# Patient Record
Sex: Female | Born: 1990 | Race: White | Hispanic: No | Marital: Single
Health system: Southern US, Community
[De-identification: ages and names within clinical notes are randomized; demographics above are authoritative.]

## PROBLEM LIST (undated history)

## (undated) DIAGNOSIS — K649 Unspecified hemorrhoids: Secondary | ICD-10-CM

## (undated) DIAGNOSIS — D649 Anemia, unspecified: Secondary | ICD-10-CM

## (undated) DIAGNOSIS — F32A Depression, unspecified: Secondary | ICD-10-CM

## (undated) DIAGNOSIS — Z8759 Personal history of other complications of pregnancy, childbirth and the puerperium: Secondary | ICD-10-CM

## (undated) DIAGNOSIS — N390 Urinary tract infection, site not specified: Secondary | ICD-10-CM

## (undated) DIAGNOSIS — R519 Headache, unspecified: Secondary | ICD-10-CM

## (undated) DIAGNOSIS — N200 Calculus of kidney: Secondary | ICD-10-CM

## (undated) HISTORY — DX: Depression, unspecified: F32.A

## (undated) HISTORY — DX: Headache, unspecified: R51.9

## (undated) HISTORY — PX: NO PAST SURGERIES: SHX2092

## (undated) HISTORY — DX: Calculus of kidney: N20.0

---
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
## 2010-07-23 ENCOUNTER — Emergency Department (HOSPITAL_COMMUNITY)
Admission: EM | Admit: 2010-07-23 | Discharge: 2010-07-23 | Payer: Self-pay | Source: Home / Self Care | Admitting: Emergency Medicine

## 2010-09-14 LAB — POCT RAPID STREP A (OFFICE): Streptococcus, Group A Screen (Direct): NEGATIVE

## 2010-09-14 LAB — POCT URINALYSIS DIP (DEVICE)
Glucose, UA: NEGATIVE mg/dL
Hgb urine dipstick: NEGATIVE
Ketones, ur: NEGATIVE mg/dL
pH: 7 (ref 5.0–8.0)

## 2010-09-14 LAB — POCT PREGNANCY, URINE: Preg Test, Ur: NEGATIVE

## 2010-10-04 LAB — POCT RAPID STREP A (OFFICE): Streptococcus, Group A Screen (Direct): NEGATIVE

## 2010-11-08 NOTE — Discharge Summary (Signed)
Abigail Gray, SIEGRIST               ACCOUNT NO.:  [id number]   MEDICAL RECORD NO.:  [id number]          PATIENT TYPE:  INP   LOCATION:  6118                         FACILITY:  [facility]   PHYSICIAN:  Deanna Artis. Hickling, M.D.DATE OF BIRTH:  1991/02/22   DATE OF ADMISSION:  02/02/2007  DATE OF DISCHARGE:  02/04/2007                               DISCHARGE SUMMARY   FINAL DIAGNOSES:  1. Complicated migraine (346.20).  2. Obesity (278.00).  3. Migraine without aura (346.10).   PROCEDURE:  MRI scan of the brain under sedation.   COMPLICATIONS:  Apnea. (See Hospital Course),   HOSPITAL COURSE:  The patient is a 20 year old woman who was admitted to  [facility] with less than 1 hour of confusional state  accompanied by severe frontal headache, chest pain, abdominal pain,  vomiting, and confusion that was part of her aphasia.   The patient had a CT scan of the brain which was normal.  Nonetheless,  she was unable to express herself more than brief phrases.  She had a  mild right central seventh and complained of pain all over.   The patient had initial pH of 7.71 because of hyperventilation.  This  exacerbated her cerebral blood flow.   PAST MEDICAL HISTORY:  1. Urinary tract infection.  2. Morbid obesity.   No prior hospitalizations.   She took no medicines and had allergies to medicines.   She low grade temperature of 99.7.  Noted to have tachycardia and  tachypnea. Her general examination was normal.  Neurologic examination  was affected as noted above.  She had no other focal neurologic  deficits.  It was my impression that the patient had a complicated  migraine and not a stroke.   The patient was treated with Reglan 10 mg, Demerol 50 mg, Depacon 1000  mg, Zofran 8 mg, and this did not quell her symptoms.   When she was given Versed, she began to hypoventilate and became apneic.  She was treated with bag and mask ventilation and did not require  intubation.  She  began to breathe on her own again.  She was treated  then with Nembutal and fentanyl and entered a twilight sleep long enough  to perform an MRI scan of the brain without and with contrast that was  normal.   Later that day, her aphasia was gone.  Her headache persisted, frontally  predominant, pounding and stabbing.  It was quelled  by morphine 5 mg  IV, though she had some nausea.  We treated that with Zofran.   I elected to start her on verapamil 40 mg twice daily. Through the day,  she gradually improved. She had two doses of morphine 3:00 p.m. and then  again at 8:00 p.m.  In the evening, she had ibuprofen and 400 mg and has  had no further pain medicine.   Today she is feeling well.  She does not complain of a headache.  Vital  Signs: Blood pressure 90/64, resting pulse 74, respirations 16,  temperature 36.7, oxygen saturation 95%. Her general examination and  neurologic examination are normal.  PLAN:  1. The patient will be discharged on verapamil 40 mg twice a day.  2. She can take ibuprofen as a rescue drug 400 mg every 4 hours.  I      cautioned her not to take it around-the-clock to avoid rebound.  3. She will come to my office to obtain a headache diary which will be      filled out on a daily basis  and sent to me at the end of every      month for analysis.  4. We will see her again in two month's time in the office, sooner      depending upon clinical need.      Deanna Artis. Sharene Skeans, M.D.  Electronically Signed     [facility]/MEDQ  D:  02/04/2007  T:  02/04/2007  Job:  [id number]

## 2010-11-08 NOTE — H&P (Signed)
NAMEDEVYNNE, Abigail Gray               ACCOUNT NO.:  [id number]   MEDICAL RECORD NO.:  [id number]          PATIENT TYPE:  INP   LOCATION:  6118                         FACILITY:  [facility]   PHYSICIAN:  Deanna Artis. Hickling, M.D.DATE OF BIRTH:  15-Sep-1990   DATE OF ADMISSION:  02/02/2007  DATE OF DISCHARGE:                              HISTORY & PHYSICAL   CHIEF COMPLAINT:  Confusion, headaches, vomiting.   HISTORY OF PRESENT ILLNESS:  A 20 year old with known migraine without  aura, history of recurrent urinary tract infections and a histrionic  response to pain.   The patient was awakened this morning complaining of chest pain.  She  was given Advil by her mother and then rather soon after vomited.  She  entered a confusional state around 10:00 a.m.  She was brought to the  [facility] emergency room around 10/10.  She was evaluated by Dr.  Sampson Goon.  The patient was hyperventilating when she arrived.  Call  was placed to me, and she had evidence of carpal pedal spasm.  Call to  me was placed at 10:57.  CT scan of the brain was reviewed by me and is  normal.  The patient is crying, vomiting, unable to get her words out,  with a mild right central seventh, complaining of pain all over but  particularly in the left occipital region, her stomach and her chest.   PAST MEDICAL HISTORY:  Migraines as a pre-teen, urinary tract infection.  The patient had several visits the emergency room from 2005 to 2007 with  this morbid obesity.  She has had no prior hospitalizations and no  surgery.   PAST SURGICAL HISTORY:  None.   BIRTH HISTORY:  Term infant, normal spontaneous vaginal delivery, normal  growth and development.   CURRENT MEDICATIONS:  None.   ALLERGIES:  None.   IMMUNIZATIONS:  Up-to-date.   REVIEW OF SYSTEMS:  A 12-system review is otherwise negative, except as  noted above.   SOCIAL HISTORY:  The patient is a Holiday representative in high school, a good Consulting civil engineer.  No tobacco, alcohol or  drugs.   EXAMINATION:  VITAL SIGNS:  Today temperature 99.7 rectally, blood  pressure 108/72, resting pulse 122, down from 155; respirations 30,  oxygen saturation 100%.  HEENT:  Ear, nose and throat:  No infections, meningismus, bruits.  LUNGS:  Clear.  HEART:  No murmurs.  Pulses normal.  ABDOMEN:  Protuberant.  Bowel sounds normal, nontender.  EXTREMITIES:  Were normal.  NEUROLOGIC:  Examination awake, able to follow commands and able to name  objects.  She is not able to speak in complete sentences.  She speaks in  brief phrases.  Cranial Nerves:  Round reactive pupils.  Visual fields  full to following fingers.  Symmetric facial strength at that time.  There may have been a mild central seventh at rest, but when she  grimaces her face seems equal.  Tongue is midline.  Motor examination:  She moves all four extremities.  She is moving her fingers.  She does  not drift, when I ask her to keep her arms in  the air.  Sensory  examination:  Withdrawal x4.  I can not test extinguishing to visual or  noxious stimuli.  Because of lack of cooperation, deep tendon reflexes  are diminished.  The patient had equivocal right plantar response and  left flexor plantar response.   IMPRESSION:  Confusional migraine 346.20.  I cannot rule out but doubt a  stroke.   PLAN:  Reglan 10 mg IV, Demerol 50 mg IV, Depacon 1000 mg IV.  If that  fails, we will ask Dr. Gerome Sam of pediatric critical care to help  sedate her for an MRI scan, without and with contrast.   Once we can determine the etiology her dysfunction, I can further give  her appropriate pain medicine.  Sometimes Verapamil can be helpful in a  situation like this.   LABORATORY:  Her laboratory studies are as follows:  Sodium 137,  potassium 3.7, chloride 106, BUN 12, creatinine 0.7, glucose 115, venous  pH 7.7, pCO2 15.9, bicarbonate 20.2, hemoglobin 15.6, hematocrit 46.0.  CBC:  White count 14,000, hemoglobin 14.5, hematocrit  41.9, platelet  count 410,000, MCV 83.3, 77 polys, 17 lymphs, 5 monos.  The differential  adds up to 99.  Absolute neutrophil count is 10,800.  PT 13.5, INR 1.0,  PTT 36, D-dimer 0.25, blood acetaminophen less than 10.  Comprehensive  metabolic panel:  Sodium 135, potassium 3.6, chloride 103, CO2 17,  glucose 104, BUN 11, creatinine 0.78, bilirubin 0.8, alkaline  phosphatase 69, AST 30, ALT 23, total protein 8.3, albumin 4.2, calcium  10.2, ethanol less than 5.      Deanna Artis. Sharene Skeans, M.D.  Electronically Signed     [facility]/MEDQ  D:  02/02/2007  T:  02/03/2007  Job:  [id number]   cc:   Marne A. Milinda Antis, MD
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
## 2010-11-14 ENCOUNTER — Inpatient Hospital Stay (INDEPENDENT_AMBULATORY_CARE_PROVIDER_SITE_OTHER)
Admission: RE | Admit: 2010-11-14 | Discharge: 2010-11-14 | Disposition: A | Discharge status: Home / Self Care | Payer: 59 | Source: Ambulatory Visit | Attending: Emergency Medicine | Admitting: Emergency Medicine

## 2010-11-14 ENCOUNTER — Ambulatory Visit (INDEPENDENT_AMBULATORY_CARE_PROVIDER_SITE_OTHER): Payer: 59

## 2010-11-14 ENCOUNTER — Emergency Department (HOSPITAL_COMMUNITY): Payer: 59

## 2010-11-14 ENCOUNTER — Emergency Department (HOSPITAL_COMMUNITY)
Admission: EM | Admit: 2010-11-14 | Discharge: 2010-11-15 | Disposition: A | Discharge status: Home / Self Care | Payer: 59 | Attending: Emergency Medicine | Admitting: Emergency Medicine

## 2010-11-14 DIAGNOSIS — R10815 Periumbilic abdominal tenderness: Secondary | ICD-10-CM

## 2010-11-14 DIAGNOSIS — R110 Nausea: Secondary | ICD-10-CM | POA: Insufficient documentation

## 2010-11-14 DIAGNOSIS — R1033 Periumbilical pain: Secondary | ICD-10-CM | POA: Insufficient documentation

## 2010-11-14 LAB — POCT URINALYSIS DIP (DEVICE)
Bilirubin Urine: NEGATIVE
Hgb urine dipstick: NEGATIVE
Protein, ur: NEGATIVE mg/dL

## 2010-11-14 LAB — URINALYSIS, ROUTINE W REFLEX MICROSCOPIC
Bilirubin Urine: NEGATIVE
Protein, ur: NEGATIVE mg/dL
Specific Gravity, Urine: 1.008 (ref 1.005–1.030)
Urobilinogen, UA: 0.2 mg/dL (ref 0.0–1.0)
pH: 7 (ref 5.0–8.0)

## 2010-11-14 LAB — DIFFERENTIAL
Eosinophils Absolute: 0.1 10*3/uL (ref 0.0–0.7)
Eosinophils Relative: 1 % (ref 0–5)
Lymphocytes Relative: 26 % (ref 12–46)
Lymphs Abs: 2.1 10*3/uL (ref 0.7–4.0)
Monocytes Absolute: 0.5 10*3/uL (ref 0.1–1.0)
Neutro Abs: 5.2 10*3/uL (ref 1.7–7.7)
Neutrophils Relative %: 66 % (ref 43–77)

## 2010-11-14 LAB — COMPREHENSIVE METABOLIC PANEL
ALT: 15 U/L (ref 0–35)
Alkaline Phosphatase: 80 U/L (ref 39–117)
CO2: 24 mEq/L (ref 19–32)
Creatinine, Ser: 0.63 mg/dL (ref 0.4–1.2)
GFR calc Af Amer: >60 mL/min (ref >60)
Potassium: 3.4 mEq/L — ABNORMAL LOW (ref 3.5–5.1)
Sodium: 141 mEq/L (ref 135–145)
Total Bilirubin: 0.1 mg/dL — ABNORMAL LOW (ref 0.3–1.2)

## 2010-11-14 LAB — CBC
HCT: 43.6 % (ref 36.0–46.0)
Hemoglobin: 14.9 g/dL (ref 12.0–15.0)
MCH: 28.9 pg (ref 26.0–34.0)
MCHC: 34.2 g/dL (ref 30.0–36.0)
MCV: 84.7 fL (ref 78.0–100.0)
WBC: 7.9 10*3/uL (ref 4.0–10.5)

## 2010-11-14 LAB — PREGNANCY, URINE: Preg Test, Ur: NEGATIVE

## 2010-11-15 ENCOUNTER — Encounter (HOSPITAL_COMMUNITY): Payer: Self-pay | Admitting: Radiology

## 2010-11-15 LAB — WET PREP, GENITAL

## 2010-11-15 MED ORDER — IOHEXOL 300 MG/ML  SOLN
100.0000 mL | Freq: Once | INTRAMUSCULAR | Status: AC | PRN
  Administered 2010-11-15: 100 mL via INTRAVENOUS

## 2010-11-16 LAB — GC/CHLAMYDIA PROBE AMP, GENITAL: Chlamydia, DNA Probe: NEGATIVE

## 2011-03-17 LAB — POCT URINALYSIS DIP (DEVICE)
Glucose, UA: NEGATIVE
Nitrite: NEGATIVE
Operator id: 116391
Specific Gravity, Urine: 1.015
Urobilinogen, UA: 0.2

## 2011-03-17 LAB — POCT PREGNANCY, URINE
Operator id: 247071
Preg Test, Ur: NEGATIVE

## 2011-03-20 LAB — POCT URINALYSIS DIP (DEVICE)
Ketones, ur: 15 — AB
Operator id: 239701
Protein, ur: 100 — AB
Specific Gravity, Urine: >=1.03
Urobilinogen, UA: 0.2
pH: 6

## 2011-03-27 LAB — TYPE AND SCREEN: Antibody Screen: NEGATIVE

## 2011-03-27 LAB — URINALYSIS, ROUTINE W REFLEX MICROSCOPIC
Glucose, UA: NEGATIVE
Leukocytes, UA: NEGATIVE
Nitrite: NEGATIVE
Protein, ur: 100 — AB
Urobilinogen, UA: 0.2

## 2011-03-27 LAB — PROTIME-INR: INR: 1

## 2011-03-27 LAB — RAPID URINE DRUG SCREEN, HOSP PERFORMED
Amphetamines: NOT DETECTED
Benzodiazepines: NOT DETECTED
Cocaine: NOT DETECTED
Opiates: NOT DETECTED
Tetrahydrocannabinol: POSITIVE — AB

## 2011-03-27 LAB — DIFFERENTIAL
Basophils Absolute: 0
Basophils Absolute: 0
Basophils Relative: 0
Basophils Relative: 0
Lymphocytes Relative: 16 — ABNORMAL LOW
Lymphocytes Relative: 25
Monocytes Absolute: 0.6
Monocytes Relative: 5
Neutro Abs: 6.6
Neutro Abs: 9.8 — ABNORMAL HIGH
Neutrophils Relative %: 78 — ABNORMAL HIGH

## 2011-03-27 LAB — COMPREHENSIVE METABOLIC PANEL
Albumin: 4.3
Alkaline Phosphatase: 66
BUN: 5 — ABNORMAL LOW
Creatinine, Ser: 0.77
Glucose, Bld: 89
Total Protein: 7.9

## 2011-03-27 LAB — CBC
HCT: 41.4
Hemoglobin: 14
MCHC: 33.9
MCV: 90.9
Platelets: 274
Platelets: 318
RDW: 13.1
RDW: 13.5

## 2011-03-27 LAB — URINE MICROSCOPIC-ADD ON

## 2011-04-03 LAB — POCT URINALYSIS DIP (DEVICE)
Glucose, UA: NEGATIVE
Nitrite: POSITIVE — AB
Operator id: 235561
Protein, ur: 30 — AB
Urobilinogen, UA: 0.2

## 2011-04-03 LAB — POCT PREGNANCY, URINE: Preg Test, Ur: NEGATIVE

## 2011-04-03 LAB — URINALYSIS, ROUTINE W REFLEX MICROSCOPIC
Glucose, UA: NEGATIVE
Nitrite: POSITIVE — AB
Protein, ur: NEGATIVE
Urobilinogen, UA: 0.2

## 2011-04-03 LAB — URINE CULTURE: Colony Count: >=100000

## 2011-04-03 LAB — URINE MICROSCOPIC-ADD ON

## 2011-04-06 LAB — POCT URINALYSIS DIP (DEVICE)
Operator id: 247071
Protein, ur: NEGATIVE
Urobilinogen, UA: 0.2

## 2011-04-06 LAB — POCT PREGNANCY, URINE: Preg Test, Ur: NEGATIVE

## 2011-04-07 LAB — POCT URINALYSIS DIP (DEVICE)
Hgb urine dipstick: NEGATIVE
Nitrite: NEGATIVE
Specific Gravity, Urine: 1.025
Urobilinogen, UA: 0.2
pH: 6

## 2011-04-07 LAB — URINE CULTURE: Colony Count: 3000

## 2011-04-10 LAB — DIFFERENTIAL
Eosinophils Relative: 0
Lymphocytes Relative: 17 — ABNORMAL LOW
Lymphs Abs: 2.4
Monocytes Absolute: 0.7
Monocytes Relative: 5
Neutro Abs: 10.8 — ABNORMAL HIGH

## 2011-04-10 LAB — URINE CULTURE: Colony Count: 40000

## 2011-04-10 LAB — POCT I-STAT CREATININE
Creatinine, Ser: 0.7
Operator id: 291361

## 2011-04-10 LAB — ETHANOL: Alcohol, Ethyl (B): <5

## 2011-04-10 LAB — URINALYSIS, ROUTINE W REFLEX MICROSCOPIC
Bilirubin Urine: NEGATIVE
Hgb urine dipstick: NEGATIVE
Nitrite: NEGATIVE
Protein, ur: 100 — AB
Specific Gravity, Urine: 1.021
Urobilinogen, UA: 1

## 2011-04-10 LAB — CBC
MCV: 83.3
Platelets: 410 — ABNORMAL HIGH
RDW: 13.3
WBC: 14 — ABNORMAL HIGH

## 2011-04-10 LAB — COMPREHENSIVE METABOLIC PANEL
AST: 30
Albumin: 4.2
BUN: 11
Chloride: 103
Creatinine, Ser: 0.78
Total Bilirubin: 0.8
Total Protein: 8.3

## 2011-04-10 LAB — I-STAT 8, (EC8 V) (CONVERTED LAB)
Acid-Base Excess: 4 — ABNORMAL HIGH
BUN: 12
Bicarbonate: 20.2
Chloride: 106
Glucose, Bld: 115 — ABNORMAL HIGH
HCT: 46
Hemoglobin: 15.6
Operator id: 291361
Potassium: 3.7
Sodium: 137
TCO2: 21
pCO2, Ven: 15.9 — ABNORMAL LOW
pH, Ven: 7.711

## 2011-04-10 LAB — RAPID URINE DRUG SCREEN, HOSP PERFORMED
Amphetamines: NOT DETECTED
Barbiturates: NOT DETECTED
Benzodiazepines: NOT DETECTED
Tetrahydrocannabinol: NOT DETECTED

## 2011-04-10 LAB — URINE MICROSCOPIC-ADD ON

## 2011-04-10 LAB — PROTIME-INR: INR: 1

## 2011-04-10 LAB — APTT: aPTT: 36

## 2011-11-25 ENCOUNTER — Encounter (HOSPITAL_COMMUNITY): Payer: Self-pay

## 2011-11-25 ENCOUNTER — Emergency Department (HOSPITAL_COMMUNITY)
Admission: EM | Admit: 2011-11-25 | Discharge: 2011-11-25 | Disposition: A | Discharge status: Home / Self Care | Payer: Self-pay | Attending: Emergency Medicine | Admitting: Emergency Medicine

## 2011-11-25 DIAGNOSIS — F17200 Nicotine dependence, unspecified, uncomplicated: Secondary | ICD-10-CM | POA: Insufficient documentation

## 2011-11-25 DIAGNOSIS — N61 Mastitis without abscess: Secondary | ICD-10-CM | POA: Insufficient documentation

## 2011-11-25 MED ORDER — CEPHALEXIN 500 MG PO CAPS: 500.0000 mg | ORAL_CAPSULE | Freq: Four times a day (QID) | ORAL | Status: AC

## 2011-11-25 MED ORDER — HYDROCODONE-ACETAMINOPHEN 5-325 MG PO TABS: 1.0000 | ORAL_TABLET | Freq: Four times a day (QID) | ORAL | Status: AC | PRN

## 2011-11-25 MED ORDER — IBUPROFEN 800 MG PO TABS: 800.0000 mg | ORAL_TABLET | Freq: Three times a day (TID) | ORAL | Status: AC | PRN

## 2011-11-25 NOTE — Discharge Instructions (Signed)
Read the information below.  Take the antibiotics until they are gone, even if your infection is improved. Return to the ER immediately if you develop redness, swelling, pus draining from the wound, or fevers greater than 100.4.  You may return to the ER at any time for worsening condition or any new symptoms that concern you.      If you have no primary doctor, here are some resources that may be helpful:  Medicaid-accepting [facility] Providers:   - [facility]- [address]      [phone]      Mon-Fri 9am-7pm, Sat 9am-1pm   - [facility]- [address] [place], [place]      [phone]   - [facility]- [address] [place]      [phone]   [facility]- [address]      [phone]   - Renaye Rakers- [address] [place], [address]      [phone]      Only accepts [place] patients       after they have her name applied to their card   Self Pay (no insurance) in [place]:   - Sickle Cell Patients: Dr Willey Blade, [facility]      [address] [place]      [phone]   - Health [phone]   - Physician Referral Service- [phone]   - [facility]- [address] [place]      [phone]   [facility] [place]- [address]   - [facility]- see information above      (Speak to [employer] if you do not have insurance)   - [facility]- [address] [place]      [phone]   - [facility] [place]- [address]      [phone]   - [facility]- [address]      [phone]   - Dr Julio Sicks-  [address], [place]      [phone]   - [facility]- [address]      [phone]   - [facility]- [address]      [phone]      Also [address]      [phone]   - [facility]- [address]      [phone]      1st and 3rd Saturday every month, 10am-1pm Other agencies that provide inexpensive medical care:    [facility]  [phone]    [facility]  [phone]    [facility]  [phone]    [facility]  [phone]    [facility]  [phone]  General Information: Finding a doctor when you do not have health insurance can be tricky. Although you are not limited by an insurance plan, you are of course limited by her finances and how much but he can pay out of pocket.  What are your options if you don't have health insurance?   1) Find a Librarian, academic and Pay Out of Pocket Although you won't have to find out who is covered by your insurance plan, it is a good idea to ask around and get recommendations. You will then need to call  the office and see if the doctor you have chosen will accept you as a new patient and what types of options they offer for patients who are self-pay. Some doctors offer discounts or will set up payment plans for their patients who do not have insurance, but you will need to ask so you aren't surprised when you get to your appointment.  2) Contact Your Local Health Department Not all health departments have doctors that can see patients for sick visits, but many do, so it is worth a call to see if yours does. If you don't know where your local health department is, you can check in your phone book. The CDC also has a tool to help you locate your state's health department, and many state websites also have listings of all of their local health departments.  3) Find a Walk-in Clinic If your illness is not likely to be very severe or complicated, you may want to try a walk in clinic. These are popping up all over the country in pharmacies, drugstores, and shopping centers. They're usually staffed by nurse practitioners or physician assistants that have been trained to treat common illnesses and complaints. They're usually  fairly quick and inexpensive. However, if you have serious medical issues or chronic medical problems, these are probably not your best option   RESOURCE GUIDE  Chronic Pain Problems: Contact [facility]  [phone] Patients need to be referred by their primary care doctor.  Insufficient Money for Medicine: Contact United Way:  call "211" or [facility] [phone].  No Primary Care Doctor: - Call [facility] Connect  [phone] - can help you locate a primary care doctor that  accepts your insurance, provides certain services, etc. - Physician Referral [phone]  Agencies that provide inexpensive medical care: - [facility]  [phone] - [facility]  [phone] - [facility]  [phone] [facility]  [phone] - [facility] Parenthood  [phone] [facility]  [phone]  Medicaid-accepting [facility] Providers: - [facility]- [address]  [phone], Mon-Fri 9am-7pm, Sat 9am-1pm - [facility]- [address] [place], [address] [place]  [phone] - [facility]- [address], [address] [place]  [phone] [facility]- [address]  [phone] - Renaye Rakers- [address] [place], [address], [phone]  Only accepts [place] patients after they have their name  applied to their card  Self Pay (no insurance) in [place]: - Sickle Cell Patients: Dr Willey Blade, [facility]  [address] [place], [phone] - [facility]- [address] [place]  [phone]       [facility] [place]- [address]       -     [facility]- see information above (Speak to [employer] if you do not have insurance)       -  [facility]- [address] [place], [phone]       -  [facility]- [address],  [phone]       -  [facility]- [address] [phone]       -  Dr Julio Sicks-  [address], [place], [phone]       -  [facility]-  [address] [phone]       -  [facility]- [address], [phone], also [address] [phone]       -    [facility]- [address] [place], [phone], 1st & 3rd Saturday   every month, 10am-1pm  1) Find a Doctor and Pay Out of Pocket Although you won't have to find out who is covered by your insurance plan, it is a good idea to ask around and get recommendations. You will then need to call the office and see if the doctor you have chosen will accept you as a new patient and what types of options they offer for patients who are self-pay. Some doctors offer discounts or will set up payment plans for their patients who do not have insurance, but you will need to ask so you aren't surprised when you get to your appointment.  2) Contact Your Local Health Department Not all health departments have doctors that can see patients for sick visits, but many do, so it is worth a call to see if yours does. If you don't know where your local health department is, you can check in your phone book. The CDC also has a tool to help you locate your state's health department, and many state websites also have listings of all of their local health departments.  3) Find a Walk-in Clinic If your illness is not likely to be very severe or complicated, you may want to try a walk in clinic. These are popping up all over the country in pharmacies, drugstores, and shopping centers. They're usually staffed by nurse practitioners or physician assistants that have been trained to treat common illnesses and complaints. They're usually fairly quick and inexpensive. However, if you have serious medical issues or chronic medical problems, these are probably not your best option  STD Testing - [facility] Department of [facility],  STD Clinic, [address] [place], phone [phone] or [phone].  Monday - Friday, call for an appointment. [facility] Department of [employer], STD Clinic, [address], [place], phone [phone] or [phone].  Monday - Friday, call for an appointment.  Abuse/Neglect: [facility] Child Abuse Hotline [phone] [facility] Child Abuse Hotline 862-170-2554 (After Hours)  Emergency Shelter:  [facility] (437)063-1567  Maternity Homes: - [facility] [place] [facility] [phone] - [facility] (435)[phone]  MRSA Hotline #:   413-877-2703  [facility] Resources  [facility]  United [employer] [facility] Dept. [address]                 [address]         [address] [place] [address]  [place]                                               Cristobal Goldmann Phone:  [phone]  Phone:  [phone]                   Phone:  [phone]  [facility], [phone] - [phone]       -     [facility] [place] in [place], [address],                                  [phone], [facility] Child Abuse Hotline 336-203-7024 or 8648655926 (After Hours)   Behavioral Health Services  Substance Abuse Resources: - [facility] Drug [facility]  (832)261-8273 - [facility] (206)178-7235 - [facility] [place] [phone] Floydene Flock 813-390-2020 - Residential & Outpatient Substance Abuse Program  [phone]  Psychological Services: [facility]  [phone] [facility]  [phone] - [facility], [phone] [place]. [address] [place], ACCESS LINE: 801-852-6246 or [phone], [employer]  Dental  Assistance  If unable to pay or uninsured, contact:  [facility] or [facility]. to become qualified for the adult dental clinic.  Patients with Medicaid: [facility] [phone] [address], [phone] [address] [phone]  If unable to pay, or uninsured, contact [facility] [phone]) or [facility] Department [phone] in [place], [phone] in [facility]) to become qualified for the adult dental clinic  Other Low-Cost Community Dental Services: - [facility]- [address] [place], [place], [place], [address], [phone], [address], 2nd and 4th Thursday of the month at 6:30am.  10 clients each day by appointment, can sometimes see walk-in patients if someone does not show for an appointment. [facility]- [address] [place], [place], [address], [phone] - [facility]- [address] [place], [place], [address], [phone] - [facility]- [phone] [facility]- [phone] [facility] Department- [phone]

## 2011-11-25 NOTE — ED Notes (Signed)
sts knot on skin under left breast for 3 weeks

## 2011-11-25 NOTE — ED Provider Notes (Signed)
History   This chart was scribed for Hilario Quarry, MD by Toya Smothers. The patient was seen in room STRE2/STRE2. Patient's care was started at 1818.  CSN: [id number]  Arrival date & time 11/25/11  1818   None     Chief Complaint  Patient presents with  . Cyst   The history is provided by the patient. No language interpreter was used.    Abigail Gray is a 21 y.o. female who presents to the Emergency Department complaining of sudden onset constant moderate severe knott on left breast onset 3 weeks ago with associate rash onset 1 day ago. Pt states that the knott appeared suddenly. Pt states that she is sexually active, not taking birth control, currently not taking any other OTC drugs or prescribed medications.   History reviewed. No pertinent past medical history.  History reviewed. No pertinent past surgical history.  History reviewed. No pertinent family history.  History  Substance Use Topics  . Smoking status: Current Everyday Smoker  . Smokeless tobacco: Not on file  . Alcohol Use: No   Review of Systems  Constitutional: Negative for fever and chills.  Respiratory: Negative for shortness of breath.   Gastrointestinal: Negative for nausea and vomiting.  Musculoskeletal:       Tenderness L breast.  Skin:       Knott under L breast.  Neurological: Negative for weakness.    Allergies  Shellfish allergy  Home Medications  No current outpatient prescriptions on file.  BP 144/78  Pulse 81  Temp 98.5 F (36.9 C)  Resp 16  SpO2 99%  Physical Exam  Nursing note and vitals reviewed. Constitutional: She is oriented to person, place, and time. She appears well-developed and well-nourished. No distress.  HENT:  Head: Normocephalic and atraumatic.  Eyes: EOM are normal. Pupils are equal, round, and reactive to light.  Neck: Neck supple. No tracheal deviation present.  Cardiovascular: Normal rate.   Pulmonary/Chest: Effort normal. No respiratory distress.    Abdominal: Soft. She exhibits no distension.  Musculoskeletal: Normal range of motion. She exhibits no edema.       Abscess on L brest   Neurological: She is alert and oriented to person, place, and time. No sensory deficit.  Skin: Skin is warm and dry.  Psychiatric: She has a normal mood and affect. Her behavior is normal.    ED Course  Procedures (including critical care time) DIAGNOSTIC STUDIES: Oxygen Saturation is 99% on room air, normal by my interpretation.    COORDINATION OF CARE: 7:06PM- Evaluated state of Pt's present illness.  WEST,EMILY B Patient was moved to CDU for I&D of left breast abscess.  Sign out received from Dr Rosalia Hammers.  Patient to have I&D in ED, abscess felt superficially by Dr Rosalia Hammers.  On my exam, pt does have small area of induration and surrounding erythema. Discussed risks and alternatives with patient, including large scarring to result.  Pt decided to proceed with I&D as she does not have insurance and will not be able to follow up with surgery.   INCISION AND DRAINAGE Performed by: Rise Patience Consent: Verbal consent obtained. Risks and benefits: risks, benefits and alternatives were discussed Type: abscess  Body area: left breast  Anesthesia: local infiltration  Local anesthetic: lidocaine 2% no epinephrine  Anesthetic total: 5 ml  Drainage: sanguinous  Drainage amount: small  Packing material: none.    No apparent collection underneath.  Wound edges approximated with steri-strips.    Patient tolerance: Patient tolerated  the procedure well with no immediate complications.  Given lack of evidence of any collection of purulent material or abscess within the breast, I chose to close the incision with steri-strips to prevent gaping wound or worsening infection.  Wound care instructions discussed with patient.  Return precautions discussed with patient.  Patient verbalizes understanding and agrees with plan.    End section by WEST,EMILY B      Labs Reviewed - No data to display No results found.  No diagnosis found.   MDM  I personally performed the services described in this documentation, which was scribed in my presence. The recorded information has been reviewed and considered.  I performed a history and physical examination of Abigail Gray and discussed her management with Ms. Chad .  I agree with the history, physical, assessment, and plan of care, with the following exceptions: None  I was present for the following procedures: None Time Spent in Critical Care of the patient: None Time spent in discussions with the patient and family:10 Abigail Honda [place] Leak, MD 12/02/11 1755

## 2011-12-01 ENCOUNTER — Emergency Department (HOSPITAL_COMMUNITY): Payer: Self-pay

## 2011-12-01 ENCOUNTER — Encounter (HOSPITAL_COMMUNITY): Payer: Self-pay | Admitting: Emergency Medicine

## 2011-12-01 ENCOUNTER — Emergency Department (HOSPITAL_COMMUNITY)
Admission: EM | Admit: 2011-12-01 | Discharge: 2011-12-01 | Disposition: A | Discharge status: Home / Self Care | Payer: Self-pay | Attending: Emergency Medicine | Admitting: Emergency Medicine

## 2011-12-01 DIAGNOSIS — R112 Nausea with vomiting, unspecified: Secondary | ICD-10-CM | POA: Insufficient documentation

## 2011-12-01 DIAGNOSIS — R109 Unspecified abdominal pain: Secondary | ICD-10-CM

## 2011-12-01 DIAGNOSIS — R197 Diarrhea, unspecified: Secondary | ICD-10-CM | POA: Insufficient documentation

## 2011-12-01 DIAGNOSIS — R1031 Right lower quadrant pain: Secondary | ICD-10-CM | POA: Insufficient documentation

## 2011-12-01 DIAGNOSIS — N61 Mastitis without abscess: Secondary | ICD-10-CM | POA: Insufficient documentation

## 2011-12-01 LAB — COMPREHENSIVE METABOLIC PANEL
BUN: 10 mg/dL (ref 6–23)
Calcium: 9.5 mg/dL (ref 8.4–10.5)
GFR calc Af Amer: >90 mL/min (ref >90)
Glucose, Bld: 94 mg/dL (ref 70–99)
Sodium: 139 mEq/L (ref 135–145)
Total Protein: 8 g/dL (ref 6.0–8.3)

## 2011-12-01 LAB — CBC
HCT: 42.5 % (ref 36.0–46.0)
Hemoglobin: 14.6 g/dL (ref 12.0–15.0)
MCH: 29 pg (ref 26.0–34.0)
MCHC: 34.4 g/dL (ref 30.0–36.0)

## 2011-12-01 LAB — URINALYSIS, ROUTINE W REFLEX MICROSCOPIC
Ketones, ur: NEGATIVE mg/dL
Leukocytes, UA: NEGATIVE
Nitrite: NEGATIVE
Protein, ur: NEGATIVE mg/dL

## 2011-12-01 LAB — LIPASE, BLOOD: Lipase: 30 U/L (ref 11–59)

## 2011-12-01 MED ORDER — ONDANSETRON HCL 4 MG/2ML IJ SOLN: 4.0000 mg | Freq: Once | INTRAMUSCULAR | Status: AC

## 2011-12-01 MED ORDER — IOHEXOL 300 MG/ML  SOLN
20.0000 mL | INTRAMUSCULAR | Status: AC
  Administered 2011-12-01 (×2): 20 mL via ORAL

## 2011-12-01 MED ORDER — SODIUM CHLORIDE 0.9 % IV BOLUS (SEPSIS)
1000.0000 mL | Freq: Once | INTRAVENOUS | Status: AC
  Administered 2011-12-01: 1000 mL via INTRAVENOUS

## 2011-12-01 MED ORDER — PROMETHAZINE HCL 25 MG PO TABS: 25.0000 mg | ORAL_TABLET | Freq: Four times a day (QID) | ORAL | Status: DC | PRN

## 2011-12-01 MED ORDER — IOHEXOL 300 MG/ML  SOLN
100.0000 mL | Freq: Once | INTRAMUSCULAR | Status: AC | PRN
  Administered 2011-12-01: 100 mL via INTRAVENOUS

## 2011-12-01 MED ORDER — HYDROMORPHONE HCL PF 1 MG/ML IJ SOLN: 1.0000 mg | Freq: Once | INTRAMUSCULAR | Status: DC

## 2011-12-01 MED ORDER — ONDANSETRON HCL 4 MG/2ML IJ SOLN
4.0000 mg | Freq: Once | INTRAMUSCULAR | Status: AC
  Administered 2011-12-01: 4 mg via INTRAVENOUS
  Filled 2011-12-01: qty 2

## 2011-12-01 MED ORDER — HYDROMORPHONE HCL PF 1 MG/ML IJ SOLN
1.0000 mg | Freq: Once | INTRAMUSCULAR | Status: AC
  Administered 2011-12-01: 1 mg via INTRAVENOUS
  Filled 2011-12-01: qty 1

## 2011-12-01 NOTE — ED Provider Notes (Signed)
History     CSN: [id number]  Arrival date & time 12/01/11  0011   First MD Initiated Contact with Patient 12/01/11 0051      Chief Complaint  Patient presents with  . Emesis     The history is provided by the patient and medical records.   the patient was recently seen for left superior breast abscess and cellulitis status post incision and drainage and antibiotics.  She reports significant improvement in her erythema and no discomfort in her left breast.  She presents today because of severe nausea and vomiting without diarrhea that began today.  She has not taken any of the Vicodin for pain.  She continues to take the Keflex and has only several days left.  She reports no dysuria or urinary frequency.  She reports diffuse generalized abdominal pain.  She has no vaginal complaints.  She denies flank pain.  Her pain is mild to moderate at this time.  History reviewed. No pertinent past medical history.  History reviewed. No pertinent past surgical history.  No family history on file.  History  Substance Use Topics  . Smoking status: Current Everyday Smoker  . Smokeless tobacco: Not on file  . Alcohol Use: No    OB History    Grav Para Term Preterm Abortions TAB SAB Ect Mult Living                  Review of Systems  All other systems reviewed and are negative.    Allergies  Shellfish allergy  Home Medications   Current Outpatient Rx  Name Route Sig Dispense Refill  . CEPHALEXIN 500 MG PO CAPS Oral Take 1 capsule (500 mg total) by mouth 4 (four) times daily. 28 capsule 0  . HYDROCODONE-ACETAMINOPHEN 5-325 MG PO TABS Oral Take 1 tablet by mouth every 6 (six) hours as needed for pain. 15 tablet 0  . IBUPROFEN 800 MG PO TABS Oral Take 1 tablet (800 mg total) by mouth every 8 (eight) hours as needed for pain. 21 tablet 0    BP 141/96  Pulse 84  Temp(Src) 98.6 F (37 C) (Oral)  Resp 14  SpO2 100%  LMP 11/23/2011  Physical Exam  Nursing note and vitals  reviewed. Constitutional: She is oriented to person, place, and time. She appears well-developed and well-nourished. No distress.  HENT:  Head: Normocephalic and atraumatic.  Eyes: EOM are normal.  Neck: Normal range of motion.  Cardiovascular: Normal rate, regular rhythm and normal heart sounds.   Pulmonary/Chest: Effort normal and breath sounds normal.       Healing left superior breast cellulitis and abscess.  No erythema present. Chaperone present  Abdominal: Soft. She exhibits no distension.       Mild tenderness in the right lower quadrant without guarding or rebound.  Mild tenderness in the right upper quadrant without guarding or rebound  Musculoskeletal: Normal range of motion.  Neurological: She is alert and oriented to person, place, and time.  Skin: Skin is warm and dry.  Psychiatric: She has a normal mood and affect. Judgment normal.    ED Course  Procedures (including critical care time)  Labs Reviewed  URINALYSIS, ROUTINE W REFLEX MICROSCOPIC - Abnormal; Notable for the following:    APPearance HAZY (*)    All other components within normal limits  COMPREHENSIVE METABOLIC PANEL - Abnormal; Notable for the following:    Total Bilirubin 0.2 (*)    All other components within normal limits  PREGNANCY, URINE  CBC  LIPASE, BLOOD   Ct Abdomen Pelvis W Contrast  12/01/2011  *RADIOLOGY REPORT*  Clinical Data: Nausea and vomiting.  CT ABDOMEN AND PELVIS WITH CONTRAST  Technique:  Multidetector CT imaging of the abdomen and pelvis was performed following the standard protocol during bolus administration of intravenous contrast.  Contrast: 1 OMNIPAQUE IOHEXOL 300 MG/ML  SOLN, OMNIPAQUE IOHEXOL 300 MG/ML  SOLN  Comparison: 11/15/2010.  Findings: The lung bases are clear.  Minimal dependent atelectasis.  The solid abdominal organs are normal.  The gallbladder is contracted.  No common bile duct dilatation.  The stomach, duodenum, small bowel and colon are unremarkable.  No  inflammatory changes or mass lesions.  The appendix is normal.  No mesenteric or retroperitoneal masses or adenopathy.  Small scattered lymph nodes are noted.  The aorta is normal in caliber. The branch vessels are patent.  The portal and splenic veins are patent.  The uterus and ovaries are unremarkable.  There is a small simple appearing 18 mm left paraovarian cyst or inclusion cyst.  The bladder is normal.  No pelvic mass or adenopathy.  There is a small amount of free pelvic fluid.  No inguinal mass or hernia.  The bony structures are intact.  IMPRESSION: No acute abdominal/pelvic findings, mass lesions or adenopathy.  Original Report Authenticated By: P. Loralie Champagne, M.D.     1. Nausea vomiting and diarrhea   2. Abdominal pain       MDM  Right-sided abdominal tenderness.  Labs and urine pending at this time.  The patient will require  imaging this evening.   3:22 AM Patient is feeling better at this time.  Discharge home in good condition.  Home with antinausea medicine.  She has pain medicine at home.  She understands the importance of close PCP followup.  She will return to the ER for new or worsening symptoms       Lyanne Co, MD 12/01/11 [phone]

## 2011-12-01 NOTE — ED Notes (Signed)
Patient returned from CT

## 2011-12-01 NOTE — ED Notes (Signed)
PT. REPORTS NAUSEA AND VOMITTING WITH HEADACHE , BACK PAIN , ABDOMINAL PAIN AND DIZZINESS , STATES CURRENTLY TAKING KEFLEX FOR LEFT BREAST ABSCESS.

## 2011-12-01 NOTE — ED Notes (Signed)
Patient transported to CT 

## 2012-01-16 ENCOUNTER — Encounter (HOSPITAL_COMMUNITY): Payer: Self-pay

## 2012-01-16 ENCOUNTER — Emergency Department (HOSPITAL_COMMUNITY)
Admission: EM | Admit: 2012-01-16 | Discharge: 2012-01-16 | Disposition: A | Discharge status: Home / Self Care | Payer: Self-pay | Attending: Emergency Medicine | Admitting: Emergency Medicine

## 2012-01-16 DIAGNOSIS — F17200 Nicotine dependence, unspecified, uncomplicated: Secondary | ICD-10-CM | POA: Insufficient documentation

## 2012-01-16 DIAGNOSIS — N390 Urinary tract infection, site not specified: Secondary | ICD-10-CM | POA: Insufficient documentation

## 2012-01-16 LAB — URINE MICROSCOPIC-ADD ON

## 2012-01-16 LAB — URINALYSIS, ROUTINE W REFLEX MICROSCOPIC
Nitrite: POSITIVE — AB
Specific Gravity, Urine: 1.014 (ref 1.005–1.030)
Urobilinogen, UA: 0.2 mg/dL (ref 0.0–1.0)

## 2012-01-16 MED ORDER — PROMETHAZINE HCL 25 MG PO TABS: 25.0000 mg | ORAL_TABLET | Freq: Four times a day (QID) | ORAL | Status: DC | PRN

## 2012-01-16 MED ORDER — HYDROCODONE-ACETAMINOPHEN 5-325 MG PO TABS: 2.0000 | ORAL_TABLET | ORAL | Status: DC | PRN

## 2012-01-16 MED ORDER — IBUPROFEN 400 MG PO TABS
800.0000 mg | ORAL_TABLET | Freq: Once | ORAL | Status: AC
  Administered 2012-01-16: 800 mg via ORAL
  Filled 2012-01-16: qty 2

## 2012-01-16 MED ORDER — SULFAMETHOXAZOLE-TRIMETHOPRIM 800-160 MG PO TABS: 1.0000 | ORAL_TABLET | Freq: Two times a day (BID) | ORAL | Status: DC

## 2012-01-16 NOTE — ED Notes (Signed)
Pt on abx for dental infection sts now may have yeast infection and uti.

## 2012-01-16 NOTE — ED Provider Notes (Signed)
History     CSN: [id number]  Arrival date & time 01/16/12  0948   None     Chief Complaint  Patient presents with  . Urinary Tract Infection  . Vaginitis    (Consider location/radiation/quality/duration/timing/severity/associated sxs/prior treatment) HPI Comments: Patient presents with back pain and urinary urgency. She says her symptoms started about 4 days ago with urinary urgency, increased urinary frequency and back pain. Her back pain is located in her mid back, does not radiate and is sharp in nature. The patient rates the pain 6/10 currently. She believes she has a UTI from taking antibiotics that she was prescribed for a dental infection. She has history of UTIs and this feels "just like a UTI." She has been taking goody powder at home for pain relief which has helped. She denies being sexually active. Admits to associated nausea. She denies fever, chills, vomiting, diarrhea, vaginal discharge, malodorous urine.   Patient is a 21 y.o. female presenting with urinary tract infection.  Urinary Tract Infection Associated symptoms include abdominal pain and nausea. Pertinent negatives include no chills, fever or vomiting.    No past medical history on file.  No past surgical history on file.  No family history on file.  History  Substance Use Topics  . Smoking status: Current Everyday Smoker  . Smokeless tobacco: Not on file  . Alcohol Use: No    OB History    Grav Para Term Preterm Abortions TAB SAB Ect Mult Living                  Review of Systems  Constitutional: Negative for fever and chills.  Gastrointestinal: Positive for nausea and abdominal pain. Negative for vomiting and diarrhea.  Genitourinary: Positive for urgency and frequency. Negative for dysuria, vaginal discharge and vaginal pain.  Musculoskeletal: Positive for back pain.    Allergies  Shellfish allergy  Home Medications   Current Outpatient Rx  Name Route Sig Dispense Refill  .  PROMETHAZINE HCL 25 MG PO TABS Oral Take 25 mg by mouth every 6 (six) hours as needed. Nausea/vomiting.      BP 128/88  Pulse 98  Temp 98.5 F (36.9 C) (Oral)  Resp 16  SpO2 97%  LMP 12/24/2011  Physical Exam  Constitutional: She appears well-developed and well-nourished. She appears distressed.  HENT:  Head: Normocephalic and atraumatic.  Eyes: Conjunctivae are normal.  Neck: Normal range of motion.  Cardiovascular: Normal rate and regular rhythm.  Exam reveals no gallop and no friction rub.   No murmur heard. Pulmonary/Chest: Effort normal and breath sounds normal. She has no wheezes. She has no rales. She exhibits no tenderness.  Abdominal: Soft. She exhibits no mass. There is tenderness. There is no rebound and no guarding.       Suprapubic tenderness on deep palpation.   Genitourinary:       CVA tenderness noted on the right side.   Neurological: She is alert.  Skin: Skin is warm. No rash noted. She is diaphoretic.  Psychiatric: She has a normal mood and affect. Her behavior is normal.    ED Course  Procedures (including critical care time)  Labs Reviewed  URINALYSIS, ROUTINE W REFLEX MICROSCOPIC - Abnormal; Notable for the following:    Color, Urine ORANGE (*)  BIOCHEMICALS MAY BE AFFECTED BY COLOR   Nitrite POSITIVE (*)     All other components within normal limits  URINE MICROSCOPIC-ADD ON - Abnormal; Notable for the following:    Squamous Epithelial /  LPF FEW (*)     All other components within normal limits  URINE CULTURE   No results found.   1. UTI (urinary tract infection)       MDM  10:37 AM Patient appears uncomfortable. I ordered ibuprofen for pain since the patient drove herself to the hospital. Urinalysis results pending. Due to her denying vaginal discharge, vaginal pain and sexual activity, pelvic exam may not be performed.   11:19 AM Urinalysis reveals positive nitrites, no leukocytes, however, the patient describes her symptoms as typical  UTI. Will treat as UTI and give her meds to control pain. She will be discharged home and advised to return if symptoms worsen or new symptoms arise. Plan discussed with Dr. Rhunette Croft who is agreeable.   Patient will be discharged with Bactrim for UTI treatment. In absence of fever, sexual activity, vaginal discharge, patient will not need further evaluation for pyelonephritis, cervicitis, and/or vaginitis. Patient should return to ED if concerning symptoms arise. Plan discussed with Dr. Rhunette Croft who is agreeable.      Emilia Beck, PA-C 01/16/12 1622

## 2012-01-16 NOTE — ED Notes (Signed)
Felicia to help pt set up PCP

## 2012-01-17 LAB — URINE CULTURE

## 2012-01-20 NOTE — ED Provider Notes (Signed)
Medical screening examination/treatment/procedure(s) were performed by non-physician practitioner and as supervising physician I was immediately available for consultation/collaboration.  Derwood Kaplan, MD 01/20/12 1437

## 2012-01-22 ENCOUNTER — Encounter (HOSPITAL_COMMUNITY): Payer: Self-pay | Admitting: Emergency Medicine

## 2012-01-22 ENCOUNTER — Emergency Department (HOSPITAL_COMMUNITY)
Admission: EM | Admit: 2012-01-22 | Discharge: 2012-01-22 | Disposition: A | Discharge status: Home / Self Care | Payer: Self-pay | Attending: Emergency Medicine | Admitting: Emergency Medicine

## 2012-01-22 DIAGNOSIS — R109 Unspecified abdominal pain: Secondary | ICD-10-CM | POA: Insufficient documentation

## 2012-01-22 DIAGNOSIS — N390 Urinary tract infection, site not specified: Secondary | ICD-10-CM

## 2012-01-22 LAB — URINALYSIS, ROUTINE W REFLEX MICROSCOPIC
Nitrite: POSITIVE — AB
Specific Gravity, Urine: 1.026 (ref 1.005–1.030)
Urobilinogen, UA: 1 mg/dL (ref 0.0–1.0)

## 2012-01-22 LAB — URINE MICROSCOPIC-ADD ON

## 2012-01-22 MED ORDER — METRONIDAZOLE 500 MG PO TABS: 500.0000 mg | ORAL_TABLET | Freq: Two times a day (BID) | ORAL | Status: AC

## 2012-01-22 MED ORDER — CIPROFLOXACIN HCL 500 MG PO TABS: 500.0000 mg | ORAL_TABLET | Freq: Two times a day (BID) | ORAL | Status: AC

## 2012-01-22 NOTE — ED Provider Notes (Cosign Needed)
History   This chart was scribed for Benny Lennert, MD by Sofie Rower. The patient was seen in room TR04C/TR04C and the patient's care was started at 6:19 PM     CSN: [id number]  Arrival date & time 01/22/12  1642   None     Chief Complaint  Patient presents with  . Abdominal Pain    (Consider location/radiation/quality/duration/timing/severity/associated sxs/prior treatment) Patient is a 21 y.o. female presenting with abdominal pain. The history is provided by the patient. No language interpreter was used.  Abdominal Pain The primary symptoms of the illness include abdominal pain. The primary symptoms of the illness do not include fever, nausea or vomiting. The current episode started more than 2 days ago. The onset of the illness was gradual. The problem has been gradually worsening.  The abdominal pain began more than 2 days ago. The pain came on gradually. The abdominal pain has been gradually worsening since its onset. The abdominal pain is generalized. The abdominal pain does not radiate. The abdominal pain is relieved by nothing. The abdominal pain is exacerbated by urination.  The illness is associated with recent antibiotic use. The patient states that she believes she is currently not pregnant. The patient has not had a change in bowel habit. Additional symptoms associated with the illness include urgency and frequency. Symptoms associated with the illness do not include chills.    History reviewed. No pertinent past medical history.  History reviewed. No pertinent past surgical history.  No family history on file.  History  Substance Use Topics  . Smoking status: Current Everyday Smoker  . Smokeless tobacco: Not on file  . Alcohol Use: No    OB History    Grav Para Term Preterm Abortions TAB SAB Ect Mult Living                  Review of Systems  Constitutional: Negative for fever and chills.  Gastrointestinal: Positive for abdominal pain. Negative for nausea  and vomiting.  Genitourinary: Positive for urgency and frequency.  All other systems reviewed and are negative.    Allergies  Shellfish allergy  Home Medications   Current Outpatient Rx  Name Route Sig Dispense Refill  . AZO TABS PO Oral Take 2 tablets by mouth daily.    . SULFAMETHOXAZOLE-TRIMETHOPRIM 800-160 MG PO TABS Oral Take 1 tablet by mouth 2 (two) times daily.      BP 146/92  Pulse 87  Temp 98.6 F (37 C)  Resp 16  SpO2 97%  LMP 12/24/2011  Physical Exam  Nursing note and vitals reviewed. Constitutional: She is oriented to person, place, and time. She appears well-developed.  HENT:  Head: Normocephalic.  Eyes: Conjunctivae are normal.  Neck: No tracheal deviation present.  Cardiovascular:  No murmur heard. Musculoskeletal: Normal range of motion. She exhibits tenderness (mild lumbar tenderness detected. ).  Neurological: She is oriented to person, place, and time.  Skin: Skin is warm.  Psychiatric: She has a normal mood and affect.    ED Course  Procedures (including critical care time)  DIAGNOSTIC STUDIES: Oxygen Saturation is 97% on room air, normal by my interpretation.    COORDINATION OF CARE:  6:21PM- EDP at bedside discusses treatment plan concerning administration of antibiotics.     Labs Reviewed  URINALYSIS, ROUTINE W REFLEX MICROSCOPIC - Abnormal; Notable for the following:    Color, Urine ORANGE (*)  BIOCHEMICALS MAY BE AFFECTED BY COLOR   APPearance CLOUDY (*)     Bilirubin  Urine SMALL (*)     Ketones, ur 15 (*)     Nitrite POSITIVE (*)     Leukocytes, UA SMALL (*)     All other components within normal limits  URINE MICROSCOPIC-ADD ON - Abnormal; Notable for the following:    Squamous Epithelial / LPF MANY (*)     Bacteria, UA MANY (*)     Crystals CA OXALATE CRYSTALS (*)     All other components within normal limits  POCT PREGNANCY, URINE   No results found.   No diagnosis found.    MDM       The chart was scribed  for me under my direct supervision.  I personally performed the history, physical, and medical decision making and all procedures in the evaluation of this patient.Benny Lennert, MD 01/22/12 [phone]

## 2012-01-22 NOTE — ED Notes (Signed)
Was seen last week and placed on antiobiotics for uti it is not working still having frequ and urgency

## 2012-01-23 LAB — URINE CULTURE

## 2013-02-28 ENCOUNTER — Emergency Department (HOSPITAL_COMMUNITY)
Admission: EM | Admit: 2013-02-28 | Discharge: 2013-02-28 | Disposition: A | Discharge status: Home / Self Care | Payer: 59 | Attending: Emergency Medicine | Admitting: Emergency Medicine

## 2013-02-28 ENCOUNTER — Encounter (HOSPITAL_COMMUNITY): Payer: Self-pay | Admitting: Emergency Medicine

## 2013-02-28 DIAGNOSIS — H61892 Other specified disorders of left external ear: Secondary | ICD-10-CM

## 2013-02-28 DIAGNOSIS — F17200 Nicotine dependence, unspecified, uncomplicated: Secondary | ICD-10-CM | POA: Insufficient documentation

## 2013-02-28 DIAGNOSIS — H938X9 Other specified disorders of ear, unspecified ear: Secondary | ICD-10-CM | POA: Insufficient documentation

## 2013-02-28 MED ORDER — IBUPROFEN 800 MG PO TABS: 800.0000 mg | ORAL_TABLET | Freq: Three times a day (TID) | ORAL | Status: DC

## 2013-02-28 MED ORDER — IBUPROFEN 800 MG PO TABS
800.0000 mg | ORAL_TABLET | Freq: Once | ORAL | Status: AC
  Administered 2013-02-28: 800 mg via ORAL
  Filled 2013-02-28: qty 1

## 2013-02-28 NOTE — ED Notes (Signed)
Pt. reports "knot" below left ear onset yesterday with drainage at both earlobe piercing .

## 2013-02-28 NOTE — ED Provider Notes (Signed)
Medical screening examination/treatment/procedure(s) were performed by non-physician practitioner and as supervising physician I was immediately available for consultation/collaboration.   Darlys Gales, MD 02/28/13 2239

## 2013-02-28 NOTE — ED Provider Notes (Signed)
CSN: [id number]     Arrival date & time 02/28/13  4098 History   First MD Initiated Contact with Patient 02/28/13 [phone]     Chief Complaint  Patient presents with  . Otalgia   (Consider location/radiation/quality/duration/timing/severity/associated sxs/prior Treatment) HPI Comments: Patient is a 22 year old female presented to the emergency department for a painful "knot" below left ear that began bothering her yesterday while at work. Patient states the pain is constat and sharp. Rates it 10/10. Patient states she tried one dose of Motrin last evening with some relief. Patient states her pain is worsened with palpation. Patient has also noted some purulent drainage from earlobe piercing site after trying new earrings. Denies otalgia, fevers, chills, sore throat, headache, rhinorrhea, nasal congestion.   Patient is a 22 y.o. female presenting with ear pain.  Otalgia Associated symptoms: no congestion, no ear discharge, no fever, no headaches, no hearing loss, no neck pain, no rhinorrhea, no sore throat and no tinnitus     History reviewed. No pertinent past medical history. History reviewed. No pertinent past surgical history. No family history on file. History  Substance Use Topics  . Smoking status: Current Every Day Smoker  . Smokeless tobacco: Not on file  . Alcohol Use: No   OB History   Grav Para Term Preterm Abortions TAB SAB Ect Mult Living                 Review of Systems  Constitutional: Negative for fever and chills.  HENT: Positive for ear pain. Negative for hearing loss, nosebleeds, congestion, sore throat, facial swelling, rhinorrhea, sneezing, drooling, mouth sores, trouble swallowing, neck pain, neck stiffness, dental problem, voice change, postnasal drip, sinus pressure, tinnitus and ear discharge.   Respiratory: Negative for shortness of breath.   Cardiovascular: Negative for chest pain.  Neurological: Negative for headaches.    Allergies  Shellfish  allergy  Home Medications   Current Outpatient Rx  Name  Route  Sig  Dispense  Refill  . ibuprofen (ADVIL,MOTRIN) 800 MG tablet   Oral   Take 1 tablet (800 mg total) by mouth 3 (three) times daily.   21 tablet   0    BP 124/70  Pulse 64  Temp(Src) 97.9 F (36.6 C) (Oral)  Resp 16  SpO2 100% Physical Exam  Constitutional: She is oriented to person, place, and time. She appears well-developed and well-nourished.  Patient tearful.   HENT:  Head: Normocephalic and atraumatic.  Right Ear: Tympanic membrane and external ear normal. No drainage, swelling or tenderness. No mastoid tenderness. Tympanic membrane is not injected and not erythematous.  Left Ear: Tympanic membrane and external ear normal. No drainage, swelling or tenderness. No mastoid tenderness. Tympanic membrane is not injected and not erythematous.  Nose: Nose normal.  Mouth/Throat: Oropharynx is clear and moist. No oropharyngeal exudate.  Eyes: Conjunctivae are normal.  Neck: Normal range of motion. Neck supple. No spinous process tenderness present. No rigidity.    Small tender superficial nodule on posterior cervical lymph node chain. No erythema or warmth. No drainage. No fluctuance.   Cardiovascular: Normal rate.   Pulmonary/Chest: Effort normal and breath sounds normal. No respiratory distress.  Abdominal: Soft. There is no tenderness.  Musculoskeletal: Normal range of motion.  Lymphadenopathy:    She has no cervical adenopathy.  Neurological: She is alert and oriented to person, place, and time.  Skin: Skin is warm and dry. She is not diaphoretic.  Psychiatric: She has a normal mood and affect.  ED Course  Procedures (including critical care time) Labs Review Labs Reviewed - No data to display Imaging Review No results found.  MDM   1. Nodule of left external ear    Afebrile, NAD, non-toxic appearing, AAOx4. Small tender, non-erythematous, non-warm superficial nodule noted along left posterior  cervical lymph node chain. HEENT exam otherwise unremarkable. Discussed symptomatic care with patient and return precautions. Patient is agreeable to plan. Patient d/w with Dr. Redgie Grayer, agrees with plan. Patient is stable at time of discharge        Jeannetta Ellis, PA-C 02/28/13 1607

## 2013-07-24 ENCOUNTER — Encounter (HOSPITAL_COMMUNITY): Payer: Self-pay | Admitting: Emergency Medicine

## 2013-07-24 ENCOUNTER — Emergency Department (HOSPITAL_COMMUNITY)
Admission: EM | Admit: 2013-07-24 | Discharge: 2013-07-24 | Disposition: A | Discharge status: Home / Self Care | Payer: 59 | Attending: Emergency Medicine | Admitting: Emergency Medicine

## 2013-07-24 DIAGNOSIS — T61781A Other shellfish poisoning, accidental (unintentional), initial encounter: Secondary | ICD-10-CM | POA: Insufficient documentation

## 2013-07-24 DIAGNOSIS — Y990 Civilian activity done for income or pay: Secondary | ICD-10-CM | POA: Insufficient documentation

## 2013-07-24 DIAGNOSIS — Y9389 Activity, other specified: Secondary | ICD-10-CM | POA: Insufficient documentation

## 2013-07-24 DIAGNOSIS — K5289 Other specified noninfective gastroenteritis and colitis: Secondary | ICD-10-CM | POA: Insufficient documentation

## 2013-07-24 DIAGNOSIS — R1110 Vomiting, unspecified: Secondary | ICD-10-CM

## 2013-07-24 DIAGNOSIS — Y9289 Other specified places as the place of occurrence of the external cause: Secondary | ICD-10-CM | POA: Insufficient documentation

## 2013-07-24 DIAGNOSIS — F17200 Nicotine dependence, unspecified, uncomplicated: Secondary | ICD-10-CM | POA: Insufficient documentation

## 2013-07-24 LAB — COMPREHENSIVE METABOLIC PANEL
ALBUMIN: 3.8 g/dL (ref 3.5–5.2)
ALT: 19 U/L (ref 0–35)
AST: 18 U/L (ref 0–37)
Alkaline Phosphatase: 62 U/L (ref 39–117)
BUN: 8 mg/dL (ref 6–23)
CO2: 24 mEq/L (ref 19–32)
CREATININE: 0.74 mg/dL (ref 0.50–1.10)
Calcium: 9.1 mg/dL (ref 8.4–10.5)
Chloride: 104 mEq/L (ref 96–112)
Glucose, Bld: 106 mg/dL — ABNORMAL HIGH (ref 70–99)
Potassium: 4.3 mEq/L (ref 3.7–5.3)
Sodium: 141 mEq/L (ref 137–147)
Total Bilirubin: 0.2 mg/dL — ABNORMAL LOW (ref 0.3–1.2)
Total Protein: 7.8 g/dL (ref 6.0–8.3)

## 2013-07-24 LAB — CBC WITH DIFFERENTIAL/PLATELET
BASOS PCT: 0 % (ref 0–1)
Basophils Absolute: 0 10*3/uL (ref 0.0–0.1)
Eosinophils Absolute: 0.1 10*3/uL (ref 0.0–0.7)
Eosinophils Relative: 1 % (ref 0–5)
HEMATOCRIT: 41.5 % (ref 36.0–46.0)
Hemoglobin: 14.6 g/dL (ref 12.0–15.0)
Lymphocytes Relative: 20 % (ref 12–46)
Lymphs Abs: 2 10*3/uL (ref 0.7–4.0)
MCH: 30.9 pg (ref 26.0–34.0)
MCHC: 35.2 g/dL (ref 30.0–36.0)
MCV: 87.9 fL (ref 78.0–100.0)
MONO ABS: 0.6 10*3/uL (ref 0.1–1.0)
MONOS PCT: 6 % (ref 3–12)
NEUTROS ABS: 7.3 10*3/uL (ref 1.7–7.7)
Neutrophils Relative %: 74 % (ref 43–77)
Platelets: 305 10*3/uL (ref 150–400)
RBC: 4.72 MIL/uL (ref 3.87–5.11)
RDW: 12.9 % (ref 11.5–15.5)
WBC: 9.9 10*3/uL (ref 4.0–10.5)

## 2013-07-24 MED ORDER — SODIUM CHLORIDE 0.9 % IV BOLUS (SEPSIS)
1000.0000 mL | Freq: Once | INTRAVENOUS | Status: AC
  Administered 2013-07-24: 1000 mL via INTRAVENOUS

## 2013-07-24 MED ORDER — ONDANSETRON 4 MG PO TBDP: ORAL_TABLET | ORAL | Status: DC

## 2013-07-24 MED ORDER — KETOROLAC TROMETHAMINE 30 MG/ML IJ SOLN
30.0000 mg | Freq: Once | INTRAMUSCULAR | Status: AC
  Administered 2013-07-24: 30 mg via INTRAVENOUS
  Filled 2013-07-24: qty 1

## 2013-07-24 MED ORDER — ONDANSETRON HCL 4 MG/2ML IJ SOLN
4.0000 mg | Freq: Once | INTRAMUSCULAR | Status: AC
  Administered 2013-07-24: 4 mg via INTRAVENOUS
  Filled 2013-07-24: qty 2

## 2013-07-24 MED ORDER — IBUPROFEN 800 MG PO TABS: 800.0000 mg | ORAL_TABLET | Freq: Three times a day (TID) | ORAL | Status: DC | PRN

## 2013-07-24 NOTE — ED Notes (Signed)
Dr. Estell Harpinzammit at the bedside.

## 2013-07-24 NOTE — ED Provider Notes (Signed)
CSN: [id number]     Arrival date & time 07/24/13  1013 History   First MD Initiated Contact with Patient 07/24/13 1115     Chief Complaint  Patient presents with  . Allergic Reaction   (Consider location/radiation/quality/duration/timing/severity/associated sxs/prior Treatment) Patient is a 23 y.o. female presenting with vomiting. The history is provided by the patient (the complains of vomiting.  she ate some shell fish and vomited and had abd cramps).  Emesis Severity:  Moderate Timing:  Constant Quality:  Unable to specify Able to tolerate:  Liquids Progression:  Unchanged Chronicity:  Recurrent Recent urination:  Normal Context: not self-induced   Relieved by:  Nothing Associated symptoms: no abdominal pain, no diarrhea and no headaches     History reviewed. No pertinent past medical history. History reviewed. No pertinent past surgical history. History reviewed. No pertinent family history. History  Substance Use Topics  . Smoking status: Current Every Day Smoker  . Smokeless tobacco: Not on file  . Alcohol Use: No   OB History   Grav Para Term Preterm Abortions TAB SAB Ect Mult Living                 Review of Systems  Constitutional: Negative for appetite change and fatigue.  HENT: Negative for congestion, ear discharge and sinus pressure.   Eyes: Negative for discharge.  Respiratory: Negative for cough.   Cardiovascular: Negative for chest pain.  Gastrointestinal: Positive for vomiting. Negative for abdominal pain and diarrhea.  Genitourinary: Negative for frequency and hematuria.  Musculoskeletal: Negative for back pain.  Skin: Negative for rash.  Neurological: Negative for seizures and headaches.  Psychiatric/Behavioral: Negative for hallucinations.    Allergies  Shellfish allergy  Home Medications   Current Outpatient Rx  Name  Route  Sig  Dispense  Refill  . diphenhydrAMINE (BENADRYL) 25 MG tablet   Oral   Take 25 mg by mouth every 6 (six) hours  as needed for allergies.         Marland Kitchen. ibuprofen (ADVIL,MOTRIN) 800 MG tablet   Oral   Take 800 mg by mouth every 8 (eight) hours as needed for moderate pain.         Marland Kitchen. loperamide (IMODIUM) 2 MG capsule   Oral   Take 2 mg by mouth daily as needed for diarrhea or loose stools.          BP 125/67  Pulse 68  Temp(Src) 97.6 F (36.4 C) (Oral)  Resp 20  Ht 5\' 4"  (1.626 m)  Wt 207 lb (93.895 kg)  BMI 35.51 kg/m2  SpO2 98% Physical Exam  Constitutional: She is oriented to person, place, and time. She appears well-developed.  HENT:  Head: Normocephalic.  Eyes: Conjunctivae and EOM are normal. No scleral icterus.  Neck: Neck supple. No thyromegaly present.  Cardiovascular: Normal rate and regular rhythm.  Exam reveals no gallop and no friction rub.   No murmur heard. Pulmonary/Chest: No stridor. She has no wheezes. She has no rales. She exhibits no tenderness.  Abdominal: She exhibits no distension. There is tenderness. There is no rebound.  Tenderness throughout abd  Musculoskeletal: Normal range of motion. She exhibits no edema.  Lymphadenopathy:    She has no cervical adenopathy.  Neurological: She is oriented to person, place, and time. She exhibits normal muscle tone. Coordination normal.  Skin: No rash noted. No erythema.  Psychiatric: She has a normal mood and affect. Her behavior is normal.    ED Course  Procedures (including critical care  time) Labs Review Labs Reviewed  CBC WITH DIFFERENTIAL  COMPREHENSIVE METABOLIC PANEL   Imaging Review No results found.  EKG Interpretation   None       MDM  Gastroenteritis,  Possibly from shellfish    Benny Lennert, MD 07/24/13 1323

## 2013-07-24 NOTE — ED Notes (Signed)
Pt was working at [employer] and came in contact with some shellfish, she did not eat the food , she is c/o abd pain n/v /d which is her normal reaction to shelfish

## 2013-07-24 NOTE — Discharge Instructions (Signed)
Follow up if not improving

## 2013-10-02 ENCOUNTER — Encounter (HOSPITAL_COMMUNITY): Payer: Self-pay | Admitting: Emergency Medicine

## 2013-10-02 ENCOUNTER — Emergency Department (HOSPITAL_COMMUNITY)
Admission: EM | Admit: 2013-10-02 | Discharge: 2013-10-02 | Disposition: A | Discharge status: Home / Self Care | Payer: 59 | Attending: Emergency Medicine | Admitting: Emergency Medicine

## 2013-10-02 DIAGNOSIS — R630 Anorexia: Secondary | ICD-10-CM | POA: Insufficient documentation

## 2013-10-02 DIAGNOSIS — R300 Dysuria: Secondary | ICD-10-CM | POA: Insufficient documentation

## 2013-10-02 DIAGNOSIS — J029 Acute pharyngitis, unspecified: Secondary | ICD-10-CM | POA: Insufficient documentation

## 2013-10-02 DIAGNOSIS — R112 Nausea with vomiting, unspecified: Secondary | ICD-10-CM

## 2013-10-02 DIAGNOSIS — K921 Melena: Secondary | ICD-10-CM | POA: Insufficient documentation

## 2013-10-02 DIAGNOSIS — R5381 Other malaise: Secondary | ICD-10-CM | POA: Insufficient documentation

## 2013-10-02 DIAGNOSIS — Z3202 Encounter for pregnancy test, result negative: Secondary | ICD-10-CM | POA: Insufficient documentation

## 2013-10-02 DIAGNOSIS — Z8679 Personal history of other diseases of the circulatory system: Secondary | ICD-10-CM | POA: Insufficient documentation

## 2013-10-02 DIAGNOSIS — Z792 Long term (current) use of antibiotics: Secondary | ICD-10-CM | POA: Insufficient documentation

## 2013-10-02 DIAGNOSIS — R5383 Other fatigue: Secondary | ICD-10-CM

## 2013-10-02 DIAGNOSIS — Z87440 Personal history of urinary (tract) infections: Secondary | ICD-10-CM | POA: Insufficient documentation

## 2013-10-02 DIAGNOSIS — R197 Diarrhea, unspecified: Secondary | ICD-10-CM | POA: Insufficient documentation

## 2013-10-02 DIAGNOSIS — F17200 Nicotine dependence, unspecified, uncomplicated: Secondary | ICD-10-CM | POA: Insufficient documentation

## 2013-10-02 HISTORY — DX: Urinary tract infection, site not specified: N39.0

## 2013-10-02 HISTORY — DX: Unspecified hemorrhoids: K64.9

## 2013-10-02 LAB — URINALYSIS, ROUTINE W REFLEX MICROSCOPIC
BILIRUBIN URINE: NEGATIVE
Glucose, UA: NEGATIVE mg/dL
KETONES UR: NEGATIVE mg/dL
NITRITE: NEGATIVE
Protein, ur: NEGATIVE mg/dL
SPECIFIC GRAVITY, URINE: 1.026 (ref 1.005–1.030)
Urobilinogen, UA: 0.2 mg/dL (ref 0.0–1.0)
pH: 6 (ref 5.0–8.0)

## 2013-10-02 LAB — CBC WITH DIFFERENTIAL/PLATELET
Basophils Absolute: 0 K/uL (ref 0.0–0.1)
Basophils Relative: 0 % (ref 0–1)
Eosinophils Absolute: 0.1 10*3/uL (ref 0.0–0.7)
Eosinophils Relative: 1 % (ref 0–5)
HCT: 41.2 % (ref 36.0–46.0)
Hemoglobin: 14.4 g/dL (ref 12.0–15.0)
Lymphocytes Relative: 25 % (ref 12–46)
Lymphs Abs: 2.6 10*3/uL (ref 0.7–4.0)
MCH: 30.9 pg (ref 26.0–34.0)
MCHC: 35 g/dL (ref 30.0–36.0)
MCV: 88.4 fL (ref 78.0–100.0)
Monocytes Absolute: 0.7 K/uL (ref 0.1–1.0)
Monocytes Relative: 6 % (ref 3–12)
Neutro Abs: 7.1 10*3/uL (ref 1.7–7.7)
Neutrophils Relative %: 68 % (ref 43–77)
Platelets: 309 10*3/uL (ref 150–400)
RBC: 4.66 MIL/uL (ref 3.87–5.11)
RDW: 13.2 % (ref 11.5–15.5)
WBC: 10.5 10*3/uL (ref 4.0–10.5)

## 2013-10-02 LAB — COMPREHENSIVE METABOLIC PANEL WITH GFR
ALT: 18 U/L (ref 0–35)
AST: 24 U/L (ref 0–37)
Albumin: 3.6 g/dL (ref 3.5–5.2)
Alkaline Phosphatase: 62 U/L (ref 39–117)
Potassium: 3.9 meq/L (ref 3.7–5.3)
Total Protein: 7.6 g/dL (ref 6.0–8.3)

## 2013-10-02 LAB — COMPREHENSIVE METABOLIC PANEL
BUN: 16 mg/dL (ref 6–23)
CO2: 23 mEq/L (ref 19–32)
Calcium: 9.1 mg/dL (ref 8.4–10.5)
Chloride: 102 mEq/L (ref 96–112)
Creatinine, Ser: 0.73 mg/dL (ref 0.50–1.10)
GFR calc Af Amer: >90 mL/min (ref >90)
GFR calc non Af Amer: >90 mL/min (ref >90)
Glucose, Bld: 98 mg/dL (ref 70–99)
Sodium: 140 mEq/L (ref 137–147)
Total Bilirubin: <0.2 mg/dL — ABNORMAL LOW (ref 0.3–1.2)

## 2013-10-02 LAB — WET PREP, GENITAL
Clue Cells Wet Prep HPF POC: NONE SEEN
Trich, Wet Prep: NONE SEEN
Yeast Wet Prep HPF POC: NONE SEEN

## 2013-10-02 LAB — URINE MICROSCOPIC-ADD ON

## 2013-10-02 LAB — POC URINE PREG, ED: Preg Test, Ur: NEGATIVE

## 2013-10-02 MED ORDER — ONDANSETRON HCL 4 MG/2ML IJ SOLN
4.0000 mg | Freq: Once | INTRAMUSCULAR | Status: AC
  Administered 2013-10-02: 4 mg via INTRAVENOUS
  Filled 2013-10-02: qty 2

## 2013-10-02 MED ORDER — ONDANSETRON HCL 4 MG PO TABS: 4.0000 mg | ORAL_TABLET | Freq: Four times a day (QID) | ORAL | Status: DC

## 2013-10-02 MED ORDER — PANTOPRAZOLE SODIUM 20 MG PO TBEC: 20.0000 mg | DELAYED_RELEASE_TABLET | Freq: Every day | ORAL | Status: DC

## 2013-10-02 MED ORDER — FENTANYL CITRATE 0.05 MG/ML IJ SOLN
25.0000 ug | Freq: Once | INTRAMUSCULAR | Status: AC
Start: 2013-10-02 — End: 2013-10-02
  Administered 2013-10-02: 25 ug via INTRAVENOUS
  Filled 2013-10-02: qty 2

## 2013-10-02 MED ORDER — FAMOTIDINE IN NACL 20-0.9 MG/50ML-% IV SOLN
20.0000 mg | Freq: Once | INTRAVENOUS | Status: AC
  Administered 2013-10-02: 20 mg via INTRAVENOUS
  Filled 2013-10-02: qty 50

## 2013-10-02 MED ORDER — SODIUM CHLORIDE 0.9 % IV BOLUS (SEPSIS)
1000.0000 mL | Freq: Once | INTRAVENOUS | Status: AC
  Administered 2013-10-02: 1000 mL via INTRAVENOUS

## 2013-10-02 MED ORDER — HYDROCODONE-ACETAMINOPHEN 5-325 MG PO TABS: 1.0000 | ORAL_TABLET | ORAL | Status: DC | PRN

## 2013-10-02 NOTE — ED Provider Notes (Signed)
Medical screening examination/treatment/procedure(s) were performed by non-physician practitioner and as supervising physician I was immediately available for consultation/collaboration.   EKG Interpretation None        Shelda JakesScott W. Arty Lantzy, MD 10/02/13 1314

## 2013-10-02 NOTE — Discharge Instructions (Signed)
Viral Gastroenteritis Viral gastroenteritis is also known as stomach flu. This condition affects the stomach and intestinal tract. It can cause sudden diarrhea and vomiting. The illness typically lasts 3 to 8 days. Most people develop an immune response that eventually gets rid of the virus. While this natural response develops, the virus can make you quite ill. CAUSES  Many different viruses can cause gastroenteritis, such as rotavirus or noroviruses. You can catch one of these viruses by consuming contaminated food or water. You may also catch a virus by sharing utensils or other personal items with an infected person or by touching a contaminated surface. SYMPTOMS  The most common symptoms are diarrhea and vomiting. These problems can cause a severe loss of body fluids (dehydration) and a body salt (electrolyte) imbalance. Other symptoms may include:  Fever.  Headache.  Fatigue.  Abdominal pain. DIAGNOSIS  Your caregiver can usually diagnose viral gastroenteritis based on your symptoms and a physical exam. A stool sample may also be taken to test for the presence of viruses or other infections. TREATMENT  This illness typically goes away on its own. Treatments are aimed at rehydration. The most serious cases of viral gastroenteritis involve vomiting so severely that you are not able to keep fluids down. In these cases, fluids must be given through an intravenous line (IV). HOME CARE INSTRUCTIONS   Drink enough fluids to keep your urine clear or pale yellow. Drink small amounts of fluids frequently and increase the amounts as tolerated.  Ask your caregiver for specific rehydration instructions.  Avoid:  Foods high in sugar.  Alcohol.  Carbonated drinks.  Tobacco.  Juice.  Caffeine drinks.  Extremely hot or cold fluids.  Fatty, greasy foods.  Too much intake of anything at one time.  Dairy products until 24 to 48 hours after diarrhea stops.  You may consume probiotics.  Probiotics are active cultures of beneficial bacteria. They may lessen the amount and number of diarrheal stools in adults. Probiotics can be found in yogurt with active cultures and in supplements.  Wash your hands well to avoid spreading the virus.  Only take over-the-counter or prescription medicines for pain, discomfort, or fever as directed by your caregiver. Do not give aspirin to children. Antidiarrheal medicines are not recommended.  Ask your caregiver if you should continue to take your regular prescribed and over-the-counter medicines.  Keep all follow-up appointments as directed by your caregiver. SEEK IMMEDIATE MEDICAL CARE IF:   You are unable to keep fluids down.  You do not urinate at least once every 6 to 8 hours.  You develop shortness of breath.  You notice blood in your stool or vomit. This may look like coffee grounds.  You have abdominal pain that increases or is concentrated in one small area (localized).  You have persistent vomiting or diarrhea.  You have a fever.  The patient is a child younger than 3 months, and he or she has a fever.  The patient is a child older than 3 months, and he or she has a fever and persistent symptoms.  The patient is a child older than 3 months, and he or she has a fever and symptoms suddenly get worse.  The patient is a baby, and he or she has no tears when crying. MAKE SURE YOU:   Understand these instructions.  Will watch your condition.  Will get help right away if you are not doing well or get worse. Document Released: 06/12/2005 Document Revised: 09/04/2011 Document Reviewed: 03/29/2011   [employer] Patient Information 2014 [employer]  

## 2013-10-02 NOTE — ED Notes (Signed)
Pt states started antibiotic for recent strep throat. Stop taking it because she feels like she is developing a UTI. C/o abdominal pain, lower bilateral back pain, with N/V x 6. Burning sensation when she voids.

## 2013-10-02 NOTE — ED Provider Notes (Signed)
CSN: [id number]     Arrival date & time 10/02/13  0615 History   First MD Initiated Contact with Patient 10/02/13 1025     Chief Complaint  Patient presents with  . Abdominal Pain     (Consider location/radiation/quality/duration/timing/severity/associated sxs/prior Treatment) HPI Comments: Pt with sore throat and seen at [facility] about 3-4 days ago, clinically diagnosed with strep pharyngitis and given amoxicillin and decadron.  She reports began having dysuria, difficulty urinating, burning sensation and now some right flank pain for the past 3 days, worsening.  She also has been vomiting for 1 day about 10 times, and began with diarrhea in the past 4-5 hours about 4 times with some blood in it.  No rectal pain.  She has had hemorrhoids in the past.  No vaginal bleeding or discharge except she just finished menses recently.  She is sexually active, but not recently.  No h/o yeast vaginitis, BV, STD.  Feels weak.  Sore throat is resolved  Patient is a 23 y.o. female presenting with abdominal pain. The history is provided by the patient.  Abdominal Pain Pain location:  Generalized Pain radiates to:  R flank Duration:  3 days Timing:  Constant Chronicity:  New Context: recent illness   Context: not alcohol use, not previous surgeries, not recent travel, not sick contacts and not suspicious food intake   Context comment:  Recent antibiotic usage Worsened by:  Nothing tried Ineffective treatments:  None tried Associated symptoms: anorexia, chills, diarrhea, dysuria, fatigue, nausea, sore throat and vomiting   Associated symptoms: no constipation, no vaginal bleeding and no vaginal discharge   Risk factors: obesity     Past Medical History  Diagnosis Date  . Hemorrhoids   . UTI (lower urinary tract infection)    History reviewed. No pertinent past surgical history. History reviewed. No pertinent family history. History  Substance Use Topics  . Smoking status: Current Every Day  Smoker  . Smokeless tobacco: Never Used  . Alcohol Use: No   OB History   Grav Para Term Preterm Abortions TAB SAB Ect Mult Living                 Review of Systems  Constitutional: Positive for chills, appetite change and fatigue.  HENT: Positive for sore throat. Negative for congestion.   Eyes: Negative for redness.  Gastrointestinal: Positive for nausea, vomiting, abdominal pain, diarrhea, blood in stool and anorexia. Negative for constipation and rectal pain.  Genitourinary: Positive for dysuria, flank pain and difficulty urinating. Negative for vaginal bleeding and vaginal discharge.  All other systems reviewed and are negative.     Allergies  Shellfish allergy  Home Medications   Current Outpatient Rx  Name  Route  Sig  Dispense  Refill  . amoxicillin (AMOXIL) 500 MG capsule   Oral   Take 500 mg by mouth 3 (three) times daily.         . Ciprofloxacin (CIPRO PO)   Oral   Take 1 tablet by mouth daily.           BP 126/69  Pulse 63  Temp(Src) 97.8 F (36.6 C) (Oral)  Resp 16  SpO2 98%  LMP 10/02/2013 Physical Exam  Nursing note and vitals reviewed. Constitutional: She is oriented to person, place, and time. She appears well-developed and well-nourished.  HENT:  Head: Normocephalic and atraumatic.  Mouth/Throat: Uvula is midline, oropharynx is clear and moist and mucous membranes are normal.  Eyes: EOM are normal. No scleral icterus.  Neck: Normal range of motion. Neck supple.  Cardiovascular: Normal rate, regular rhythm and intact distal pulses.   Pulmonary/Chest: Effort normal.  Abdominal: Soft. She exhibits no distension. There is tenderness. There is no rebound and no guarding.  Minimal right flank tenderness, no rash  Musculoskeletal: She exhibits no edema.  Neurological: She is alert and oriented to person, place, and time. Coordination normal.  Skin: Skin is warm and dry.    ED Course  Procedures (including critical care time) Labs  Review Labs Reviewed  COMPREHENSIVE METABOLIC PANEL - Abnormal; Notable for the following:    Total Bilirubin <0.2 (*)    All other components within normal limits  URINALYSIS, ROUTINE W REFLEX MICROSCOPIC - Abnormal; Notable for the following:    Color, Urine AMBER (*)    APPearance CLOUDY (*)    Hgb urine dipstick LARGE (*)    Leukocytes, UA SMALL (*)    All other components within normal limits  URINE MICROSCOPIC-ADD ON - Abnormal; Notable for the following:    Squamous Epithelial / LPF MANY (*)    Bacteria, UA FEW (*)    All other components within normal limits  WET PREP, GENITAL  GC/CHLAMYDIA PROBE AMP  CBC WITH DIFFERENTIAL  GI PATHOGEN PANEL BY PCR, STOOL  POC URINE PREG, ED   Imaging Review No results found.   EKG Interpretation None     RA sat is 100% and I interpret to be normal  11:41 AM Pt reports menses is not completely done and thus hematuria on UA may be related to menses, no overt signs for UTI, no nitrites.  IV being started.  Pt told that pelvic exam will need to be done and to send stool studies when able to.  Discussed with and signed out to Dr. Fredderick Severance and [facility]   MDM   Final diagnoses:  Nausea vomiting and diarrhea    Pt with recent abx use, abd is soft, no tenderness.  Some blood in stool, will send stool for culture and to check for C diff.  Otherwise with dysuria and right flank pain, possibly pyelo or stone.  Abd is soft however.      Gavin Pound. Mackinzie Vuncannon, MD 10/02/13 1141

## 2013-10-02 NOTE — ED Provider Notes (Signed)
Pt with initial onset of sore throat, was treated with amox and presenting today with n/v/d.  Stool cultures ordered.  Pelvic exam performed by Dr. Oletta LamasGhim.  Pt has finished her menstruation recently, which may explain blood in her UA.  Dr. Oletta LamasGhim has low suspicion for kidney stone. Will continue with sxs treatment.  Likely viral GI.    11:56 AM Pelvic examination was performed by me with chaperone present. Patient has no inguinal lymphadenopathy, normal labia minora and labia majora without any rash. Patient has a normal vaginal vault with blood noted oozing from cervical os. Cervical os is closed, mild discharge noted. On bimanual exam, patient has no left adnexal tenderness but does have right adnexa tenderness but without mass. No cervical motion tenderness.  1:00 PM Patient reports she is feeling better after receiving IV fluid treatment. She is afebrile with stable normal vital sign. Her pregnancy test is negative, wet prep shows no significant signs of infection, UA without evidence of UTI, she has a normal electrolyte panel and her H&H are normal as well.  Patient states that she hasn't been urinating as usual, urinating less. I suspect this is due to dehydration from persistent nausea vomiting diarrhea. Patient received IV fluid. On reexamination patient has a benign abdomen. Low suspicion for appendicitis, tubo-ovarian abscess, ovarian torsion, colitis, or any other acute emergent medical condition. Patient felt comfortable to be discharged. Return precautions discussed.  BP 123/74  Pulse 64  Temp(Src) 97.8 F (36.6 C) (Oral)  Resp 16  SpO2 100%  LMP 10/02/2013  I have reviewed nursing notes and vital signs. I personally reviewed the imaging tests through PACS system  I reviewed available ER/hospitalization records thought the EMR  Results for orders placed during the hospital encounter of 10/02/13  WET PREP, GENITAL      Result Value Ref Range   Yeast Wet Prep HPF POC NONE SEEN  NONE  SEEN   Trich, Wet Prep NONE SEEN  NONE SEEN   Clue Cells Wet Prep HPF POC NONE SEEN  NONE SEEN   WBC, Wet Prep HPF POC FEW (*) NONE SEEN  COMPREHENSIVE METABOLIC PANEL      Result Value Ref Range   Sodium 140  137 - 147 mEq/L   Potassium 3.9  3.7 - 5.3 mEq/L   Chloride 102  96 - 112 mEq/L   CO2 23  19 - 32 mEq/L   Glucose, Bld 98  70 - 99 mg/dL   BUN 16  6 - 23 mg/dL   Creatinine, Ser 3.080.73  0.50 - 1.10 mg/dL   Calcium 9.1  8.4 - 65.710.5 mg/dL   Total Protein 7.6  6.0 - 8.3 g/dL   Albumin 3.6  3.5 - 5.2 g/dL   AST 24  0 - 37 U/L   ALT 18  0 - 35 U/L   Alkaline Phosphatase 62  39 - 117 U/L   Total Bilirubin <0.2 (*) 0.3 - 1.2 mg/dL   GFR calc non Af Amer >90  >90 mL/min   GFR calc Af Amer >90  >90 mL/min  CBC WITH DIFFERENTIAL      Result Value Ref Range   WBC 10.5  4.0 - 10.5 K/uL   RBC 4.66  3.87 - 5.11 MIL/uL   Hemoglobin 14.4  12.0 - 15.0 g/dL   HCT 84.641.2  96.236.0 - 95.246.0 %   MCV 88.4  78.0 - 100.0 fL   MCH 30.9  26.0 - 34.0 pg   MCHC 35.0  30.0 -  36.0 g/dL   RDW 16.1  09.6 - 04.5 %   Platelets 309  150 - 400 K/uL   Neutrophils Relative % 68  43 - 77 %   Neutro Abs 7.1  1.7 - 7.7 K/uL   Lymphocytes Relative 25  12 - 46 %   Lymphs Abs 2.6  0.7 - 4.0 K/uL   Monocytes Relative 6  3 - 12 %   Monocytes Absolute 0.7  0.1 - 1.0 K/uL   Eosinophils Relative 1  0 - 5 %   Eosinophils Absolute 0.1  0.0 - 0.7 K/uL   Basophils Relative 0  0 - 1 %   Basophils Absolute 0.0  0.0 - 0.1 K/uL  URINALYSIS, ROUTINE W REFLEX MICROSCOPIC      Result Value Ref Range   Color, Urine AMBER (*) YELLOW   APPearance CLOUDY (*) CLEAR   Specific Gravity, Urine 1.026  1.005 - 1.030   pH 6.0  5.0 - 8.0   Glucose, UA NEGATIVE  NEGATIVE mg/dL   Hgb urine dipstick LARGE (*) NEGATIVE   Bilirubin Urine NEGATIVE  NEGATIVE   Ketones, ur NEGATIVE  NEGATIVE mg/dL   Protein, ur NEGATIVE  NEGATIVE mg/dL   Urobilinogen, UA 0.2  0.0 - 1.0 mg/dL   Nitrite NEGATIVE  NEGATIVE   Leukocytes, UA SMALL (*) NEGATIVE   URINE MICROSCOPIC-ADD ON      Result Value Ref Range   Squamous Epithelial / LPF MANY (*) RARE   WBC, UA 3-6  <3 WBC/hpf   RBC / HPF TOO NUMEROUS TO COUNT  <3 RBC/hpf   Bacteria, UA FEW (*) RARE  POC URINE PREG, ED      Result Value Ref Range   Preg Test, Ur NEGATIVE  NEGATIVE   No results found.    Fayrene Helper, PA-C 10/02/13 1312

## 2013-10-03 LAB — GC/CHLAMYDIA PROBE AMP
CT Probe RNA: NEGATIVE
GC Probe RNA: NEGATIVE

## 2013-10-22 ENCOUNTER — Emergency Department (HOSPITAL_COMMUNITY)
Admission: EM | Admit: 2013-10-22 | Discharge: 2013-10-22 | Disposition: A | Discharge status: Home / Self Care | Payer: Self-pay | Attending: Emergency Medicine | Admitting: Emergency Medicine

## 2013-10-22 ENCOUNTER — Emergency Department (HOSPITAL_COMMUNITY): Payer: Self-pay

## 2013-10-22 ENCOUNTER — Encounter (HOSPITAL_COMMUNITY): Payer: Self-pay | Admitting: Emergency Medicine

## 2013-10-22 ENCOUNTER — Emergency Department (HOSPITAL_COMMUNITY): Payer: 59

## 2013-10-22 DIAGNOSIS — R112 Nausea with vomiting, unspecified: Secondary | ICD-10-CM | POA: Insufficient documentation

## 2013-10-22 DIAGNOSIS — Z8679 Personal history of other diseases of the circulatory system: Secondary | ICD-10-CM | POA: Insufficient documentation

## 2013-10-22 DIAGNOSIS — Z87440 Personal history of urinary (tract) infections: Secondary | ICD-10-CM | POA: Insufficient documentation

## 2013-10-22 DIAGNOSIS — F17200 Nicotine dependence, unspecified, uncomplicated: Secondary | ICD-10-CM | POA: Insufficient documentation

## 2013-10-22 DIAGNOSIS — K921 Melena: Secondary | ICD-10-CM | POA: Insufficient documentation

## 2013-10-22 DIAGNOSIS — F411 Generalized anxiety disorder: Secondary | ICD-10-CM | POA: Insufficient documentation

## 2013-10-22 DIAGNOSIS — Z3202 Encounter for pregnancy test, result negative: Secondary | ICD-10-CM | POA: Insufficient documentation

## 2013-10-22 DIAGNOSIS — R109 Unspecified abdominal pain: Secondary | ICD-10-CM | POA: Insufficient documentation

## 2013-10-22 LAB — COMPREHENSIVE METABOLIC PANEL
ALT: 25 U/L (ref 0–35)
AST: 22 U/L (ref 0–37)
Albumin: 3.8 g/dL (ref 3.5–5.2)
Alkaline Phosphatase: 63 U/L (ref 39–117)
BUN: 10 mg/dL (ref 6–23)
CALCIUM: 9.2 mg/dL (ref 8.4–10.5)
CO2: 23 meq/L (ref 19–32)
CREATININE: 0.67 mg/dL (ref 0.50–1.10)
Chloride: 101 mEq/L (ref 96–112)
GFR calc non Af Amer: >90 mL/min (ref >90)
Glucose, Bld: 117 mg/dL — ABNORMAL HIGH (ref 70–99)
Potassium: 4.2 mEq/L (ref 3.7–5.3)
Sodium: 139 mEq/L (ref 137–147)
TOTAL PROTEIN: 7.9 g/dL (ref 6.0–8.3)
Total Bilirubin: 0.3 mg/dL (ref 0.3–1.2)

## 2013-10-22 LAB — CBC
HCT: 42.4 % (ref 36.0–46.0)
HEMOGLOBIN: 14.6 g/dL (ref 12.0–15.0)
MCH: 30.4 pg (ref 26.0–34.0)
MCHC: 34.4 g/dL (ref 30.0–36.0)
MCV: 88.1 fL (ref 78.0–100.0)
Platelets: 313 10*3/uL (ref 150–400)
RBC: 4.81 MIL/uL (ref 3.87–5.11)
RDW: 13.1 % (ref 11.5–15.5)
WBC: 12 10*3/uL — ABNORMAL HIGH (ref 4.0–10.5)

## 2013-10-22 LAB — URINALYSIS, ROUTINE W REFLEX MICROSCOPIC
Bilirubin Urine: NEGATIVE
GLUCOSE, UA: NEGATIVE mg/dL
HGB URINE DIPSTICK: NEGATIVE
KETONES UR: NEGATIVE mg/dL
Leukocytes, UA: NEGATIVE
Nitrite: NEGATIVE
PH: 8.5 — AB (ref 5.0–8.0)
Protein, ur: NEGATIVE mg/dL
Specific Gravity, Urine: 1.018 (ref 1.005–1.030)
Urobilinogen, UA: 0.2 mg/dL (ref 0.0–1.0)

## 2013-10-22 LAB — POC URINE PREG, ED: Preg Test, Ur: NEGATIVE

## 2013-10-22 LAB — POC OCCULT BLOOD, ED: FECAL OCCULT BLD: POSITIVE — AB

## 2013-10-22 LAB — LIPASE, BLOOD: Lipase: 24 U/L (ref 11–59)

## 2013-10-22 MED ORDER — ONDANSETRON 4 MG PO TBDP: ORAL_TABLET | ORAL | Status: DC

## 2013-10-22 MED ORDER — IOHEXOL 300 MG/ML  SOLN
25.0000 mL | Freq: Once | INTRAMUSCULAR | Status: AC | PRN
  Administered 2013-10-22: 25 mL via ORAL

## 2013-10-22 MED ORDER — HYDROMORPHONE HCL PF 1 MG/ML IJ SOLN
1.0000 mg | Freq: Once | INTRAMUSCULAR | Status: AC
  Administered 2013-10-22: 1 mg via INTRAVENOUS
  Filled 2013-10-22: qty 1

## 2013-10-22 MED ORDER — ONDANSETRON HCL 4 MG/2ML IJ SOLN
4.0000 mg | Freq: Once | INTRAMUSCULAR | Status: AC
  Administered 2013-10-22: 4 mg via INTRAVENOUS
  Filled 2013-10-22: qty 2

## 2013-10-22 MED ORDER — SODIUM CHLORIDE 0.9 % IV BOLUS (SEPSIS)
1000.0000 mL | Freq: Once | INTRAVENOUS | Status: AC
  Administered 2013-10-22: 1000 mL via INTRAVENOUS

## 2013-10-22 MED ORDER — IOHEXOL 300 MG/ML  SOLN
100.0000 mL | Freq: Once | INTRAMUSCULAR | Status: AC | PRN
  Administered 2013-10-22: 100 mL via INTRAVENOUS

## 2013-10-22 MED ORDER — CIPROFLOXACIN HCL 500 MG PO TABS: 500.0000 mg | ORAL_TABLET | Freq: Two times a day (BID) | ORAL | Status: DC

## 2013-10-22 MED ORDER — METRONIDAZOLE 500 MG PO TABS: 500.0000 mg | ORAL_TABLET | Freq: Two times a day (BID) | ORAL | Status: DC

## 2013-10-22 MED ORDER — HYDROCODONE-ACETAMINOPHEN 5-325 MG PO TABS: 1.0000 | ORAL_TABLET | Freq: Four times a day (QID) | ORAL | Status: DC | PRN

## 2013-10-22 NOTE — Discharge Planning (Signed)
[facility] Felicia E, [employer]iaison  Spoke to patient about primary care resources and establishing care with a provider. Patient was given a Facilities managerresource guide and my contact information for any future questions or concerns. Patient tearful at the time.

## 2013-10-22 NOTE — ED Notes (Signed)
Pt reporting since 0400 this morning lower abdominal pain and bright red rectal bleeding. Also, 9-10 times of vomiting, no blood. Reports "tummy"  Issues but nothing diagnosed. Pt is crying in triage. VSS

## 2013-10-22 NOTE — ED Provider Notes (Signed)
Medical screening examination/treatment/procedure(s) were performed by non-physician practitioner and as supervising physician I was immediately available for consultation/collaboration.   EKG Interpretation None        Abigail Catlin B. Bernette MayersSheldon, MD 10/22/13 [phone]

## 2013-10-22 NOTE — ED Provider Notes (Signed)
CSN: [id number]     Arrival date & time 10/22/13  1052 History   First MD Initiated Contact with Patient 10/22/13 1113     Chief Complaint  Patient presents with  . Rectal Bleeding  . Abdominal Pain     (Consider location/radiation/quality/duration/timing/severity/associated sxs/prior Treatment) HPI Abigail Gray is a(n) 23 y.o. female who presents to the emergency department with cc of abd pain. States that she awoke from sleep with severe BL lower quadrant abdominal pain which is now diffuse. She will right red blood per rectum which she states she with diarrhea when she looked in the toilet was purely blood.  She has also had several episodes of non-bleeding believes, non-bloody vomitus.  She complains of severe diffuse abdominal pain, nausea.  She is no more episodes of vomiting or blood per rectum since she's been here.  Patient denies any fevers, recent foreign travel, congestion or suspicious foods, or contacts with similar symptoms.  She has no history of abdominal surgeries.  Last menstrual period was 10/02/2013.  She did not take any medications on a regular basis.  Patient denies any urinary or vaginal symptoms.  She has no family history of or personal history of Crohn's disease or ulcerative colitis.   Past Medical History  Diagnosis Date  . Hemorrhoids   . UTI (lower urinary tract infection)    History reviewed. No pertinent past surgical history. No family history on file. History  Substance Use Topics  . Smoking status: Current Every Day Smoker  . Smokeless tobacco: Never Used  . Alcohol Use: No   OB History   Grav Para Term Preterm Abortions TAB SAB Ect Mult Living                 Review of Systems  Constitutional: Negative.  Negative for fever.  Gastrointestinal: Positive for nausea, vomiting, abdominal pain and blood in stool.  Genitourinary: Negative for dysuria, vaginal bleeding, vaginal discharge and vaginal pain.  All other systems reviewed and are  negative.     Allergies  Shellfish allergy  Home Medications   Prior to Admission medications   Medication Sig Start Date End Date Taking? Authorizing Provider  Aspirin-Salicylamide-Caffeine (BC HEADACHE POWDER PO) Take 1 packet by mouth daily as needed (pain).   Yes Historical Provider, MD   BP 114/66  Pulse 61  Temp(Src) 98.1 F (36.7 C) (Oral)  Resp 20  Ht 5\' 3"  (1.6 m)  Wt 185 lb (83.915 kg)  BMI 32.78 kg/m2  SpO2 99%  LMP 10/02/2013 Physical Exam  Constitutional: She is oriented to person, place, and time. She appears well-developed and well-nourished. No distress.  Tearful.  HENT:  Head: Normocephalic and atraumatic.  Eyes: Conjunctivae are normal. No scleral icterus.  Neck: Normal range of motion.  Cardiovascular: Normal rate, regular rhythm and normal heart sounds.  Exam reveals no gallop and no friction rub.   No murmur heard. Pulmonary/Chest: Effort normal and breath sounds normal. No respiratory distress.  Abdominal: Soft. She exhibits no distension and no mass. There is tenderness. There is guarding.  Quiet abdomen, diffuse guarding and tenderness.  Neurological: She is alert and oriented to person, place, and time.  Skin: Skin is warm and dry. She is not diaphoretic.  Psychiatric:  Anxious  Digital Rectal Exam reveals sphincter with good tone. No external hemorrhoids. No masses or fissures. Stool color is Ola with no overt blood. Occult stool test is positive   ED Course  Procedures (including critical care time) Labs Review  Labs Reviewed  CBC - Abnormal; Notable for the following:    WBC 12.0 (*)    All other components within normal limits  COMPREHENSIVE METABOLIC PANEL - Abnormal; Notable for the following:    Glucose, Bld 117 (*)    All other components within normal limits  URINALYSIS, ROUTINE W REFLEX MICROSCOPIC - Abnormal; Notable for the following:    APPearance CLOUDY (*)    pH 8.5 (*)    All other components within normal limits  POC  OCCULT BLOOD, ED - Abnormal; Notable for the following:    Fecal Occult Bld POSITIVE (*)    All other components within normal limits  LIPASE, BLOOD  URINE MICROSCOPIC-ADD ON  POC URINE PREG, ED    Imaging Review Dg Abd Acute W/chest  10/22/2013   CLINICAL DATA:  Rectal bleeding, abdominal pain.  EXAM: ACUTE ABDOMEN SERIES (ABDOMEN 2 VIEW & CHEST 1 VIEW)  COMPARISON:  Chest radiograph 11/20/2008.  FINDINGS: Frontal view of the chest shows midline trachea and normal heart size. Lungs are clear. No pleural fluid.  Two views of the abdomen show scattered gas and stool in the colon. No small bowel dilatation. No unexpected radiopaque calculi.  IMPRESSION: No acute findings. No findings to explain the patient's abdominal pain.   Electronically Signed   By: Leanna BattlesMelinda  Blietz M.D.   On: 10/22/2013 12:27     EKG Interpretation None      MDM   Final diagnoses:  None      Filed Vitals:   10/22/13 1059 10/22/13 1215  BP: 146/79 114/66  Pulse: 88 61  Temp: 98.1 F (36.7 C)   TempSrc: Oral   Resp: 20   Height: 5\' 3"  (1.6 m)   Weight: 185 lb (83.915 kg)   SpO2: 99% 99%   Patient with diffuse severe abdominal pain, nausea.  She has had several visits for abdominal pain before however she states they have never been this bad.  Eyes she has had multiple CT scans of the abdomen and pelvis as well.  Vital signs appear stable, afebrile, she does have a positive fecal a cold blood test and new signs of hemorrhoids or anal fissures.  Abdominal x-ray is negative for evidence of small bowel obstruction or perforation.  A CT scan of the abdomen is pain pending.  The patient has been given pain and nausea medications with moderate relief of her symptoms.   3:04 PM Filed Vitals:   10/22/13 1059 10/22/13 1215 10/22/13 1349 10/22/13 1449  BP: 146/79 114/66 134/81 116/64  Pulse: 88 61 72 82  Temp: 98.1 F (36.7 C)     TempSrc: Oral     Resp: 20  20 28   Height: 5\' 3"  (1.6 m)     Weight: 185 lb  (83.915 kg)     SpO2: 99% 99% 100% 100%   CT scan with questionable colonic wall thickening.  No other acute abnormalities.  Patient will be discharged with Cipro and Flagyl to cover for possible colitis.  Eyes she is to follow up with gastroenterology.  Return precautions discussed.  Patient is nontoxic, nonseptic appearing, in no apparent distress.  Patient's pain and other symptoms adequately managed in emergency department.  Fluid bolus given.  Labs, imaging and vitals reviewed.  Patient does not meet the SIRS or Sepsis criteria.  On repeat exam patient does not have a surgical abdomin and there are nor peritoneal signs.  No indication of appendicitis, bowel obstruction, bowel perforation, cholecystitis, diverticulitis. Patient discharged home  with symptomatic treatment and given strict instructions for follow-up with their primary care physician.  I have also discussed reasons to return immediately to the ER.  Patient expresses understanding and agrees with plan.     Arthor CaptainAbigail Neco Kling, PA-C 10/22/13 1506

## 2013-10-22 NOTE — Discharge Instructions (Signed)
Colitis Colitis is inflammation of the colon. Colitis can be a short-term or long-standing (chronic) illness. Crohn's disease and ulcerative colitis are 2 types of colitis which are chronic. They usually require lifelong treatment. CAUSES  There are many different causes of colitis, including:  Viruses.  Germs (bacteria).  Medicine reactions. SYMPTOMS   Diarrhea.  Intestinal bleeding.  Pain.  Fever.  Throwing up (vomiting).  Tiredness (fatigue).  Weight loss.  Bowel blockage. DIAGNOSIS  The diagnosis of colitis is based on examination and stool or blood tests. X-rays, CT scan, and colonoscopy may also be needed. TREATMENT  Treatment may include:  Fluids given through the vein (intravenously).  Bowel rest (nothing to eat or drink for a period of time).  Medicine for pain and diarrhea.  Medicines (antibiotics) that kill germs.  Cortisone medicines.  Surgery. HOME CARE INSTRUCTIONS   Get plenty of rest.  Drink enough water and fluids to keep your urine clear or pale yellow.  Eat a well-balanced diet.  Call your caregiver for follow-up as recommended. SEEK IMMEDIATE MEDICAL CARE IF:   You develop chills.  You have an oral temperature above 102 F (38.9 C), not controlled by medicine.  You have extreme weakness, fainting, or dehydration.  You have repeated vomiting.  You develop severe belly (abdominal) pain or are passing bloody or tarry stools. MAKE SURE YOU:   Understand these instructions.  Will watch your condition.  Will get help right away if you are not doing well or get worse. Document Released: 07/20/2004 Document Revised: 09/04/2011 Document Reviewed: 10/15/2009 [facility] Patient Information 2014 [place], [place]. Diet The clear liquid diet consists of foods that are liquid or will become liquid at room temperature. Examples of foods allowed on a clear liquid diet include fruit juice, broth or bouillon, gelatin, or frozen ice pops. You  should be able to see through the liquid. The purpose of this diet is to provide the necessary fluids, electrolytes (such as sodium and potassium), and energy to keep the body functioning during times when you are not able to consume a regular diet. A clear liquid diet should not be continued for long periods of time, as it is not nutritionally adequate.  A CLEAR LIQUID DIET MAY BE NEEDED:  When a sudden-onset (acute) condition occurs before or after surgery.   As the first step in oral feeding.   For fluid and electrolyte replacement in diarrheal diseases.   As a diet before certain medical tests are performed.  ADEQUACY The clear liquid diet is adequate only in ascorbic acid, according to the Recommended Dietary Allowances of the [employer].  CHOOSING FOODS Breads and Starches  Allowed: None are allowed.   Avoid: All are to be avoided.  Vegetables  Allowed: Strained vegetable juices.   Avoid: Any others.  Fruit  Allowed: Strained fruit juices and fruit drinks. Include 1 serving of citrus or vitamin C-enriched fruit juice daily.   Avoid: Any others.  Meat and Meat Substitutes  Allowed: None are allowed.   Avoid: All are to be avoided.  Milk Products  Allowed: None are allowed.   Avoid: All are to be avoided.  Soups and Combination Foods  Allowed: Clear bouillon, broth, or strained broth-based soups.   Avoid: Any others.  Desserts and Sweets  Allowed: Sugar, honey. High-protein gelatin. Flavored gelatin, ices, or frozen ice pops that do not contain milk.   Avoid: Any others.  Fats and Oils  Allowed: None are allowed.   Avoid: All are to  be avoided.  Beverages  Allowed: Cereal beverages, coffee (regular or decaffeinated), tea, or soda at the discretion of your health care provider.   Avoid: Any others.  Condiments  Allowed: Salt.   Avoid: Any others, including pepper.   Supplements  Allowed: Liquid nutrition beverages that you can see through.   Avoid: Any others that contain lactose or fiber. SAMPLE MEAL PLAN Breakfast  4 oz (120 mL) strained orange juice.   to 1 cup (120 to 240 mL) gelatin (plain or fortified).  1 cup (240 mL) beverage (coffee or tea).  Sugar, if desired. Midmorning Snack   cup (120 mL) gelatin (plain or fortified). Lunch  1 cup (240 mL) broth or consomm.  4 oz (120 mL) strained grapefruit juice.   cup (120 mL) gelatin (plain or fortified).  1 cup (240 mL) beverage (coffee or tea).  Sugar, if desired. Midafternoon Snack   cup (120 mL) fruit ice.   cup (120 mL) strained fruit juice. Dinner  1 cup (240 mL) broth or consomm.   cup (120 mL) cranberry juice.   cup (120 mL) flavored gelatin (plain or fortified).  1 cup (240 mL) beverage (coffee or tea).  Sugar, if desired. Evening Snack  4 oz (120 mL) strained apple juice (vitamin C-fortified).   cup (120 mL) flavored gelatin (plain or fortified). MAKE SURE YOU:  Understand these instructions.  Will watch your child's condition.  Will get help right away if your child is not doing well or gets worse. Document Released: 06/12/2005 Document Revised: 02/12/2013 Document Reviewed: 11/12/2012 [facility] Patient Information 2014 [place], [place]. Metronidazole tablets or capsules What is this medicine? METRONIDAZOLE (me troe NI da zole) is an antiinfective. It is used to treat certain kinds of bacterial and protozoal infections. It will not work for colds, flu, or other viral infections. This medicine may be used for other purposes; ask your health care provider or pharmacist if you have questions. COMMON BRAND NAME(S): Flagyl What should I tell my health care provider before I take this medicine? They need to know if you have any of these conditions: -anemia or other blood disorders -disease of the nervous system -fungal or yeast  infection -if you drink alcohol containing drinks -liver disease -seizures -an unusual or allergic reaction to metronidazole, or other medicines, foods, dyes, or preservatives -pregnant or trying to get pregnant -breast-feeding How should I use this medicine? Take this medicine by mouth with a full glass of water. Follow the directions on the prescription label. Take your medicine at regular intervals. Do not take your medicine more often than directed. Take all of your medicine as directed even if you think you are better. Do not skip doses or stop your medicine early. Talk to your pediatrician regarding the use of this medicine in children. Special care may be needed. Overdosage: If you think you have taken too much of this medicine contact a poison control center or emergency room at once. NOTE: This medicine is only for you. Do not share this medicine with others. What if I miss a dose? If you miss a dose, take it as soon as you can. If it is almost time for your next dose, take only that dose. Do not take double or extra doses. What may interact with this medicine? Do not take this medicine with any of the following medications: -alcohol or any product that contains alcohol -amprenavir oral solution -cisapride -disulfiram -dofetilide -dronedarone -paclitaxel injection -pimozide -ritonavir oral solution -sertraline oral solution -sulfamethoxazole-trimethoprim injection -  thioridazine -ziprasidone This medicine may also interact with the following medications: -cimetidine -lithium -other medicines that prolong the QT interval (cause an abnormal heart rhythm) -phenobarbital -phenytoin -warfarin This list may not describe all possible interactions. Give your health care provider a list of all the medicines, herbs, non-prescription drugs, or dietary supplements you use. Also tell them if you smoke, drink alcohol, or use illegal drugs. Some items may interact with your  medicine. What should I watch for while using this medicine? Tell your doctor or health care professional if your symptoms do not improve or if they get worse. You may get drowsy or dizzy. Do not drive, use machinery, or do anything that needs mental alertness until you know how this medicine affects you. Do not stand or sit up quickly, especially if you are an older patient. This reduces the risk of dizzy or fainting spells. Avoid alcoholic drinks while you are taking this medicine and for three days afterward. Alcohol may make you feel dizzy, sick, or flushed. If you are being treated for a sexually transmitted disease, avoid sexual contact until you have finished your treatment. Your sexual partner may also need treatment. What side effects may I notice from receiving this medicine? Side effects that you should report to your doctor or health care professional as soon as possible: -allergic reactions like skin rash or hives, swelling of the face, lips, or tongue -confusion, clumsiness -difficulty speaking -discolored or sore mouth -dizziness -fever, infection -numbness, tingling, pain or weakness in the hands or feet -trouble passing urine or change in the amount of urine -redness, blistering, peeling or loosening of the skin, including inside the mouth -seizures -unusually weak or tired -vaginal irritation, dryness, or discharge Side effects that usually do not require medical attention (report to your doctor or health care professional if they continue or are bothersome): -diarrhea -headache -irritability -metallic taste -nausea -stomach pain or cramps -trouble sleeping This list may not describe all possible side effects. Call your doctor for medical advice about side effects. You may report side effects to FDA at 1-800-FDA-1088. Where should I keep my medicine? Keep out of the reach of children. Store at room temperature below 25 degrees C (77 degrees F). Protect from light.  Keep container tightly closed. Throw away any unused medicine after the expiration date. NOTE: This sheet is a summary. It may not cover all possible information. If you have questions about this medicine, talk to your doctor, pharmacist, or health care provider.  2014, Elsevier/Gold Standard. (2012-12-10 14:08:26)  Ciprofloxacin tablets What is this medicine? CIPROFLOXACIN (sip roe FLOX a sin) is a quinolone antibiotic. It is used to treat certain kinds of bacterial infections. It will not work for colds, flu, or other viral infections. This medicine may be used for other purposes; ask your health care provider or pharmacist if you have questions. COMMON BRAND NAME(S): Cipro What should I tell my health care provider before I take this medicine? They need to know if you have any of these conditions: -bone problems -cerebral disease -joint problems -irregular heartbeat -kidney disease -liver disease -myasthenia gravis -seizure disorder -tendon problems -an unusual or allergic reaction to ciprofloxacin, other antibiotics or medicines, foods, dyes, or preservatives -pregnant or trying to get pregnant -breast-feeding How should I use this medicine? Take this medicine by mouth with a glass of water. Follow the directions on the prescription label. Take your medicine at regular intervals. Do not take your medicine more often than directed. Take all of your medicine  as directed even if you think your are better. Do not skip doses or stop your medicine early. You can take this medicine with food or on an empty stomach. It can be taken with a meal that contains dairy or calcium, but do not take it alone with a dairy product, like milk or yogurt or calcium-fortified juice. A special MedGuide will be given to you by the pharmacist with each prescription and refill. Be sure to read this information carefully each time. Talk to your pediatrician regarding the use of this medicine in children. Special  care may be needed. Overdosage: If you think you have taken too much of this medicine contact a poison control center or emergency room at once. NOTE: This medicine is only for you. Do not share this medicine with others. What if I miss a dose? If you miss a dose, take it as soon as you can. If it is almost time for your next dose, take only that dose. Do not take double or extra doses. What may interact with this medicine? Do not take this medicine with any of the following medications: -cisapride -droperidol -terfenadine -tizanidine This medicine may also interact with the following medications: -antacids -caffeine -cyclosporin -didanosine (ddI) buffered tablets or powder -medicines for diabetes -medicines for inflammation like ibuprofen, naproxen -methotrexate -multivitamins -omeprazole -phenytoin -probenecid -sucralfate -theophylline -warfarin This list may not describe all possible interactions. Give your health care provider a list of all the medicines, herbs, non-prescription drugs, or dietary supplements you use. Also tell them if you smoke, drink alcohol, or use illegal drugs. Some items may interact with your medicine. What should I watch for while using this medicine? Tell your doctor or health care professional if your symptoms do not improve. Do not treat diarrhea with over the counter products. Contact your doctor if you have diarrhea that lasts more than 2 days or if it is severe and watery. You may get drowsy or dizzy. Do not drive, use machinery, or do anything that needs mental alertness until you know how this medicine affects you. Do not stand or sit up quickly, especially if you are an older patient. This reduces the risk of dizzy or fainting spells. This medicine can make you more sensitive to the sun. Keep out of the sun. If you cannot avoid being in the sun, wear protective clothing and use sunscreen. Do not use sun lamps or tanning beds/booths. Avoid antacids,  aluminum, calcium, iron, magnesium, and zinc products for 6 hours before and 2 hours after taking a dose of this medicine. What side effects may I notice from receiving this medicine? Side effects that you should report to your doctor or health care professional as soon as possible: - allergic reactions like skin rash, itching or hives, swelling of the face, lips, or tongue - breathing problems - confusion, nightmares or hallucinations - feeling faint or lightheaded, falls - irregular heartbeat - joint, muscle or tendon pain or swelling - pain or trouble passing urine -persistent headache with or without blurred vision - redness, blistering, peeling or loosening of the skin, including inside the mouth - seizure - unusual pain, numbness, tingling, or weakness Side effects that usually do not require medical attention (report to your doctor or health care professional if they continue or are bothersome): - diarrhea - nausea or stomach upset - white patches or sores in the mouth This list may not describe all possible side effects. Call your doctor for medical advice about side effects. You may  report side effects to FDA at 1-800-FDA-1088. Where should I keep my medicine? Keep out of the reach of children. Store at room temperature below 30 degrees C (86 degrees F). Keep container tightly closed. Throw away any unused medicine after the expiration date. NOTE: This sheet is a summary. It may not cover all possible information. If you have questions about this medicine, talk to your doctor, pharmacist, or health care provider.  2014, Elsevier/Gold Standard. (2011-06-30 12:53:06)

## 2013-10-22 NOTE — ED Notes (Signed)
Pt returned from CT °

## 2013-10-22 NOTE — ED Notes (Signed)
Pt to CT scan.

## 2013-11-05 ENCOUNTER — Emergency Department (HOSPITAL_COMMUNITY)
Admission: EM | Admit: 2013-11-05 | Discharge: 2013-11-05 | Discharge status: Against Medical Advice | Payer: BC Managed Care – PPO | Attending: Emergency Medicine | Admitting: Emergency Medicine

## 2013-11-05 ENCOUNTER — Encounter (HOSPITAL_COMMUNITY): Payer: Self-pay | Admitting: Emergency Medicine

## 2013-11-05 DIAGNOSIS — R109 Unspecified abdominal pain: Secondary | ICD-10-CM

## 2013-11-05 DIAGNOSIS — Z87440 Personal history of urinary (tract) infections: Secondary | ICD-10-CM | POA: Insufficient documentation

## 2013-11-05 DIAGNOSIS — R112 Nausea with vomiting, unspecified: Secondary | ICD-10-CM | POA: Insufficient documentation

## 2013-11-05 DIAGNOSIS — Z8679 Personal history of other diseases of the circulatory system: Secondary | ICD-10-CM | POA: Insufficient documentation

## 2013-11-05 DIAGNOSIS — R1013 Epigastric pain: Secondary | ICD-10-CM | POA: Insufficient documentation

## 2013-11-05 DIAGNOSIS — M549 Dorsalgia, unspecified: Secondary | ICD-10-CM | POA: Insufficient documentation

## 2013-11-05 DIAGNOSIS — R638 Other symptoms and signs concerning food and fluid intake: Secondary | ICD-10-CM | POA: Insufficient documentation

## 2013-11-05 DIAGNOSIS — R197 Diarrhea, unspecified: Secondary | ICD-10-CM | POA: Insufficient documentation

## 2013-11-05 DIAGNOSIS — F17200 Nicotine dependence, unspecified, uncomplicated: Secondary | ICD-10-CM | POA: Insufficient documentation

## 2013-11-05 DIAGNOSIS — R5381 Other malaise: Secondary | ICD-10-CM | POA: Insufficient documentation

## 2013-11-05 DIAGNOSIS — K921 Melena: Secondary | ICD-10-CM | POA: Insufficient documentation

## 2013-11-05 DIAGNOSIS — R5383 Other fatigue: Secondary | ICD-10-CM

## 2013-11-05 LAB — COMPREHENSIVE METABOLIC PANEL
ALT: 27 U/L (ref 0–35)
AST: 24 U/L (ref 0–37)
Albumin: 4.3 g/dL (ref 3.5–5.2)
Alkaline Phosphatase: 69 U/L (ref 39–117)
BUN: 12 mg/dL (ref 6–23)
CO2: 20 mEq/L (ref 19–32)
Calcium: 9.4 mg/dL (ref 8.4–10.5)
Chloride: 103 mEq/L (ref 96–112)
Creatinine, Ser: 0.68 mg/dL (ref 0.50–1.10)
GFR calc Af Amer: >90 mL/min (ref >90)
GFR calc non Af Amer: >90 mL/min (ref >90)
Glucose, Bld: 110 mg/dL — ABNORMAL HIGH (ref 70–99)
Potassium: 3.7 mEq/L (ref 3.7–5.3)
Sodium: 138 mEq/L (ref 137–147)
Total Bilirubin: 0.3 mg/dL (ref 0.3–1.2)
Total Protein: 8.5 g/dL — ABNORMAL HIGH (ref 6.0–8.3)

## 2013-11-05 LAB — CBC WITH DIFFERENTIAL/PLATELET
Basophils Absolute: 0 10*3/uL (ref 0.0–0.1)
Basophils Relative: 0 % (ref 0–1)
Eosinophils Absolute: 0.1 10*3/uL (ref 0.0–0.7)
Eosinophils Relative: 1 % (ref 0–5)
HCT: 44.3 % (ref 36.0–46.0)
Hemoglobin: 15.5 g/dL — ABNORMAL HIGH (ref 12.0–15.0)
Lymphocytes Relative: 17 % (ref 12–46)
Lymphs Abs: 1.6 10*3/uL (ref 0.7–4.0)
MCH: 30.8 pg (ref 26.0–34.0)
MCHC: 35 g/dL (ref 30.0–36.0)
MCV: 88.1 fL (ref 78.0–100.0)
Monocytes Absolute: 0.5 10*3/uL (ref 0.1–1.0)
Monocytes Relative: 6 % (ref 3–12)
Neutro Abs: 7.1 10*3/uL (ref 1.7–7.7)
Neutrophils Relative %: 76 % (ref 43–77)
Platelets: 325 10*3/uL (ref 150–400)
RBC: 5.03 MIL/uL (ref 3.87–5.11)
RDW: 13.4 % (ref 11.5–15.5)
WBC: 9.3 10*3/uL (ref 4.0–10.5)

## 2013-11-05 LAB — LIPASE, BLOOD: Lipase: 23 U/L (ref 11–59)

## 2013-11-05 MED ORDER — SODIUM CHLORIDE 0.9 % IV BOLUS (SEPSIS)
1000.0000 mL | Freq: Once | INTRAVENOUS | Status: AC
  Administered 2013-11-05: 1000 mL via INTRAVENOUS

## 2013-11-05 MED ORDER — SODIUM CHLORIDE 0.9 % IV BOLUS (SEPSIS): 1000.0000 mL | Freq: Once | INTRAVENOUS | Status: DC

## 2013-11-05 MED ORDER — ONDANSETRON HCL 4 MG/2ML IJ SOLN
4.0000 mg | Freq: Once | INTRAMUSCULAR | Status: AC
  Administered 2013-11-05: 4 mg via INTRAVENOUS
  Filled 2013-11-05: qty 2

## 2013-11-05 MED ORDER — HYDROMORPHONE HCL PF 1 MG/ML IJ SOLN
1.0000 mg | Freq: Once | INTRAMUSCULAR | Status: AC
  Administered 2013-11-05: 1 mg via INTRAVENOUS
  Filled 2013-11-05: qty 1

## 2013-11-05 NOTE — ED Notes (Signed)
Pt states that she has had abdominal pain with N/V/D for 2 weeks. Pt was seen for this and given medication for colitis. Pt states that symptoms have continued. Pt tearful.

## 2013-11-05 NOTE — ED Notes (Signed)
Pt reports she is ready to leave. Wants the IV out of her arm. PA made aware and reports she will come talk to patient. Pt agreeable to this plan.

## 2013-11-05 NOTE — ED Notes (Signed)
PA at bedside.

## 2013-11-05 NOTE — ED Notes (Signed)
Pt states that she wants to leave. Pt informed of risks of leaving. Pt states that she is aware and wants her IV out and to leave so that she can go to work.

## 2013-11-05 NOTE — ED Provider Notes (Signed)
CSN: [id number]     Arrival date & time 11/05/13  [id number] History   First MD Initiated Contact with Patient 11/05/13 [phone]     Chief Complaint  Patient presents with  . Abdominal Pain    HPI  Alen Bleacherabitha L Munshi is a 23 y.o. female with a PMH of hemorrhoids and UTI who presents to the ED for evaluation of abdominal pain. History was provided by the patient. Patient recently seen in the ED on 10/22/13 for a similar complaint. Patient complains of continued unchanged constant stabbing and cramping pain to the upper abdomen diffusely with radiation to her lower back. Eating makes her pain worse. She has not taken anything for pain today but has been taking Immodium with no relief. She also has had nausea, vomiting, and diarrhea. She has had several episodes of emesis throughout the week and has not been able to eat anything in two days. She also has had several episodes of diarrhea which started two days ago. Patient also reports bright red blood coating her stool. No hemorrhage or rectal pain. She denies any dysuria, vaginal bleeding/discharge, hematuria, fever, chills, chest pain, SOB, cough, headache, dizziness or lightheadedness. Patient discharged on cipro, flagyl, zofran and norco. She states she completed the antibiotics. Did not follow-up with anyone after discharge. No previous abdominal surgeries.     Past Medical History  Diagnosis Date  . Hemorrhoids   . UTI (lower urinary tract infection)    History reviewed. No pertinent past surgical history. History reviewed. No pertinent family history. History  Substance Use Topics  . Smoking status: Current Every Day Smoker  . Smokeless tobacco: Never Used  . Alcohol Use: No   OB History   Grav Para Term Preterm Abortions TAB SAB Ect Mult Living                  Review of Systems  Constitutional: Positive for appetite change and fatigue. Negative for fever, chills, diaphoresis and activity change.  Respiratory: Negative for cough and shortness  of breath.   Cardiovascular: Negative for chest pain and leg swelling.  Gastrointestinal: Positive for nausea, vomiting, abdominal pain, diarrhea and blood in stool. Negative for constipation, anal bleeding and rectal pain.  Genitourinary: Negative for dysuria, decreased urine volume, vaginal bleeding, vaginal discharge, difficulty urinating and vaginal pain.  Musculoskeletal: Positive for back pain.  Neurological: Negative for dizziness, weakness, light-headedness and headaches.     Allergies  Shellfish allergy  Home Medications   Prior to Admission medications   Medication Sig Start Date End Date Taking? Authorizing Provider  Aspirin-Salicylamide-Caffeine (BC HEADACHE POWDER PO) Take 1 packet by mouth daily as needed (pain).   Yes Historical Provider, MD  OVER THE COUNTER MEDICATION Take 1-2 capsules by mouth every 2 (two) hours as needed (for diarrhea. Imodium).   Yes Historical Provider, MD   BP 114/69  Pulse 90  Temp(Src) 97.9 F (36.6 C)  Resp 22  SpO2 97%  Filed Vitals:   11/05/13 0941 11/05/13 1000 11/05/13 1045 11/05/13 1130  BP:  114/69 110/70 112/72  Pulse: 90 69 75 56  Temp: 97.9 F (36.6 C)     Resp: 22     SpO2: 97% 100% 99% 97%    Physical Exam  Nursing note and vitals reviewed. Constitutional: She is oriented to person, place, and time. She appears well-developed and well-nourished. No distress.  Anxious appearing  HENT:  Head: Normocephalic and atraumatic.  Right Ear: External ear normal.  Left Ear: External ear normal.  Nose: Nose normal.  Mouth/Throat: Oropharynx is clear and moist. No oropharyngeal exudate.  Eyes: Conjunctivae are normal. Pupils are equal, round, and reactive to light. Right eye exhibits no discharge. Left eye exhibits no discharge.  Neck: Normal range of motion. Neck supple.  Cardiovascular: Normal rate, regular rhythm, normal heart sounds and intact distal pulses.  Exam reveals no gallop and no friction rub.   No murmur  heard. Pulmonary/Chest: Effort normal and breath sounds normal. No respiratory distress. She has no wheezes. She has no rales. She exhibits no tenderness.  Abdominal: Soft. Bowel sounds are normal. She exhibits no distension and no mass. There is tenderness. There is no rebound and no guarding.  Mild epigastric tenderness to palpation  Musculoskeletal: Normal range of motion. She exhibits no edema and no tenderness.  No CVA, lumbar, or flank tenderness bilaterally.   Neurological: She is alert and oriented to person, place, and time.  Skin: Skin is warm and dry. She is not diaphoretic.    ED Course  Procedures (including critical care time) Labs Review Labs Reviewed  CBC WITH DIFFERENTIAL  COMPREHENSIVE METABOLIC PANEL  LIPASE, BLOOD  URINALYSIS, ROUTINE W REFLEX MICROSCOPIC  POC URINE PREG, ED    Imaging Review No results found.   EKG Interpretation None      Results for orders placed during the hospital encounter of 11/05/13  CBC WITH DIFFERENTIAL      Result Value Ref Range   WBC 9.3  4.0 - 10.5 K/uL   RBC 5.03  3.87 - 5.11 MIL/uL   Hemoglobin 15.5 (*) 12.0 - 15.0 g/dL   HCT 16.144.3  09.636.0 - 04.546.0 %   MCV 88.1  78.0 - 100.0 fL   MCH 30.8  26.0 - 34.0 pg   MCHC 35.0  30.0 - 36.0 g/dL   RDW 40.913.4  81.111.5 - 91.415.5 %   Platelets 325  150 - 400 K/uL   Neutrophils Relative % 76  43 - 77 %   Neutro Abs 7.1  1.7 - 7.7 K/uL   Lymphocytes Relative 17  12 - 46 %   Lymphs Abs 1.6  0.7 - 4.0 K/uL   Monocytes Relative 6  3 - 12 %   Monocytes Absolute 0.5  0.1 - 1.0 K/uL   Eosinophils Relative 1  0 - 5 %   Eosinophils Absolute 0.1  0.0 - 0.7 K/uL   Basophils Relative 0  0 - 1 %   Basophils Absolute 0.0  0.0 - 0.1 K/uL  COMPREHENSIVE METABOLIC PANEL      Result Value Ref Range   Sodium 138  137 - 147 mEq/L   Potassium 3.7  3.7 - 5.3 mEq/L   Chloride 103  96 - 112 mEq/L   CO2 20  19 - 32 mEq/L   Glucose, Bld 110 (*) 70 - 99 mg/dL   BUN 12  6 - 23 mg/dL   Creatinine, Ser 7.820.68  0.50  - 1.10 mg/dL   Calcium 9.4  8.4 - 95.610.5 mg/dL   Total Protein 8.5 (*) 6.0 - 8.3 g/dL   Albumin 4.3  3.5 - 5.2 g/dL   AST 24  0 - 37 U/L   ALT 27  0 - 35 U/L   Alkaline Phosphatase 69  39 - 117 U/L   Total Bilirubin 0.3  0.3 - 1.2 mg/dL   GFR calc non Af Amer >90  >90 mL/min   GFR calc Af Amer >90  >90 mL/min  LIPASE, BLOOD  Result Value Ref Range   Lipase 23  11 - 59 U/L     MDM   FABIHA ROUGEAU is a 23 y.o. female with a PMH of hemorrhoids and UTI who presents to the ED for evaluation of abdominal pain. Etiology of abdominal pain unclear. Patient recently seen in the ED for this 10/22/13 with unchanged similar pain today. Had CT scan on last visit which showed possible colitis. Patient given Cipro and flagyl. Did not follow-up after discharge. Labs today were unremarkable. Vital signs stable. Abdominal exam benign. Unable to get urine and occult stool studies or pelvic exam. Patient left AMA.    Rechecks  12:30 PM = Went back to check on patient. RN informed me patient wants to leave AMA. Did not get to talk to patient before discharge. Patient eloped.      Discharge Medication List as of 11/05/2013 12:39 PM       Final impressions: 1. Abdominal pain       Greer Ee Teresa Lemmerman PA-C         Jillyn Ledger, [place] 11/05/13 1610

## 2013-11-06 NOTE — ED Provider Notes (Signed)
Medical screening examination/treatment/procedure(s) were performed by non-physician practitioner and as supervising physician I was immediately available for consultation/collaboration.   EKG Interpretation None       Garnetta Fedrick, MD 11/06/13 0840 

## 2014-01-22 ENCOUNTER — Other Ambulatory Visit (HOSPITAL_COMMUNITY)
Admission: RE | Admit: 2014-01-22 | Discharge: 2014-01-22 | Disposition: A | Discharge status: Home / Self Care | Payer: BC Managed Care – PPO | Source: Ambulatory Visit | Attending: Obstetrics & Gynecology | Admitting: Obstetrics & Gynecology

## 2014-01-22 ENCOUNTER — Other Ambulatory Visit: Payer: Self-pay | Admitting: Obstetrics & Gynecology

## 2014-01-22 DIAGNOSIS — Z113 Encounter for screening for infections with a predominantly sexual mode of transmission: Secondary | ICD-10-CM | POA: Insufficient documentation

## 2014-01-22 DIAGNOSIS — Z01419 Encounter for gynecological examination (general) (routine) without abnormal findings: Secondary | ICD-10-CM | POA: Insufficient documentation

## 2014-01-23 LAB — CYTOLOGY - PAP

## 2014-01-29 ENCOUNTER — Ambulatory Visit (HOSPITAL_COMMUNITY)
Admission: RE | Admit: 2014-01-29 | Discharge: 2014-01-29 | Disposition: A | Discharge status: Home / Self Care | Payer: BC Managed Care – PPO | Source: Ambulatory Visit | Attending: Family Medicine | Admitting: Family Medicine

## 2014-01-29 ENCOUNTER — Other Ambulatory Visit (HOSPITAL_COMMUNITY): Payer: Self-pay | Admitting: Family Medicine

## 2014-01-29 DIAGNOSIS — N209 Urinary calculus, unspecified: Secondary | ICD-10-CM | POA: Insufficient documentation

## 2014-01-29 DIAGNOSIS — R109 Unspecified abdominal pain: Secondary | ICD-10-CM | POA: Insufficient documentation

## 2014-01-29 DIAGNOSIS — R1011 Right upper quadrant pain: Secondary | ICD-10-CM | POA: Insufficient documentation

## 2014-01-29 DIAGNOSIS — R3129 Other microscopic hematuria: Secondary | ICD-10-CM | POA: Insufficient documentation

## 2014-02-01 ENCOUNTER — Emergency Department (HOSPITAL_COMMUNITY)
Admission: EM | Admit: 2014-02-01 | Discharge: 2014-02-01 | Disposition: A | Discharge status: Home / Self Care | Payer: BC Managed Care – PPO | Attending: Emergency Medicine | Admitting: Emergency Medicine

## 2014-02-01 ENCOUNTER — Emergency Department (HOSPITAL_COMMUNITY): Payer: BC Managed Care – PPO

## 2014-02-01 DIAGNOSIS — Z8679 Personal history of other diseases of the circulatory system: Secondary | ICD-10-CM | POA: Insufficient documentation

## 2014-02-01 DIAGNOSIS — F17200 Nicotine dependence, unspecified, uncomplicated: Secondary | ICD-10-CM | POA: Diagnosis not present

## 2014-02-01 DIAGNOSIS — Z7982 Long term (current) use of aspirin: Secondary | ICD-10-CM | POA: Diagnosis not present

## 2014-02-01 DIAGNOSIS — R1011 Right upper quadrant pain: Secondary | ICD-10-CM | POA: Diagnosis not present

## 2014-02-01 DIAGNOSIS — R1031 Right lower quadrant pain: Secondary | ICD-10-CM | POA: Diagnosis present

## 2014-02-01 DIAGNOSIS — R109 Unspecified abdominal pain: Secondary | ICD-10-CM

## 2014-02-01 DIAGNOSIS — Z8742 Personal history of other diseases of the female genital tract: Secondary | ICD-10-CM | POA: Diagnosis not present

## 2014-02-01 DIAGNOSIS — Z3202 Encounter for pregnancy test, result negative: Secondary | ICD-10-CM | POA: Diagnosis not present

## 2014-02-01 LAB — COMPREHENSIVE METABOLIC PANEL
ALT: 100 U/L — ABNORMAL HIGH (ref 0–35)
ANION GAP: 15 (ref 5–15)
AST: 79 U/L — ABNORMAL HIGH (ref 0–37)
Albumin: 3.8 g/dL (ref 3.5–5.2)
Alkaline Phosphatase: 63 U/L (ref 39–117)
BUN: 13 mg/dL (ref 6–23)
CALCIUM: 9.3 mg/dL (ref 8.4–10.5)
CO2: 18 mEq/L — ABNORMAL LOW (ref 19–32)
Chloride: 104 mEq/L (ref 96–112)
Creatinine, Ser: 0.7 mg/dL (ref 0.50–1.10)
GFR calc non Af Amer: >90 mL/min (ref >90)
GLUCOSE: 113 mg/dL — AB (ref 70–99)
Potassium: 4.1 mEq/L (ref 3.7–5.3)
Sodium: 137 mEq/L (ref 137–147)
TOTAL PROTEIN: 7.4 g/dL (ref 6.0–8.3)
Total Bilirubin: 0.3 mg/dL (ref 0.3–1.2)

## 2014-02-01 LAB — URINALYSIS, ROUTINE W REFLEX MICROSCOPIC
BILIRUBIN URINE: NEGATIVE
Glucose, UA: NEGATIVE mg/dL
Hgb urine dipstick: NEGATIVE
Ketones, ur: NEGATIVE mg/dL
Leukocytes, UA: NEGATIVE
Nitrite: NEGATIVE
Protein, ur: NEGATIVE mg/dL
SPECIFIC GRAVITY, URINE: 1.008 (ref 1.005–1.030)
UROBILINOGEN UA: 0.2 mg/dL (ref 0.0–1.0)
pH: 8 (ref 5.0–8.0)

## 2014-02-01 LAB — CBC
HEMATOCRIT: 40.2 % (ref 36.0–46.0)
Hemoglobin: 13.8 g/dL (ref 12.0–15.0)
MCH: 30.5 pg (ref 26.0–34.0)
MCHC: 34.3 g/dL (ref 30.0–36.0)
MCV: 88.9 fL (ref 78.0–100.0)
Platelets: 277 10*3/uL (ref 150–400)
RBC: 4.52 MIL/uL (ref 3.87–5.11)
RDW: 13.3 % (ref 11.5–15.5)
WBC: 9.9 10*3/uL (ref 4.0–10.5)

## 2014-02-01 LAB — POC URINE PREG, ED: PREG TEST UR: NEGATIVE

## 2014-02-01 MED ORDER — HYDROMORPHONE HCL PF 1 MG/ML IJ SOLN
1.0000 mg | Freq: Once | INTRAMUSCULAR | Status: AC
  Administered 2014-02-01: 1 mg via INTRAVENOUS
  Filled 2014-02-01: qty 1

## 2014-02-01 MED ORDER — OXYCODONE-ACETAMINOPHEN 5-325 MG PO TABS: 1.0000 | ORAL_TABLET | ORAL | Status: DC | PRN

## 2014-02-01 MED ORDER — PHENAZOPYRIDINE HCL 100 MG PO TABS
200.0000 mg | ORAL_TABLET | Freq: Once | ORAL | Status: AC
  Administered 2014-02-01: 200 mg via ORAL
  Filled 2014-02-01: qty 2

## 2014-02-01 MED ORDER — KETOROLAC TROMETHAMINE 30 MG/ML IJ SOLN
30.0000 mg | Freq: Once | INTRAMUSCULAR | Status: AC
  Administered 2014-02-01: 30 mg via INTRAVENOUS
  Filled 2014-02-01: qty 1

## 2014-02-01 MED ORDER — ONDANSETRON HCL 4 MG/2ML IJ SOLN
4.0000 mg | Freq: Once | INTRAMUSCULAR | Status: AC
  Administered 2014-02-01: 4 mg via INTRAVENOUS
  Filled 2014-02-01: qty 2

## 2014-02-01 MED ORDER — KETOROLAC TROMETHAMINE 60 MG/2ML IM SOLN: 60.0000 mg | Freq: Once | INTRAMUSCULAR | Status: DC

## 2014-02-01 MED ORDER — TAMSULOSIN HCL 0.4 MG PO CAPS
0.8000 mg | ORAL_CAPSULE | Freq: Once | ORAL | Status: AC
  Administered 2014-02-01: 0.8 mg via ORAL
  Filled 2014-02-01: qty 2

## 2014-02-01 MED ORDER — TAMSULOSIN HCL 0.4 MG PO CAPS: 0.4000 mg | ORAL_CAPSULE | Freq: Two times a day (BID) | ORAL | Status: DC

## 2014-02-01 MED ORDER — HYDROMORPHONE HCL PF 1 MG/ML IJ SOLN
1.0000 mg | Freq: Once | INTRAMUSCULAR | Status: DC
  Filled 2014-02-01: qty 1

## 2014-02-01 MED ORDER — PHENAZOPYRIDINE HCL 200 MG PO TABS
200.0000 mg | ORAL_TABLET | Freq: Three times a day (TID) | ORAL | Status: DC
Start: 2014-02-01 — End: 2014-09-07

## 2014-02-01 MED ORDER — FENTANYL CITRATE 0.05 MG/ML IJ SOLN
50.0000 ug | Freq: Once | INTRAMUSCULAR | Status: AC
  Administered 2014-02-01: 50 ug via INTRAVENOUS
  Filled 2014-02-01: qty 2

## 2014-02-01 NOTE — ED Notes (Signed)
Pt more comfortable, not writhing in pain.

## 2014-02-01 NOTE — Discharge Instructions (Signed)
Ureteral Colic (Kidney Stones) °Ureteral colic is the result of a condition when kidney stones form inside the kidney. Once kidney stones are formed they may move into the tube that connects the kidney with the bladder (ureter). If this occurs, this condition may cause pain (colic) in the ureter.  °CAUSES  °Pain is caused by stone movement in the ureter and the obstruction caused by the stone. °SYMPTOMS  °The pain comes and goes as the ureter contracts around the stone. The pain is usually intense, sharp, and stabbing in character. The location of the pain may move as the stone moves through the ureter. When the stone is near the kidney the pain is usually located in the back and radiates to the belly (abdomen). When the stone is ready to pass into the bladder the pain is often located in the lower abdomen on the side the stone is located. At this location, the symptoms may mimic those of a urinary tract infection with urinary frequency. Once the stone is located here it often passes into the bladder and the pain disappears completely. °TREATMENT  °· Your caregiver will provide you with medicine for pain relief. °· You may require specialized follow-up X-rays. °· The absence of pain does not always mean that the stone has passed. It may have just stopped moving. If the urine remains completely obstructed, it can cause loss of kidney function or even complete destruction of the involved kidney. It is your responsibility and in your interest that X-rays and follow-ups as suggested by your caregiver are completed. Relief of pain without passage of the stone can be associated with severe damage to the kidney, including loss of kidney function on that side. °· If your stone does not pass on its own, additional measures may be taken by your caregiver to ensure its removal. °HOME CARE INSTRUCTIONS  °· Increase your fluid intake. Water is the preferred fluid since juices containing vitamin C may acidify the urine making it  less likely for certain stones (uric acid stones) to pass. °· Strain all urine. A strainer will be provided. Keep all particulate matter or stones for your caregiver to inspect. °· Take your pain medicine as directed. °· Make a follow-up appointment with your caregiver as directed. °· Remember that the goal is passage of your stone. The absence of pain does not mean the stone is gone. Follow your caregiver's instructions. °· Only take over-the-counter or prescription medicines for pain, discomfort, or fever as directed by your caregiver. °SEEK MEDICAL CARE IF:  °· Pain cannot be controlled with the prescribed medicine. °· You have a fever. °· Pain continues for longer than your caregiver advises it should. °· There is a change in the pain, and you develop chest discomfort or constant abdominal pain. °· You feel faint or pass out. °MAKE SURE YOU:  °· Understand these instructions. °· Will watch your condition. °· Will get help right away if you are not doing well or get worse. °Document Released: 03/22/2005 Document Revised: 10/07/2012 Document Reviewed: 12/07/2010 °ExitCare® Patient Information ©2015 [employer]. This information is not intended to replace advice given to you by your health care provider. Make sure you discuss any questions you have with your health care provider. ° °

## 2014-02-01 NOTE — ED Notes (Signed)
Went to [facility] x 2 days ago for kidney stones. They sent her here the same for scans to confirm kidney stone. Prescribed vicodin and not working. Has not voided for 6 hrs. Now.

## 2014-02-01 NOTE — ED Provider Notes (Signed)
CSN: [id number]     Arrival date & time 02/01/14  0550 History   First MD Initiated Contact with Patient 02/01/14 [id number]     Chief Complaint  Patient presents with  . Flank Pain     (Consider location/radiation/quality/duration/timing/severity/associated sxs/prior Treatment) HPI  Abigail Gray This 23 year old female who presents to the emergency department chief complaint of flank pain. The patient was seen by her primary care physician and [facility]. Patient was diagnosed with his hematuria secondary to a right-sided kidney stone seen on CT scan. Review of EMR imaging shows 3 mm stone in the distal ureter at that time. The patient states she was sent home with Norco and ibuprofen. She was told to drink plenty of fluids. The patient states that her pain has been very poorly controlled at home. She's had multiple episodes of nausea and vomiting. She denies a previous history of kidney stones but does have a history of recurrent urinary tract infections and pyelonephritis. The patient currently rates her pain at 10 out of 10, flank pain which radiates to the right lower quadrant of her abdomen. Nausea is well controlled at this point after Zofran ODT. She denies fevers, chills, myalgias, arthralgias, vaginal symptoms. Last menstrual period ended yesterday and started 2 days prior.  Past Medical History  Diagnosis Date  . Hemorrhoids   . UTI (lower urinary tract infection)    No past surgical history on file. No family history on file. History  Substance Use Topics  . Smoking status: Current Every Day Smoker  . Smokeless tobacco: Never Used  . Alcohol Use: No   OB History   Grav Para Term Preterm Abortions TAB SAB Ect Mult Living                 Review of Systems  Ten systems reviewed and are negative for acute change, except as noted in the HPI.    Allergies  Shellfish allergy  Home Medications   Prior to Admission medications   Medication Sig Start Date End Date Taking?  Authorizing Provider  Aspirin-Salicylamide-Caffeine (BC HEADACHE POWDER PO) Take 1 packet by mouth daily as needed (pain).    Historical Provider, MD  OVER THE COUNTER MEDICATION Take 1-2 capsules by mouth every 2 (two) hours as needed (for diarrhea. Imodium).    Historical Provider, MD   BP 127/93  Pulse 85  Temp(Src) 98.4 F (36.9 C) (Oral)  Resp 24  Ht 5\' 3"  (1.6 m)  Wt 180 lb (81.647 kg)  BMI 31.89 kg/m2  SpO2 100%  LMP 01/24/2014 Physical Exam Physical Exam  Nursing note and vitals reviewed. Constitutional: She is oriented to person, place, and time.  The patient is tearful, walking on the bed. She appears very uncomfortable, appears to be unable to find a comfortable position. HENT:  Head: Normocephalic and atraumatic.  Eyes: Conjunctivae normal and EOM are normal. Pupils are equal, round, and reactive to light. No scleral icterus.  Neck: Normal range of motion.  Cardiovascular: Normal rate, regular rhythm and normal heart sounds.  Exam reveals no gallop and no friction rub.   No murmur heard. Pulmonary/Chest: Effort normal and breath sounds normal. No respiratory distress.  Abdominal: Soft. Bowel sounds are normal. She exhibits no distension and no mass. Tender to palpation right lower quadrant and right upper quadrant. Positive CVA tenderness on the right. Neurological: She is alert and oriented to person, place, and time.  Skin: Skin is warm and dry. She is not diaphoretic.    ED  Course  Procedures (including critical care time) Labs Review Labs Reviewed  COMPREHENSIVE METABOLIC PANEL - Abnormal; Notable for the following:    CO2 18 (*)    Glucose, Bld 113 (*)    AST 79 (*)    ALT 100 (*)    All other components within normal limits  CBC  URINALYSIS, ROUTINE W REFLEX MICROSCOPIC  POC URINE PREG, ED    Imaging Review US Renal  02/01/2014   CLINICAL DATA:  Right flank pain; known right ureteral stone.  EXAM: RENAL/URINARY TRACT ULTRASOUND COMPLETE  COMPARISON:   CT of the abdomen and pelvis from 01/29/2014  FINDINGS: Right Kidney:  Length: 10.8 cm. Echogenicity within normal limits. Slightly increased right-sided pelvicaliectasis is noted, without significant hydronephrosis.  Left Kidney:  Length: 11.2 cm. Echogenicity within normal limits. No mass or hydronephrosis visualized.  Bladder:  Appears normal for degree of bladder distention. Bilateral ureteral jets are seen.  IMPRESSION: Bilateral ureteral jets seen. There is slightly increased right-sided pelvicaliectasis, without significant hydronephrosis. The previously noted tiny distal right ureteral stone may still be present, but is not causing significant obstruction at this time.   Electronically Signed   By: Roanna Raider M.D.   On: 02/01/2014 07:12     EKG Interpretation None      MDM   Final diagnoses:  Flank pain    7:27 AM BP 127/93  Pulse 85  Temp(Src) 98.4 F (36.9 C) (Oral)  Resp 24  Ht 5\' 3"  (1.6 m)  Wt 180 lb (81.647 kg)  BMI 31.89 kg/m2  SpO2 100%  LMP 01/24/2014 The patient signs within normal limits. CBC shows no elevated white count. She does appear to have transaminitis. Bicarbonate is slightly decreased at 18 without an anion gap. Slightly elevated glucose. Ultrasound shows right-sided dilated renal pelvis and calyces. No significant dilitation of the ureter noted. Pain poorly controlled here in the ED after 1 mg IV Dilaudid and 30 mg IV Toradol.    10:32 AM BP 113/50  Pulse 42  Temp(Src) 98.4 F (36.9 C) (Oral)  Resp 26  Ht 5\' 3"  (1.6 m)  Wt 180 lb (81.647 kg)  BMI 31.89 kg/m2  SpO2 98%  LMP 01/24/2014 Patient pain well controlled in the ED. No vomiting. Tolerating fluids.No infection on UA. C/o urgency to urinate and minimal output. I have dosed the patient with pyridium D/c with change in pain meds to percocet. Flomax, and pyridium. Discussed return precautions.  Arthor Captain, PA-C 02/03/14 1722

## 2014-02-01 NOTE — ED Notes (Signed)
Pt states feeling nauseated-- needs to urinate also

## 2014-02-03 ENCOUNTER — Emergency Department: Payer: BC Managed Care – PPO

## 2014-02-03 ENCOUNTER — Emergency Department: Admission: EM | Admit: 2014-02-03 | Discharge status: Absent without leave | Payer: Self-pay | Source: Home / Self Care

## 2014-02-03 ENCOUNTER — Emergency Department
Admission: EM | Admit: 2014-02-03 | Discharge: 2014-02-03 | Disposition: A | Discharge status: Home / Self Care | Payer: BC Managed Care – PPO | Attending: Emergency Medicine | Admitting: Emergency Medicine

## 2014-02-03 ENCOUNTER — Emergency Department: Payer: Self-pay

## 2014-02-03 DIAGNOSIS — R69 Illness, unspecified: Secondary | ICD-10-CM | POA: Insufficient documentation

## 2014-02-03 DIAGNOSIS — N1330 Unspecified hydronephrosis: Secondary | ICD-10-CM | POA: Insufficient documentation

## 2014-02-03 DIAGNOSIS — N200 Calculus of kidney: Secondary | ICD-10-CM | POA: Insufficient documentation

## 2014-02-03 LAB — LIPASE: Lipase: 16 U/L (ref 8–78)

## 2014-02-03 LAB — COMPREHENSIVE METABOLIC PANEL
ALT: 71 U/L — ABNORMAL HIGH (ref 0–55)
AST (SGOT): 37 U/L — ABNORMAL HIGH (ref 5–34)
Albumin/Globulin Ratio: 1.05 Ratio (ref 0.70–1.50)
Albumin: 4.1 gm/dL (ref 3.5–5.0)
Alkaline Phosphatase: 68 U/L (ref 40–150)
Anion Gap: 16 mMol/L (ref 7.0–18.0)
BUN / Creatinine Ratio: 11.3 Ratio (ref 10.0–30.0)
BUN: 9 mg/dL (ref 6–20)
Bilirubin, Total: 0.5 mg/dL (ref 0.1–1.0)
CO2: 18.6 mMol/L — ABNORMAL LOW (ref 20.0–30.0)
Calcium: 9.5 mg/dL (ref 8.4–10.2)
Chloride: 107 mMol/L (ref 98–112)
Creatinine: 0.8 mg/dL (ref 0.57–1.11)
EGFR: >60 mL/min/{1.73_m2}
Globulin: 3.9 gm/dL (ref 2.0–4.0)
Glucose: 128 mg/dL — ABNORMAL HIGH (ref 70–99)
Osmolality Calc: 277 mOsm/kg (ref 275–300)
Potassium: 3.6 mMol/L (ref 3.5–5.3)
Protein, Total: 8 gm/dL (ref 6.3–8.2)
Sodium: 138 mMol/L (ref 136–145)

## 2014-02-03 LAB — CBC AND DIFFERENTIAL
Basophils %: 0.7 % (ref 0.0–3.0)
Basophils Absolute: 0.1 10*3/uL (ref 0.0–0.3)
Eosinophils %: 1.4 % (ref 0.0–7.0)
Eosinophils Absolute: 0.1 10*3/uL (ref 0.0–0.8)
Hematocrit: 41.3 % (ref 36.0–48.0)
Hemoglobin: 14.1 gm/dL (ref 12.0–16.0)
Lymphocytes Absolute: 3.1 10*3/uL (ref 0.6–5.1)
Lymphocytes: 31 % (ref 15.0–46.0)
MCH: 30 pg (ref 28–35)
MCHC: 34 gm/dL (ref 31–36)
MCV: 87 fL (ref 80–100)
MPV: 7.7 fL (ref 6.0–10.0)
Monocytes Absolute: 0.6 10*3/uL (ref 0.1–1.7)
Monocytes: 5.8 % (ref 3.0–15.0)
Neutrophils %: 61.1 % (ref 42.0–78.0)
Neutrophils Absolute: 6.1 10*3/uL (ref 1.7–8.6)
PLT CT: 336 10*3/uL (ref 130–440)
RBC: 4.73 10*6/uL (ref 3.80–5.00)
RDW: 12.1 % (ref 10.5–14.5)
WBC: 10 10*3/uL (ref 4.00–11.00)

## 2014-02-03 LAB — URINALYSIS
Glucose, UA: 100 mg/dL — AB
Ictotest: NEGATIVE
Ketones UA: 15 mg/dL — AB
Nitrite, UA: POSITIVE — AB
Protein, UR: 100 mg/dL — AB
Urine Specific Gravity: 1.025 (ref 1.001–1.040)
Urobilinogen, UA: 1 mg/dL — AB
pH, Urine: 5.5 pH (ref 5.0–8.0)

## 2014-02-03 LAB — HCG, SERUM, QUALITATIVE: BHCG Qualitative: NEGATIVE

## 2014-02-03 LAB — VH URINE DRUG SCREEN
Amphetamine: NEGATIVE
Barbiturates: NEGATIVE
Buprenorphine, Urine: NEGATIVE
Cannabinoids: POSITIVE — AB
Cocaine: NEGATIVE
Methamphetamine: NEGATIVE
Opiates: POSITIVE — AB
Phencyclidine: NEGATIVE
Propoxyphene: NEGATIVE
Urine Benzodiazepines: NEGATIVE
Urine Methadone Screen: NEGATIVE
Urine Oxycodone: POSITIVE — AB
Urine Tricyclics: NEGATIVE

## 2014-02-03 MED ORDER — KETOROLAC TROMETHAMINE 30 MG/ML IJ SOLN
INTRAMUSCULAR | Status: AC
Start: 2014-02-03 — End: ?
  Filled 2014-02-03: qty 1

## 2014-02-03 MED ORDER — TAMSULOSIN HCL 0.4 MG PO CAPS
ORAL_CAPSULE | ORAL | Status: AC
Start: 2014-02-03 — End: ?
  Filled 2014-02-03: qty 1

## 2014-02-03 MED ORDER — ONDANSETRON HCL 4 MG/2ML IJ SOLN
INTRAMUSCULAR | Status: AC
Start: 2014-02-03 — End: ?
  Filled 2014-02-03: qty 2

## 2014-02-03 MED ORDER — TAMSULOSIN HCL 0.4 MG PO CAPS
0.4000 mg | ORAL_CAPSULE | ORAL | Status: DC
Start: 2014-02-03 — End: 2014-02-03

## 2014-02-03 MED ORDER — ONDANSETRON HCL 4 MG/2ML IJ SOLN
4.0000 mg | Freq: Once | INTRAMUSCULAR | Status: AC
Start: 2014-02-03 — End: 2014-02-03
  Administered 2014-02-03: 4 mg via INTRAVENOUS

## 2014-02-03 MED ORDER — SODIUM CHLORIDE 0.9 % IV BOLUS
500.0000 mL | Freq: Once | INTRAVENOUS | Status: AC
Start: 2014-02-03 — End: 2014-02-03
  Administered 2014-02-03: 500 mL via INTRAVENOUS

## 2014-02-03 MED ORDER — VH HYDROMORPHONE HCL 1 MG/ML (NARRATOR)
INTRAMUSCULAR | Status: AC
Start: 2014-02-03 — End: ?
  Filled 2014-02-03: qty 1

## 2014-02-03 MED ORDER — VH HYDROMORPHONE HCL PF 1 MG/ML CARPUJECT
1.0000 mg | Freq: Once | INTRAMUSCULAR | Status: AC
Start: 2014-02-03 — End: 2014-02-03
  Administered 2014-02-03: 1 mg via INTRAVENOUS

## 2014-02-03 MED ORDER — KETOROLAC TROMETHAMINE 30 MG/ML IJ SOLN
30.0000 mg | Freq: Once | INTRAMUSCULAR | Status: AC
Start: 2014-02-03 — End: 2014-02-03
  Administered 2014-02-03: 30 mg via INTRAVENOUS

## 2014-02-03 NOTE — ED Notes (Signed)
Right flank and abd pain.  [place]  and told she had a kidney stone.  Started with severe pain 2 hrs ago.

## 2014-02-03 NOTE — ED Provider Notes (Signed)
Physician/Midlevel provider first contact with patient: 02/03/14 0535         [facility] EMERGENCY DEPARTMENT HISTORY AND PHYSICAL EXAM      Patient Name: Becky Quinn, Becky Quinn  Encounter Date:  02/03/2014  ED Provider: Gladis Riffle, M.D.  PCP: No primary care provider on file.  Patient DOB:  [birth year]  MRN:  [id number]  Room:  EX7/EX7-A      History of Presenting Illness:   Chief complaint: Flank Pain and Abdominal Pain    HPI/ROS is limited by: none  HPI/ROS given by: patient    Location: home  Duration: 3 weeks  Severity: severe    Becky Quinn is a 23 y.o. female who presents with right flank pain for 3 weeks. Patient states she was in [place] where she is from and was diagnosed with a kidney stone 3 weeks ago was given Flomax and hydrocodone the pain is waxed and waned was seeming to get better and then the pain was severe starting about 2-3 hours ago no associated fevers dysuria vaginal bleeding or discharge patient has no medical problems.    Past Medical History:   History reviewed. No pertinent past medical history.    Past Surgical History:   History reviewed. No pertinent past surgical history.    Family History:   No family history on file.    Social History:     History     Social History   . Marital Status: Single     Spouse Name: N/A     Number of Children: N/A   . Years of Education: N/A     Social History Main Topics   . Smoking status: Not on file   . Smokeless tobacco: Not on file   . Alcohol Use: Not on file   . Drug Use: Not on file   . Sexual Activity: Not on file     Other Topics Concern   . Not on file     Social History Narrative   . No narrative on file       Allergies:     Allergies   Allergen Reactions   . Shellfish-Derived Products Hives and Swelling       Medications:     Previous Medications    HYDROCODONE-ACETAMINOPHEN (NORCO) 5-325 MG PER TABLET    Take 1 tablet by mouth every 4 (four) hours as needed.       MICROGESTIN FE 1/20 1-20 MG-MCG PER TABLET    Take 1 tablet by mouth daily.            Review of Systems:       Cardiovascular: No chest pain.  No palpitations.    Respiratory: No cough.  No shortness of breath.    GU:  No dysuria.    Neurological:  No headache.  No weakness.    Musculoskeletal: Right flank pain    Skin:  No rash.  No skin lesions.    Endocrine:  No weight change    Psychiatric:  No depression.  No anxiety.      All other systems reviewed and negative except as above, pertinent findings in HPI.      Physical Exam:   Triage vitals  ED Triage Vitals   Enc Vitals Group      BP --       Heart Rate 02/03/14 0532 134      Resp Rate 02/03/14 0532 18      Temp 02/03/14 0532 98.2 F (36.8 C)  Temp src --       SpO2 02/03/14 0532 98 %      Weight 02/03/14 0532 81.647 kg      Height 02/03/14 0532 1.6 m      Head Cir --       Peak Flow --       Pain Score --       Pain Loc --       Pain Edu? --       Excl. in GC? --       most recent vitalsBP 130/85 mmHg  Pulse 102  Temp(Src) 98.2 F (36.8 C)  Resp 18  Ht 1.6 m  Wt 81.647 kg  BMI 31.89 kg/m2  SpO2 99%  LMP 01/24/2014 (Exact Date)    Constitutional:  Vitals signs reviewed. Severe distress.    Head:  Atraumatic, normocephalic    Eyes:  Pupils equal, round, and reactive to light.  Conjunctiva clear. No injection.    Throat:  Oropharynx clear.  No erythema.  No exudates     Neck:  Supple, non tender.  No cervical lymphadenopathy.    Respiratory:  Breath sounds normal.  No distress    Chest:  Non tender    Cardiovascular:  Heart regular rate and rhythm.  No murmurs/gallops/rubs.  Pulses +2 bilaterally    Abdomen:  Soft. Non-tender.  Normal bowel sounds. No distension. No bruits.    Back:  No CVA tenderness bilaterally    Extremities:  Full range of motion.  No edema.  No cyanosis.  No deformity.    Skin:  Warm.  Dry.  No pallor.  No rashes.  No lesions.  No bruises    Neurological:  Alert. Oriented to person,place, time.  GCS 15.  No focal motor deficits.     Psychiatric:  Normal affect.  No anxiety.  No depression.  No  agitation.        Orders Placed:     Orders Placed This Encounter   Procedures   . CT Abdomen Pelvis dry  (Stone)   . CBC   . CMP   . Lipase   . Beta HCG, Qual   . Urinalysis   . Urine Drug Screen   . Saline lock IV #1       Diagnostic Results:     The results of the diagnostic studies below have been reviewed by myself:    Labs  Results    Procedure Component Value Units Date/Time    Urinalysis [[id number]]  (Abnormal) Collected:  02/03/14 0543    Specimen Information:  Urine / Urine, Random Updated:  02/03/14 0606     Color, UA Dark-Yellow (A)      Clarity, UA Slightly Cloudy (A)      Specific Gravity, UR 1.025      pH, Urine 5.5 pH      Protein, UR 100 (A) mg/dL      Glucose, UA 409 (A) mg/dL      Ketones UA 15 (A) mg/dL      Bilirubin, UA Small (A) mg/dL      Blood, UA Moderate (A) mg/dL      Nitrite, UA Positive (A)      Urobilinogen, UA 1.0 (A) mg/dL      Leukocyte Esterase, UA Trace (A) Leu/uL      WBC, UA 1-2 /hpf      RBC, UA 3-4 /hpf      Bacteria, UA Rare (A) /hpf  Squam Epithel, UA 1-5 /lpf      Ictotest Negative     Beta HCG, Drema Dallas [[id number]] Collected:  02/03/14 0540    Specimen Information:  Blood / Plasma Updated:  02/03/14 0603     BHCG Qual Negative     CMP [[id number]]  (Abnormal) Collected:  02/03/14 0540    Specimen Information:  Blood / Plasma Updated:  02/03/14 0603     Sodium 138 mMol/L      Potassium 3.6 mMol/L      Chloride 107 mMol/L      CO2 18.6 (L) mMol/L      CALCIUM 9.5 mg/dL      Glucose 132 (H) mg/dL      Creatinine 4.40 mg/dL      BUN 9 mg/dL      Protein, Total 8.0 gm/dL      Albumin 4.1 gm/dL      Alkaline Phosphatase 68 U/L      ALT 71 (H) U/L      AST (SGOT) 37 (H) U/L      Bilirubin, Total 0.5 mg/dL      Albumin/Globulin Ratio 1.05 Ratio      Anion Gap 16.0 mMol/L      BUN/Creatinine Ratio 11.3 Ratio      EGFR >60 mL/min/1.74m2      Osmolality Calc 277 mOsm/kg      Globulin 3.9 gm/dL     Lipase [[id number]] Collected:  02/03/14 0540    Specimen Information:  Blood / Plasma  Updated:  02/03/14 0603     Lipase 16 U/L     Urine Drug Screen [[id number]]  (Abnormal) Collected:  02/03/14 0543    Specimen Information:  Urine, Random Updated:  02/03/14 0601     Cannabinoids Positive (A)      Phencyclidine Negative      Cocaine Negative      Methamphetamine Negative      Opiates Positive (A)      Amphetamine Negative      Benzodiazepines Negative      Tricyclics Negative      Methadone Screen, Urine Negative      Barbiturates Negative      OXYCODONE, URINE Positive (A)      PROPOXYPHENE Negative      Buprenorphine, Urine Negative     CBC [[id number]] Collected:  02/03/14 0540    Specimen Information:  Blood / Blood Updated:  02/03/14 0600     WBC 10.0 K/cmm      RBC 4.73 M/cmm      Hemoglobin 14.1 gm/dL      Hematocrit 33.2 %      MCV 87 fL      MCH 30 pg      MCHC 34 gm/dL      RDW 95.1 %      PLT CT 336 K/cmm      MPV 7.7 fL      NEUTROPHIL % 61.1 %      Lymphocytes 31.0 %      Monocytes 5.8 %      Eosinophils % 1.4 %      Basophils % 0.7 %      Neutrophils Absolute 6.1 K/cmm      Lymphocytes Absolute 3.1 K/cmm      Monocytes Absolute 0.6 K/cmm      Eosinophils Absolute 0.1 K/cmm      BASO Absolute 0.1 K/cmm           Radiologic Studies  Radiology Results (  24 Hour)    Procedure Component Value Units Date/Time    CT Abdomen Pelvis dry  Larina Bras) [[id number]]     Order Status:  Sent Updated:  02/03/14 4098          EKG: none    Procedure:   none  0620 increased pain additional pain medication ordered.  Waiting ct results.  645 increased pain CT shows a partially obstructing right distal ureteral calculus per the radiologist with mild right Hydro ureterohydronephrosis   0720  patient feels better she still does not want to go to [facility] to see a urologist. Patient was told that is where she needs to go if she has uncontrolled pain. Patient has a pain medication prescription Flomax and Zofran for nausea at home.  Patient was told what to watch for.  Assessment/Plan:   This patient presents to the  Emergency Department with flank pain.  The patient underwent evaluation and treatment and initial differential diagnosis included but was not limited to kidney stone, AAA, dissection, musculoskeletal back pain, and other causes of intra-abdominal pathology.  Life-threatening or serious causes of this patients pain were thought unlikely.  The patient underwent evaluation and treatment and their symptoms have improved.  They have no signs of sepsis or acutely worsening kidney failure.  Their pain is controlled and they are able to keep fluids down. The patient was warned to return immediately for fever/chills, intractable pain, persistent vomiting, worsening symptoms or any concerns.  Oral hydration and close re-evaluation with a specialist is recommended.  Diagnostic impression and plan were discussed with the patient and/or family.  Results of lab/radiology tests were discussed with the patient and/or family.  All questions were answered and concerns addressed.                  Diagnosis / Disposition     Clinical Impression  1. Kidney stone        Disposition  ED Disposition    Discharge Alen Bleacher discharge to home/self care.    Condition at disposition: Stable            Prescriptions  New Prescriptions    No medications on file                         Suella Broad, MD  02/03/14 [phone]

## 2014-02-03 NOTE — ED Notes (Signed)
Female friend arrives to drive patient home. Discharge explained to patient and responsible party.

## 2014-02-03 NOTE — Discharge Instructions (Signed)
Kidney Stone (W/ Colic)    The sharp cramping pain and nausea/vomiting that you have is due to a small stone which has formed in the kidney and is now passing down a narrow tube (ureter) on its way to your bladder. Once it reaches your bladder, the pain will stop. The stone may pass in your urine stream in one piece. [The size may be 1/16" to 1/4" (1-6mm)]. Or, the stone may also break up into sandy fragments which you may not even notice.  Once you have had a kidney stone, you are at risk for developing another one in the future.  Home Care:   Drink plenty of fluids (at least 8 to 10 glasses of water a day).   Most stones will pass on their own, but may take from a few hours to a few days. Sometimes the stone is too large to pass by itself and special methods will have to be used to remove the stone.   Each time you urinate, do so in a jar. Pour the urine from the jar through the strainer and into the toilet. Continue doing this until 24 hours after your pain stops. By then, if there was a kidney stone, it should pass from your bladder. Some stones dissolve into sand-like particles and pass right through the strainer. In that case, you won't ever see a stone.   Save any stone that you find in the strainer and bring it to your doctor for analysis. It may be possible to prevent certain types of stones from forming. Therefore, it is important to know what kind of stone you have.   Try to stay as active as possible since this will help the stone pass. Do not stay in bed unless your pain prevents you from getting up. You may notice a red, pink or Kuras color to your urine. This is normal while passing a kidney stone.  Follow Up  with your doctor or return to this facility if the pain lasts more than 48 hours.  Get Prompt Medical Attention  if any of the following occur:   Pain that is not controlled by the medicine given   Repeated vomiting or unable to keep down fluids   Weakness, dizziness or  fainting   Fever of 100.4F (38C) or higher, or as directed by your healthcare provider   Passage of solid red or Miracle urine (can't see through it) or urine with lots of blood clots   Unable to pass urine for 8 hours and increasing bladder pressure   2000-2014 [employer] [address], [place] [address]. All rights reserved. This information is not intended as a substitute for professional medical care. Always follow your healthcare professional's instructions.

## 2014-02-03 NOTE — ED Notes (Signed)
Patient resting with eyes closed, NAD.  Family called to pick up by patient

## 2014-02-03 NOTE — ED Notes (Signed)
Pt tearful.  Said pain is a 10 again

## 2014-02-03 NOTE — ED Notes (Signed)
Pt vomited small amount clear and pink fluid.

## 2014-02-05 NOTE — ED Provider Notes (Signed)
Medical screening examination/treatment/procedure(s) were performed by non-physician practitioner and as supervising physician I was immediately available for consultation/collaboration.   EKG Interpretation None        Gilda Creasehristopher J. Anona Giovannini, MD 02/05/14 [id number]

## 2014-02-09 ENCOUNTER — Emergency Department (HOSPITAL_COMMUNITY)
Admission: EM | Admit: 2014-02-09 | Discharge: 2014-02-09 | Disposition: A | Discharge status: Home / Self Care | Payer: BC Managed Care – PPO

## 2014-02-09 ENCOUNTER — Emergency Department (HOSPITAL_COMMUNITY)
Admission: EM | Admit: 2014-02-09 | Discharge: 2014-02-09 | Discharge status: Against Medical Advice | Payer: BC Managed Care – PPO | Attending: Emergency Medicine | Admitting: Emergency Medicine

## 2014-02-09 ENCOUNTER — Encounter (HOSPITAL_COMMUNITY): Payer: Self-pay | Admitting: Emergency Medicine

## 2014-02-09 DIAGNOSIS — F17200 Nicotine dependence, unspecified, uncomplicated: Secondary | ICD-10-CM | POA: Insufficient documentation

## 2014-02-09 DIAGNOSIS — N200 Calculus of kidney: Secondary | ICD-10-CM | POA: Diagnosis not present

## 2014-02-09 NOTE — ED Notes (Signed)
Unable to locate patient for triage x3, pager found in a chair

## 2014-02-09 NOTE — ED Notes (Signed)
Pt just at [facility] left due to long wait time, pt states she has kidney stone.

## 2014-02-09 NOTE — ED Notes (Signed)
Called for second time without response from lobby 

## 2014-04-10 ENCOUNTER — Other Ambulatory Visit: Payer: Self-pay

## 2014-07-08 ENCOUNTER — Emergency Department (HOSPITAL_COMMUNITY)
Admission: EM | Admit: 2014-07-08 | Discharge: 2014-07-08 | Disposition: A | Discharge status: Home / Self Care | Payer: BLUE CROSS/BLUE SHIELD | Attending: Emergency Medicine | Admitting: Emergency Medicine

## 2014-07-08 ENCOUNTER — Emergency Department (HOSPITAL_COMMUNITY): Payer: BLUE CROSS/BLUE SHIELD

## 2014-07-08 ENCOUNTER — Encounter (HOSPITAL_COMMUNITY): Payer: Self-pay | Admitting: Emergency Medicine

## 2014-07-08 DIAGNOSIS — S20212A Contusion of left front wall of thorax, initial encounter: Secondary | ICD-10-CM | POA: Insufficient documentation

## 2014-07-08 DIAGNOSIS — R112 Nausea with vomiting, unspecified: Secondary | ICD-10-CM | POA: Insufficient documentation

## 2014-07-08 DIAGNOSIS — Y998 Other external cause status: Secondary | ICD-10-CM | POA: Diagnosis not present

## 2014-07-08 DIAGNOSIS — S27329A Contusion of lung, unspecified, initial encounter: Secondary | ICD-10-CM | POA: Insufficient documentation

## 2014-07-08 DIAGNOSIS — Y92410 Unspecified street and highway as the place of occurrence of the external cause: Secondary | ICD-10-CM | POA: Insufficient documentation

## 2014-07-08 DIAGNOSIS — S3992XA Unspecified injury of lower back, initial encounter: Secondary | ICD-10-CM | POA: Insufficient documentation

## 2014-07-08 DIAGNOSIS — Y9389 Activity, other specified: Secondary | ICD-10-CM | POA: Diagnosis not present

## 2014-07-08 DIAGNOSIS — K08119 Complete loss of teeth due to trauma, unspecified class: Secondary | ICD-10-CM | POA: Insufficient documentation

## 2014-07-08 DIAGNOSIS — S20302A Unspecified superficial injuries of left front wall of thorax, initial encounter: Secondary | ICD-10-CM | POA: Diagnosis present

## 2014-07-08 DIAGNOSIS — Z8719 Personal history of other diseases of the digestive system: Secondary | ICD-10-CM | POA: Insufficient documentation

## 2014-07-08 DIAGNOSIS — Z720 Tobacco use: Secondary | ICD-10-CM | POA: Diagnosis not present

## 2014-07-08 DIAGNOSIS — Z87440 Personal history of urinary (tract) infections: Secondary | ICD-10-CM | POA: Insufficient documentation

## 2014-07-08 DIAGNOSIS — S3991XA Unspecified injury of abdomen, initial encounter: Secondary | ICD-10-CM | POA: Diagnosis not present

## 2014-07-08 DIAGNOSIS — S00531A Contusion of lip, initial encounter: Secondary | ICD-10-CM | POA: Insufficient documentation

## 2014-07-08 DIAGNOSIS — Z79899 Other long term (current) drug therapy: Secondary | ICD-10-CM | POA: Diagnosis not present

## 2014-07-08 LAB — COMPREHENSIVE METABOLIC PANEL
ALT: 22 U/L (ref 0–35)
ANION GAP: 17 — AB (ref 5–15)
AST: 28 U/L (ref 0–37)
Albumin: 4.6 g/dL (ref 3.5–5.2)
Alkaline Phosphatase: 59 U/L (ref 39–117)
BILIRUBIN TOTAL: 0.4 mg/dL (ref 0.3–1.2)
BUN: 8 mg/dL (ref 6–23)
CHLORIDE: 101 meq/L (ref 96–112)
CO2: 20 mmol/L (ref 19–32)
Calcium: 9.8 mg/dL (ref 8.4–10.5)
Creatinine, Ser: 0.82 mg/dL (ref 0.50–1.10)
GFR calc Af Amer: >90 mL/min (ref >90)
GLUCOSE: 108 mg/dL — AB (ref 70–99)
Potassium: 3.3 mmol/L — ABNORMAL LOW (ref 3.5–5.1)
Sodium: 138 mmol/L (ref 135–145)
Total Protein: 8.3 g/dL (ref 6.0–8.3)

## 2014-07-08 LAB — I-STAT CHEM 8, ED
BUN: 10 mg/dL (ref 6–23)
CHLORIDE: 103 meq/L (ref 96–112)
Calcium, Ion: 1.17 mmol/L (ref 1.12–1.23)
Creatinine, Ser: 0.7 mg/dL (ref 0.50–1.10)
Glucose, Bld: 107 mg/dL — ABNORMAL HIGH (ref 70–99)
HCT: 49 % — ABNORMAL HIGH (ref 36.0–46.0)
Hemoglobin: 16.7 g/dL — ABNORMAL HIGH (ref 12.0–15.0)
Potassium: 3.3 mmol/L — ABNORMAL LOW (ref 3.5–5.1)
Sodium: 141 mmol/L (ref 135–145)
TCO2: 21 mmol/L (ref 0–100)

## 2014-07-08 LAB — CBC
HEMATOCRIT: 44.2 % (ref 36.0–46.0)
HEMOGLOBIN: 15.1 g/dL — AB (ref 12.0–15.0)
MCH: 30.3 pg (ref 26.0–34.0)
MCHC: 34.2 g/dL (ref 30.0–36.0)
MCV: 88.6 fL (ref 78.0–100.0)
Platelets: 341 10*3/uL (ref 150–400)
RBC: 4.99 MIL/uL (ref 3.87–5.11)
RDW: 13.2 % (ref 11.5–15.5)
WBC: 12.7 10*3/uL — ABNORMAL HIGH (ref 4.0–10.5)

## 2014-07-08 LAB — PROTIME-INR
INR: 1.01 (ref 0.00–1.49)
Prothrombin Time: 13.4 seconds (ref 11.6–15.2)

## 2014-07-08 LAB — SAMPLE TO BLOOD BANK

## 2014-07-08 LAB — I-STAT BETA HCG BLOOD, ED (MC, WL, AP ONLY)

## 2014-07-08 LAB — ETHANOL: Alcohol, Ethyl (B): <5 mg/dL (ref 0–9)

## 2014-07-08 MED ORDER — IOHEXOL 300 MG/ML  SOLN
100.0000 mL | Freq: Once | INTRAMUSCULAR | Status: AC | PRN
Start: 2014-07-08 — End: 2014-07-08
  Administered 2014-07-08: 100 mL via INTRAVENOUS

## 2014-07-08 MED ORDER — LORAZEPAM 2 MG/ML IJ SOLN
INTRAMUSCULAR | Status: AC
  Administered 2014-07-08: 1 mg via INTRAVENOUS
  Filled 2014-07-08: qty 1

## 2014-07-08 MED ORDER — ONDANSETRON HCL 4 MG/2ML IJ SOLN
4.0000 mg | Freq: Once | INTRAMUSCULAR | Status: AC
  Administered 2014-07-08: 4 mg via INTRAVENOUS

## 2014-07-08 MED ORDER — ONDANSETRON 4 MG PO TBDP: 4.0000 mg | ORAL_TABLET | Freq: Three times a day (TID) | ORAL | Status: DC | PRN

## 2014-07-08 MED ORDER — HYDROCODONE-ACETAMINOPHEN 5-325 MG PO TABS: 1.0000 | ORAL_TABLET | ORAL | Status: DC | PRN

## 2014-07-08 MED ORDER — HYDROMORPHONE HCL 1 MG/ML IJ SOLN
1.0000 mg | Freq: Once | INTRAMUSCULAR | Status: AC
  Administered 2014-07-08: 1 mg via INTRAVENOUS
  Filled 2014-07-08: qty 1

## 2014-07-08 MED ORDER — ONDANSETRON HCL 4 MG/2ML IJ SOLN
INTRAMUSCULAR | Status: AC
  Filled 2014-07-08: qty 2

## 2014-07-08 MED ORDER — IBUPROFEN 600 MG PO TABS: 600.0000 mg | ORAL_TABLET | Freq: Four times a day (QID) | ORAL | Status: DC | PRN

## 2014-07-08 MED ORDER — FENTANYL CITRATE 0.05 MG/ML IJ SOLN
INTRAMUSCULAR | Status: AC | PRN
  Administered 2014-07-08: 50 ug via INTRAVENOUS

## 2014-07-08 MED ORDER — CYCLOBENZAPRINE HCL 10 MG PO TABS: 10.0000 mg | ORAL_TABLET | Freq: Two times a day (BID) | ORAL | Status: DC | PRN

## 2014-07-08 MED ORDER — LORAZEPAM 2 MG/ML IJ SOLN
1.0000 mg | Freq: Once | INTRAMUSCULAR | Status: AC
  Administered 2014-07-08: 1 mg via INTRAVENOUS

## 2014-07-08 MED ORDER — SODIUM CHLORIDE 0.9 % IV SOLN
INTRAVENOUS | Status: AC | PRN
  Administered 2014-07-08: 1000 mL via INTRAVENOUS

## 2014-07-08 MED ORDER — FENTANYL CITRATE 0.05 MG/ML IJ SOLN
INTRAMUSCULAR | Status: AC
  Filled 2014-07-08: qty 2
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
## 2014-07-08 NOTE — ED Notes (Signed)
Pt states she is still hurting the same but feels calmer.

## 2014-07-08 NOTE — ED Notes (Signed)
Pt going to CT scan at this time.   

## 2014-07-08 NOTE — ED Notes (Signed)
Resident at bedside.  

## 2014-07-08 NOTE — Discharge Instructions (Signed)

## 2014-07-08 NOTE — ED Notes (Signed)
Portable x-ray at the bedside.  
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
## 2014-07-08 NOTE — ED Notes (Signed)
Family updated as to patient's status.

## 2014-07-09 LAB — CDS SEROLOGY

## 2014-08-24 ENCOUNTER — Encounter: Payer: BLUE CROSS/BLUE SHIELD | Admitting: Sports Medicine

## 2014-09-07 ENCOUNTER — Emergency Department (HOSPITAL_COMMUNITY)
Admission: EM | Admit: 2014-09-07 | Discharge: 2014-09-07 | Disposition: A | Discharge status: Home / Self Care | Payer: BLUE CROSS/BLUE SHIELD | Attending: Emergency Medicine | Admitting: Emergency Medicine

## 2014-09-07 ENCOUNTER — Encounter (HOSPITAL_COMMUNITY): Payer: Self-pay | Admitting: Emergency Medicine

## 2014-09-07 ENCOUNTER — Emergency Department (HOSPITAL_COMMUNITY): Payer: BLUE CROSS/BLUE SHIELD

## 2014-09-07 DIAGNOSIS — Z8719 Personal history of other diseases of the digestive system: Secondary | ICD-10-CM | POA: Diagnosis not present

## 2014-09-07 DIAGNOSIS — Z87440 Personal history of urinary (tract) infections: Secondary | ICD-10-CM | POA: Insufficient documentation

## 2014-09-07 DIAGNOSIS — F410 Panic disorder [episodic paroxysmal anxiety] without agoraphobia: Secondary | ICD-10-CM | POA: Diagnosis not present

## 2014-09-07 DIAGNOSIS — Z79899 Other long term (current) drug therapy: Secondary | ICD-10-CM | POA: Insufficient documentation

## 2014-09-07 DIAGNOSIS — R1110 Vomiting, unspecified: Secondary | ICD-10-CM | POA: Diagnosis not present

## 2014-09-07 DIAGNOSIS — Z79818 Long term (current) use of other agents affecting estrogen receptors and estrogen levels: Secondary | ICD-10-CM | POA: Insufficient documentation

## 2014-09-07 DIAGNOSIS — R079 Chest pain, unspecified: Secondary | ICD-10-CM | POA: Diagnosis present

## 2014-09-07 DIAGNOSIS — Z720 Tobacco use: Secondary | ICD-10-CM | POA: Insufficient documentation

## 2014-09-07 LAB — COMPREHENSIVE METABOLIC PANEL
ALT: 19 U/L (ref 0–35)
AST: 26 U/L (ref 0–37)
Albumin: 4.1 g/dL (ref 3.5–5.2)
Alkaline Phosphatase: 60 U/L (ref 39–117)
Anion gap: 8 (ref 5–15)
BUN: 7 mg/dL (ref 6–23)
CALCIUM: 9.4 mg/dL (ref 8.4–10.5)
CO2: 23 mmol/L (ref 19–32)
Chloride: 107 mmol/L (ref 96–112)
Creatinine, Ser: 0.82 mg/dL (ref 0.50–1.10)
GFR calc non Af Amer: >90 mL/min (ref >90)
Glucose, Bld: 129 mg/dL — ABNORMAL HIGH (ref 70–99)
POTASSIUM: 3.5 mmol/L (ref 3.5–5.1)
SODIUM: 138 mmol/L (ref 135–145)
TOTAL PROTEIN: 7.7 g/dL (ref 6.0–8.3)
Total Bilirubin: 0.4 mg/dL (ref 0.3–1.2)

## 2014-09-07 LAB — CBC WITH DIFFERENTIAL/PLATELET
Basophils Absolute: 0 10*3/uL (ref 0.0–0.1)
Basophils Relative: 0 % (ref 0–1)
EOS PCT: 1 % (ref 0–5)
Eosinophils Absolute: 0.1 10*3/uL (ref 0.0–0.7)
HEMATOCRIT: 42.4 % (ref 36.0–46.0)
HEMOGLOBIN: 14.5 g/dL (ref 12.0–15.0)
Lymphocytes Relative: 21 % (ref 12–46)
Lymphs Abs: 2.1 10*3/uL (ref 0.7–4.0)
MCH: 29.2 pg (ref 26.0–34.0)
MCHC: 34.2 g/dL (ref 30.0–36.0)
MCV: 85.3 fL (ref 78.0–100.0)
MONOS PCT: 5 % (ref 3–12)
Monocytes Absolute: 0.5 10*3/uL (ref 0.1–1.0)
NEUTROS ABS: 7.2 10*3/uL (ref 1.7–7.7)
Neutrophils Relative %: 73 % (ref 43–77)
Platelets: 328 10*3/uL (ref 150–400)
RBC: 4.97 MIL/uL (ref 3.87–5.11)
RDW: 14.2 % (ref 11.5–15.5)
WBC: 9.8 10*3/uL (ref 4.0–10.5)

## 2014-09-07 LAB — I-STAT TROPONIN, ED: Troponin i, poc: 0 ng/mL (ref 0.00–0.08)

## 2014-09-07 LAB — D-DIMER, QUANTITATIVE (NOT AT ARMC): D DIMER QUANT: 0.4 ug{FEU}/mL (ref 0.00–0.48)

## 2014-09-07 MED ORDER — KETOROLAC TROMETHAMINE 30 MG/ML IJ SOLN
30.0000 mg | Freq: Once | INTRAMUSCULAR | Status: AC
Start: 2014-09-07 — End: 2014-09-07
  Administered 2014-09-07: 30 mg via INTRAVENOUS
  Filled 2014-09-07: qty 1

## 2014-09-07 MED ORDER — IBUPROFEN 800 MG PO TABS: 800.0000 mg | ORAL_TABLET | Freq: Three times a day (TID) | ORAL | Status: DC | PRN

## 2014-09-07 MED ORDER — LORAZEPAM 1 MG PO TABS: 1.0000 mg | ORAL_TABLET | Freq: Three times a day (TID) | ORAL | Status: DC | PRN

## 2014-09-07 MED ORDER — ONDANSETRON HCL 4 MG/2ML IJ SOLN
4.0000 mg | Freq: Once | INTRAMUSCULAR | Status: AC
  Administered 2014-09-07: 4 mg via INTRAVENOUS
  Filled 2014-09-07: qty 2

## 2014-09-07 MED ORDER — LORAZEPAM 2 MG/ML IJ SOLN
1.0000 mg | Freq: Once | INTRAMUSCULAR | Status: AC
  Administered 2014-09-07: 1 mg via INTRAVENOUS
  Filled 2014-09-07: qty 1

## 2014-09-07 MED ORDER — SODIUM CHLORIDE 0.9 % IV BOLUS (SEPSIS)
1000.0000 mL | Freq: Once | INTRAVENOUS | Status: AC
  Administered 2014-09-07: 1000 mL via INTRAVENOUS
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
## 2014-09-07 NOTE — Discharge Instructions (Signed)
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
## 2014-11-19 ENCOUNTER — Emergency Department (HOSPITAL_COMMUNITY)
Admission: EM | Admit: 2014-11-19 | Discharge: 2014-11-19 | Disposition: A | Discharge status: Home / Self Care | Payer: BLUE CROSS/BLUE SHIELD | Attending: Emergency Medicine | Admitting: Emergency Medicine

## 2014-11-19 ENCOUNTER — Emergency Department (HOSPITAL_COMMUNITY): Payer: BLUE CROSS/BLUE SHIELD

## 2014-11-19 ENCOUNTER — Encounter (HOSPITAL_COMMUNITY): Payer: Self-pay | Admitting: Neurology

## 2014-11-19 ENCOUNTER — Encounter (HOSPITAL_COMMUNITY): Payer: Self-pay | Admitting: Emergency Medicine

## 2014-11-19 DIAGNOSIS — Z87440 Personal history of urinary (tract) infections: Secondary | ICD-10-CM | POA: Diagnosis not present

## 2014-11-19 DIAGNOSIS — R109 Unspecified abdominal pain: Secondary | ICD-10-CM

## 2014-11-19 DIAGNOSIS — R112 Nausea with vomiting, unspecified: Secondary | ICD-10-CM | POA: Diagnosis not present

## 2014-11-19 DIAGNOSIS — R1011 Right upper quadrant pain: Secondary | ICD-10-CM | POA: Diagnosis not present

## 2014-11-19 DIAGNOSIS — Z720 Tobacco use: Secondary | ICD-10-CM | POA: Insufficient documentation

## 2014-11-19 DIAGNOSIS — R1013 Epigastric pain: Secondary | ICD-10-CM

## 2014-11-19 DIAGNOSIS — Z8719 Personal history of other diseases of the digestive system: Secondary | ICD-10-CM | POA: Insufficient documentation

## 2014-11-19 DIAGNOSIS — Z3202 Encounter for pregnancy test, result negative: Secondary | ICD-10-CM | POA: Insufficient documentation

## 2014-11-19 LAB — CBC WITH DIFFERENTIAL/PLATELET
Basophils Absolute: 0 10*3/uL (ref 0.0–0.1)
Basophils Relative: 0 % (ref 0–1)
EOS ABS: 0.1 10*3/uL (ref 0.0–0.7)
EOS PCT: 0 % (ref 0–5)
HCT: 42.1 % (ref 36.0–46.0)
Hemoglobin: 14.5 g/dL (ref 12.0–15.0)
Lymphocytes Relative: 17 % (ref 12–46)
Lymphs Abs: 2.1 10*3/uL (ref 0.7–4.0)
MCH: 29.7 pg (ref 26.0–34.0)
MCHC: 34.4 g/dL (ref 30.0–36.0)
MCV: 86.1 fL (ref 78.0–100.0)
Monocytes Absolute: 0.7 10*3/uL (ref 0.1–1.0)
Monocytes Relative: 6 % (ref 3–12)
NEUTROS ABS: 9.6 10*3/uL — AB (ref 1.7–7.7)
NEUTROS PCT: 77 % (ref 43–77)
PLATELETS: 337 10*3/uL (ref 150–400)
RBC: 4.89 MIL/uL (ref 3.87–5.11)
RDW: 13.5 % (ref 11.5–15.5)
WBC: 12.5 10*3/uL — AB (ref 4.0–10.5)

## 2014-11-19 LAB — URINE MICROSCOPIC-ADD ON

## 2014-11-19 LAB — COMPREHENSIVE METABOLIC PANEL
ALBUMIN: 4.1 g/dL (ref 3.5–5.0)
ALT: 21 U/L (ref 14–54)
AST: 23 U/L (ref 15–41)
Alkaline Phosphatase: 57 U/L (ref 38–126)
Anion gap: 9 (ref 5–15)
BILIRUBIN TOTAL: 0.7 mg/dL (ref 0.3–1.2)
BUN: 7 mg/dL (ref 6–20)
CHLORIDE: 107 mmol/L (ref 101–111)
CO2: 21 mmol/L — AB (ref 22–32)
Calcium: 9.3 mg/dL (ref 8.9–10.3)
Creatinine, Ser: 0.69 mg/dL (ref 0.44–1.00)
GFR calc Af Amer: >60 mL/min (ref >60)
GFR calc non Af Amer: >60 mL/min (ref >60)
GLUCOSE: 123 mg/dL — AB (ref 65–99)
POTASSIUM: 3.6 mmol/L (ref 3.5–5.1)
Sodium: 137 mmol/L (ref 135–145)
TOTAL PROTEIN: 7.7 g/dL (ref 6.5–8.1)

## 2014-11-19 LAB — POC URINE PREG, ED: Preg Test, Ur: NEGATIVE

## 2014-11-19 LAB — URINALYSIS, ROUTINE W REFLEX MICROSCOPIC
Bilirubin Urine: NEGATIVE
Glucose, UA: NEGATIVE mg/dL
Hgb urine dipstick: NEGATIVE
KETONES UR: NEGATIVE mg/dL
NITRITE: NEGATIVE
Protein, ur: NEGATIVE mg/dL
Specific Gravity, Urine: 1.026 (ref 1.005–1.030)
Urobilinogen, UA: 0.2 mg/dL (ref 0.0–1.0)
pH: 6 (ref 5.0–8.0)

## 2014-11-19 LAB — LIPASE, BLOOD: Lipase: 22 U/L (ref 22–51)

## 2014-11-19 MED ORDER — ONDANSETRON HCL 4 MG/2ML IJ SOLN
4.0000 mg | Freq: Once | INTRAMUSCULAR | Status: AC
  Administered 2014-11-19: 4 mg via INTRAVENOUS
  Filled 2014-11-19: qty 2

## 2014-11-19 MED ORDER — ONDANSETRON 4 MG PO TBDP: 4.0000 mg | ORAL_TABLET | Freq: Three times a day (TID) | ORAL | Status: DC | PRN

## 2014-11-19 MED ORDER — HYDROMORPHONE HCL 1 MG/ML IJ SOLN
1.0000 mg | Freq: Once | INTRAMUSCULAR | Status: AC
  Administered 2014-11-19: 1 mg via INTRAVENOUS
  Filled 2014-11-19: qty 1

## 2014-11-19 MED ORDER — IOHEXOL 300 MG/ML  SOLN
100.0000 mL | Freq: Once | INTRAMUSCULAR | Status: AC | PRN
  Administered 2014-11-19: 100 mL via INTRAVENOUS

## 2014-11-19 MED ORDER — OXYCODONE-ACETAMINOPHEN 5-325 MG PO TABS: 1.0000 | ORAL_TABLET | ORAL | Status: DC | PRN

## 2014-11-19 MED ORDER — IOHEXOL 300 MG/ML  SOLN
25.0000 mL | Freq: Once | INTRAMUSCULAR | Status: AC | PRN
  Administered 2014-11-19: 25 mL via ORAL

## 2014-11-19 MED ORDER — SODIUM CHLORIDE 0.9 % IV BOLUS (SEPSIS)
1000.0000 mL | Freq: Once | INTRAVENOUS | Status: AC
  Administered 2014-11-19: 1000 mL via INTRAVENOUS

## 2014-11-19 MED ORDER — MORPHINE SULFATE 4 MG/ML IJ SOLN
4.0000 mg | Freq: Once | INTRAMUSCULAR | Status: AC
  Administered 2014-11-19: 4 mg via INTRAVENOUS
  Filled 2014-11-19: qty 1

## 2014-11-19 NOTE — ED Provider Notes (Signed)
CSN: [id number]     Arrival date & time 11/19/14  1003 History   First MD Initiated Contact with Patient 11/19/14 1024     Chief Complaint  Patient presents with  . Abdominal Pain  . Emesis     (Consider location/radiation/quality/duration/timing/severity/associated sxs/prior Treatment) Patient is a 24 y.o. female presenting with abdominal pain and vomiting. The history is provided by the patient and medical records.  Abdominal Pain Associated symptoms: nausea and vomiting   Associated symptoms: no diarrhea   Emesis Associated symptoms: abdominal pain   Associated symptoms: no diarrhea    This is a 24 year old female with past medical history significant for hemorrhoids and frequent UTIs, presenting to the ED for abdominal pain. Patient states the past 5 days she has had epigastric and right upper quadrant abdominal pain which she describes as "burning and pinching".  She states she has had persistent nausea and vomiting and has been unable to tolerate PO intake.  She states she thought that she was sick with a GI bug so has not been evaluated for this yet.  She denies sick contacts, fever, chills, sweats, or diarrhea.  She denies pelvic pain, vaginal discharge, or urinary symptoms.  No prior abdominal surgeries.  VSS.  Past Medical History  Diagnosis Date  . Hemorrhoids   . UTI (lower urinary tract infection)    History reviewed. No pertinent past surgical history. No family history on file. History  Substance Use Topics  . Smoking status: Current Every Day Smoker  . Smokeless tobacco: Never Used  . Alcohol Use: No   OB History    No data available     Review of Systems  Gastrointestinal: Positive for nausea, vomiting and abdominal pain. Negative for diarrhea.  All other systems reviewed and are negative.     Allergies  Shellfish allergy  Home Medications   Prior to Admission medications   Medication Sig Start Date End Date Taking? Authorizing Provider   etonogestrel (NEXPLANON) 68 MG IMPL implant 1 each by Subdermal route once.   Yes Historical Provider, MD  Ondansetron HCl (ZOFRAN PO) Take 2 tablets by mouth once as needed (nausea).   Yes Historical Provider, MD   BP 160/96 mmHg  Pulse 90  Temp(Src) 97.7 F (36.5 C) (Oral)  Resp 22  Ht  (1.6 m)  Wt 185 lb (83.915 kg)  BMI 32.78 kg/m2  SpO2 96%   Physical Exam  Constitutional: She is oriented to person, place, and time. She appears well-developed and well-nourished.  Appears uncomfortable, actively vomiting and crying  HENT:  Head: Normocephalic and atraumatic.  Mouth/Throat: Oropharynx is clear and moist.  Eyes: Conjunctivae and EOM are normal. Pupils are equal, round, and reactive to light.  Neck: Normal range of motion.  Cardiovascular: Normal rate, regular rhythm and normal heart sounds.   Pulmonary/Chest: Effort normal and breath sounds normal.  Abdominal: Soft. Bowel sounds are normal. There is tenderness in the epigastric area. There is guarding and positive Murphy's sign. There is no rebound and no CVA tenderness.  Abdomen soft, nondistended, focal tenderness in epigastrium and right upper quadrant with positive Murphy sign, voluntary guarding  Musculoskeletal: Normal range of motion.  Neurological: She is alert and oriented to person, place, and time.  Skin: Skin is warm and dry.  Psychiatric: She has a normal mood and affect.  Nursing note and vitals reviewed.   ED Course  Procedures (including critical care time) Labs Review Labs Reviewed  CBC WITH DIFFERENTIAL/PLATELET - Abnormal; Notable for  the following:    WBC 12.5 (*)    Neutro Abs 9.6 (*)    All other components within normal limits  COMPREHENSIVE METABOLIC PANEL - Abnormal; Notable for the following:    CO2 21 (*)    Glucose, Bld 123 (*)    All other components within normal limits  URINALYSIS, ROUTINE W REFLEX MICROSCOPIC (NOT AT [facility]) - Abnormal; Notable for the following:    APPearance CLOUDY  (*)    Leukocytes, UA SMALL (*)    All other components within normal limits  URINE MICROSCOPIC-ADD ON - Abnormal; Notable for the following:    Squamous Epithelial / LPF MANY (*)    Bacteria, UA FEW (*)    All other components within normal limits  LIPASE, BLOOD  POC URINE PREG, ED    Imaging Review [place] Abdomen Complete  11/19/2014   CLINICAL DATA:  Five day history of epigastric pain and vomiting 5 day history of epigastric pain and vomiting  EXAM: ULTRASOUND ABDOMEN COMPLETE  COMPARISON:  CT abdomen and pelvis July 08, 2014  FINDINGS: Gallbladder: No gallstones or wall thickening visualized. There is no pericholecystic fluid. Patient complains of diffuse abdominal tenderness ; unable to assess for potential Murphy sign.  Common bile duct: Diameter: 4 mm. There is no intrahepatic, common hepatic, or common bile duct dilatation.  Liver: No focal lesion identified. Within normal limits in parenchymal echogenicity.  IVC: No abnormality visualized.  Pancreas: Visualized portion unremarkable. Portions of pancreas obscured by gas.  Spleen: Size and appearance within normal limits.  Right Kidney: Length: 10.3 cm. Echogenicity within normal limits. No mass or hydronephrosis visualized.  Left Kidney: Length: 11.8 cm. Echogenicity within normal limits. No mass or hydronephrosis visualized.  Abdominal aorta: No aneurysm visualized.  Other findings: No demonstrable ascites.  IMPRESSION: Portions of pancreas obscured by gas. Visualized portions of pancreas appear normal. Study otherwise unremarkable. Patient is diffusely tender over the abdomen ; etiology uncertain.   Electronically Signed   By: Bretta BangWilliam  Woodruff III M.D.   On: 11/19/2014 11:46   Ct Abdomen Pelvis W Contrast  11/19/2014   CLINICAL DATA:  Right lower quadrant acute abdominal pain for 5 days. History of recurrent urinary tract infections, constipation and hemorrhoids.  EXAM: CT ABDOMEN AND PELVIS WITH CONTRAST  TECHNIQUE: Multidetector CT  imaging of the abdomen and pelvis was performed using the standard protocol following bolus administration of intravenous contrast.  CONTRAST:  100mL OMNIPAQUE IOHEXOL 300 MG/ML  SOLN  COMPARISON:  07/08/2004, 11/18/2004  FINDINGS: Lower chest: Clear lung bases. Normal heart size. No pericardial or pleural effusion. No hiatal hernia. No lower lobe pneumonia evident.  Abdomen: Liver, gallbladder, biliary system, pancreas, spleen, adrenal glands, and kidneys are within normal limits for age and demonstrate no acute process.  Negative for bowel obstruction, dilatation, ileus, or free air.  No abdominal free fluid, fluid collection, hemorrhage, abscess, or adenopathy.  Normal appendix in the right lower quadrant.  Normal aorta.  Negative for aneurysm or acute vascular process.  Pelvis: Enlarged cystic ovaries bilaterally. Right ovarian cyst measures 31 x 24 mm, image 73. Largest left ovarian cyst measures 30 x 24 mm, image 72.  Trace pelvic free fluid, likely physiologic. No pelvic hemorrhage, hematoma, abscess, adenopathy, inguinal abnormality, or hernia. Urinary bladder collapsed. Uterus normal in size and midline. No acute distal bowel process.  No acute osseous finding.  IMPRESSION: No acute intra-abdominal or pelvic finding by CT.  Normal appendix  Bilateral ovarian cysts, measurements as above.  Trace  pelvic free fluid likely physiologic   Electronically Signed   By: Judie Petit.  Shick M.D.   On: 11/19/2014 13:49     EKG Interpretation None      MDM   Final diagnoses:  Epigastric pain  RUQ pain  Abdominal pain  Nausea and vomiting, vomiting of unspecified type   24 year old female with right upper quadrant epigastric pain over the past 5 days. She complains of persistent nausea and vomiting. Patient afebrile, nontoxic. She does appear very uncomfortable and is sobbing loudly. She had 1 episode of active emesis while I was examining her. She had tenderness in her right upper quadrant epigastrium without  peritonitis. Concern for symptomatic gallstones versus cholecystitis. Will obtain lab work and ultrasound.  Lab work overall reassuring, mild leukocytosis without left shift.  CO2 slightly low at 21, suspect due to vomiting.  Attempted to obtain ultrasound, however inconclusive findings  CT scan was obtained-- revealing bilateral ovarian cysts.  Not entire sure this is the source of her pain as her pain is epigatric/RUQ and she denies pelvic pain, vaginal discharge, urinary symptoms.  Patient was given multiple rounds of pain meds in the ED with improvement.  She has not had any further nausea/vomiting here in the ED.  Will d/c home with supportive care.  Will give OB-GYN FU for ovarian cysts.  Discussed plan with patient, he/she acknowledged understanding and agreed with plan of care.  Return precautions given for new or worsening symptoms.  Garlon Hatchet, PA-C 11/19/14 1556  Pricilla Loveless, MD 11/21/14 [phone]

## 2014-11-19 NOTE — ED Notes (Signed)
Patient here with continued abdominal pain that she was seen for earlier today.  Patient is having nausea and vomiting.  Patient states she has been taking meds that she was given but not helping.

## 2014-11-19 NOTE — ED Notes (Signed)
Pt leaving for US.

## 2014-11-19 NOTE — Discharge Instructions (Signed)
There were some ovarian cysts seen on your CT, these are normal findings and are usually benign.  You will need to see an OB-GYN for close monitoring.  Information for women's clinic attached. Take the prescribed medication as directed.  Make sure to drink plenty of fluids-- recommend to start with bland diet and progress back to normal as tolerated. Return to the ED for new or worsening symptoms.

## 2014-11-19 NOTE — ED Notes (Signed)
NAD at this time. Pt is stable and leaving with friend. 

## 2014-11-19 NOTE — ED Notes (Signed)
Pt reports RUQ abdominal pain "burning" for 5 days with vomiting. Can't keep anything down. Pt is very tearful and emotional in triage.

## 2014-11-19 NOTE — ED Notes (Signed)
PT left for CT 

## 2014-11-19 NOTE — ED Notes (Signed)
PT IS IN US, MEDICATED WHILE THERE.

## 2014-11-20 ENCOUNTER — Emergency Department (HOSPITAL_COMMUNITY)
Admission: EM | Admit: 2014-11-20 | Discharge: 2014-11-20 | Disposition: A | Discharge status: Home / Self Care | Payer: BLUE CROSS/BLUE SHIELD | Attending: Emergency Medicine | Admitting: Emergency Medicine

## 2014-11-20 DIAGNOSIS — R1013 Epigastric pain: Secondary | ICD-10-CM

## 2014-11-20 LAB — CBC WITH DIFFERENTIAL/PLATELET
Basophils Absolute: 0 10*3/uL (ref 0.0–0.1)
Basophils Relative: 0 % (ref 0–1)
EOS PCT: 0 % (ref 0–5)
Eosinophils Absolute: 0 10*3/uL (ref 0.0–0.7)
HCT: 39.6 % (ref 36.0–46.0)
Hemoglobin: 13.8 g/dL (ref 12.0–15.0)
LYMPHS ABS: 2.2 10*3/uL (ref 0.7–4.0)
Lymphocytes Relative: 13 % (ref 12–46)
MCH: 30.1 pg (ref 26.0–34.0)
MCHC: 34.8 g/dL (ref 30.0–36.0)
MCV: 86.3 fL (ref 78.0–100.0)
MONOS PCT: 5 % (ref 3–12)
Monocytes Absolute: 0.9 10*3/uL (ref 0.1–1.0)
NEUTROS ABS: 14.3 10*3/uL — AB (ref 1.7–7.7)
Neutrophils Relative %: 82 % — ABNORMAL HIGH (ref 43–77)
PLATELETS: 316 10*3/uL (ref 150–400)
RBC: 4.59 MIL/uL (ref 3.87–5.11)
RDW: 13.6 % (ref 11.5–15.5)
WBC: 17.5 10*3/uL — ABNORMAL HIGH (ref 4.0–10.5)

## 2014-11-20 LAB — COMPREHENSIVE METABOLIC PANEL
ALK PHOS: 59 U/L (ref 38–126)
ALT: 20 U/L (ref 14–54)
ANION GAP: 13 (ref 5–15)
AST: 23 U/L (ref 15–41)
Albumin: 4.1 g/dL (ref 3.5–5.0)
BUN: 7 mg/dL (ref 6–20)
CALCIUM: 9.1 mg/dL (ref 8.9–10.3)
CO2: 19 mmol/L — ABNORMAL LOW (ref 22–32)
Chloride: 104 mmol/L (ref 101–111)
Creatinine, Ser: 0.69 mg/dL (ref 0.44–1.00)
GFR calc Af Amer: >60 mL/min (ref >60)
GFR calc non Af Amer: >60 mL/min (ref >60)
Glucose, Bld: 109 mg/dL — ABNORMAL HIGH (ref 65–99)
POTASSIUM: 3.3 mmol/L — AB (ref 3.5–5.1)
Sodium: 136 mmol/L (ref 135–145)
Total Bilirubin: 0.5 mg/dL (ref 0.3–1.2)
Total Protein: 8 g/dL (ref 6.5–8.1)

## 2014-11-20 LAB — LIPASE, BLOOD: LIPASE: 21 U/L — AB (ref 22–51)

## 2014-11-20 LAB — I-STAT CG4 LACTIC ACID, ED: Lactic Acid, Venous: 1.04 mmol/L (ref 0.5–2.0)

## 2014-11-20 MED ORDER — SODIUM CHLORIDE 0.9 % IV BOLUS (SEPSIS)
1000.0000 mL | Freq: Once | INTRAVENOUS | Status: AC
  Administered 2014-11-20: 1000 mL via INTRAVENOUS

## 2014-11-20 MED ORDER — METOCLOPRAMIDE HCL 5 MG/ML IJ SOLN
10.0000 mg | Freq: Once | INTRAMUSCULAR | Status: AC
  Administered 2014-11-20: 10 mg via INTRAVENOUS
  Filled 2014-11-20: qty 2

## 2014-11-20 MED ORDER — FAMOTIDINE IN NACL 20-0.9 MG/50ML-% IV SOLN
20.0000 mg | Freq: Once | INTRAVENOUS | Status: AC
  Administered 2014-11-20: 20 mg via INTRAVENOUS
  Filled 2014-11-20: qty 50

## 2014-11-20 MED ORDER — MORPHINE SULFATE 4 MG/ML IJ SOLN
4.0000 mg | Freq: Once | INTRAMUSCULAR | Status: AC
  Administered 2014-11-20: 4 mg via INTRAVENOUS
  Filled 2014-11-20: qty 1

## 2014-11-20 MED ORDER — ALUM & MAG HYDROXIDE-SIMETH 200-200-20 MG/5ML PO SUSP
30.0000 mL | Freq: Once | ORAL | Status: AC
  Administered 2014-11-20: 30 mL via ORAL
  Filled 2014-11-20: qty 30

## 2014-11-20 NOTE — ED Notes (Signed)
EMT went in to room to do a final set of vital on pt.  She informed this RN that the room was empty, no patient to be found.  This RN looked up and down the hall and in the bathroom for the pt.   There are several drops of blood on the floor, but no sign of the IV catheter in the trash or biohazard bin.  Primary RN made aware.  Charge RN made aware.

## 2014-11-20 NOTE — Discharge Instructions (Signed)
Abdominal Pain, Women Abigail Gray, you need to make an appointment with [facility] to see a primary care doctor.  You do not need to have a job or insurance to see them.  Take medications previously prescribed to you for your symptoms.  Come back to the ED immediately for any worsening. Thank you. Abdominal (stomach, pelvic, or belly) pain can be caused by many things. It is important to tell your doctor:  The location of the pain.  Does it come and go or is it present all the time?  Are there things that start the pain (eating certain foods, exercise)?  Are there other symptoms associated with the pain (fever, nausea, vomiting, diarrhea)? All of this is helpful to know when trying to find the cause of the pain. CAUSES   Stomach: virus or bacteria infection, or ulcer.  Intestine: appendicitis (inflamed appendix), regional ileitis (Crohn's disease), ulcerative colitis (inflamed colon), irritable bowel syndrome, diverticulitis (inflamed diverticulum of the colon), or cancer of the stomach or intestine.  Gallbladder disease or stones in the gallbladder.  Kidney disease, kidney stones, or infection.  Pancreas infection or cancer.  Fibromyalgia (pain disorder).  Diseases of the female organs:  Uterus: fibroid (non-cancerous) tumors or infection.  Fallopian tubes: infection or tubal pregnancy.  Ovary: cysts or tumors.  Pelvic adhesions (scar tissue).  Endometriosis (uterus lining tissue growing in the pelvis and on the pelvic organs).  Pelvic congestion syndrome (female organs filling up with blood just before the menstrual period).  Pain with the menstrual period.  Pain with ovulation (producing an egg).  Pain with an IUD (intrauterine device, birth control) in the uterus.  Cancer of the female organs.  Functional pain (pain not caused by a disease, may improve without treatment).  Psychological pain.  Depression. DIAGNOSIS  Your doctor will decide the  seriousness of your pain by doing an examination.  Blood tests.  X-rays.  Ultrasound.  CT scan (computed tomography, special type of X-ray).  MRI (magnetic resonance imaging).  Cultures, for infection.  Barium enema (dye inserted in the large intestine, to better view it with X-rays).  Colonoscopy (looking in intestine with a lighted tube).  Laparoscopy (minor surgery, looking in abdomen with a lighted tube).  Major abdominal exploratory surgery (looking in abdomen with a large incision). TREATMENT  The treatment will depend on the cause of the pain.   Many cases can be observed and treated at home.  Over-the-counter medicines recommended by your caregiver.  Prescription medicine.  Antibiotics, for infection.  Birth control pills, for painful periods or for ovulation pain.  Hormone treatment, for endometriosis.  Nerve blocking injections.  Physical therapy.  Antidepressants.  Counseling with a psychologist or psychiatrist.  Minor or major surgery. HOME CARE INSTRUCTIONS   Do not take laxatives, unless directed by your caregiver.  Take over-the-counter pain medicine only if ordered by your caregiver. Do not take aspirin because it can cause an upset stomach or bleeding.  Try a clear liquid diet (broth or water) as ordered by your caregiver. Slowly move to a bland diet, as tolerated, if the pain is related to the stomach or intestine.  Have a thermometer and take your temperature several times a day, and record it.  Bed rest and sleep, if it helps the pain.  Avoid sexual intercourse, if it causes pain.  Avoid stressful situations.  Keep your follow-up appointments and tests, as your caregiver orders.  If the pain does not go away with medicine or surgery,  you may try:  Acupuncture.  Relaxation exercises (yoga, meditation).  Group therapy.  Counseling. SEEK MEDICAL CARE IF:   You notice certain foods cause stomach pain.  Your home care  treatment is not helping your pain.  You need stronger pain medicine.  You want your IUD removed.  You feel faint or lightheaded.  You develop nausea and vomiting.  You develop a rash.  You are having side effects or an allergy to your medicine. SEEK IMMEDIATE MEDICAL CARE IF:   Your pain does not go away or gets worse.  You have a fever.  Your pain is felt only in portions of the abdomen. The right side could possibly be appendicitis. The left lower portion of the abdomen could be colitis or diverticulitis.  You are passing blood in your stools (bright red or black tarry stools, with or without vomiting).  You have blood in your urine.  You develop chills, with or without a fever.  You pass out. MAKE SURE YOU:   Understand these instructions.  Will watch your condition.  Will get help right away if you are not doing well or get worse. Document Released: 04/09/2007 Document Revised: 10/27/2013 Document Reviewed: 04/29/2009 [facility] Patient Information 2015 [employer] [place]. This information is not intended to replace advice given to you by your health care provider. Make sure you discuss any questions you have with your health care provider.

## 2014-11-20 NOTE — ED Notes (Signed)
Saline locked 20G IV catheter located in biohazard bin by this RN, Manning CharityKatlynne (RN), and Elliot GurneyWoody (RN). Pt has not returned for discharge packet.

## 2014-11-20 NOTE — ED Provider Notes (Signed)
CSN: [id number]     Arrival date & time 11/19/14  2227 History  This chart was scribed for Tomasita Crumble, MD by Freida Busman, ED Scribe. This patient was seen in room B17C/B17C and the patient's care was started 12:34 AM.     Chief Complaint  Patient presents with  . Abdominal Pain    The history is provided by the patient. No language interpreter was used.    HPI Comments:  Abigail Gray is a 24 y.o. female who presents to the Emergency Department complaining of throbbing, burning, epigastric abdominal pain that started 5 days ago. Pt states she was seen in the ED yesterday (5/26) AM for the same pain  and was  diagnosed with cysts. She states she was dischared with  percocet and zofran which she has taken without relief. She reports associated constipation, nausea and vomiting. Pt denies fever   Past Medical History  Diagnosis Date  . Hemorrhoids   . UTI (lower urinary tract infection)    History reviewed. No pertinent past surgical history. No family history on file. History  Substance Use Topics  . Smoking status: Current Every Day Smoker  . Smokeless tobacco: Never Used  . Alcohol Use: No   OB History    No data available     Review of Systems 10 Systems reviewed and all are negative for acute change except as noted in the HPI.     Allergies  Shellfish allergy  Home Medications   Prior to Admission medications   Medication Sig Start Date End Date Taking? Authorizing Provider  etonogestrel (NEXPLANON) 68 MG IMPL implant 1 each by Subdermal route once.    Historical Provider, MD  ondansetron (ZOFRAN ODT) 4 MG disintegrating tablet Take 1 tablet (4 mg total) by mouth every 8 (eight) hours as needed for nausea. 11/19/14   Garlon Hatchet, PA-C  Ondansetron HCl (ZOFRAN PO) Take 2 tablets by mouth once as needed (nausea).    Historical Provider, MD  oxyCODONE-acetaminophen (PERCOCET/ROXICET) 5-325 MG per tablet Take 1 tablet by mouth every 4 (four) hours as needed.  11/19/14   Garlon Hatchet, PA-C   BP 129/76 mmHg  Pulse 101  Temp(Src) 98 F (36.7 C) (Oral)  Resp 24  SpO2 100% Physical Exam  Constitutional: She is oriented to person, place, and time. She appears well-developed and well-nourished. No distress.  Appears uncomfortable Actively crying  HENT:  Head: Normocephalic and atraumatic.  Nose: Nose normal.  Mouth/Throat: Oropharynx is clear and moist. No oropharyngeal exudate.  Eyes: Conjunctivae and EOM are normal. Pupils are equal, round, and reactive to light. No scleral icterus.  Neck: Normal range of motion. Neck supple. No JVD present. No tracheal deviation present. No thyromegaly present.  Cardiovascular: Normal rate, regular rhythm and normal heart sounds.  Exam reveals no gallop and no friction rub.   No murmur heard. Pulmonary/Chest: Effort normal and breath sounds normal. No respiratory distress. She has no wheezes. She exhibits no tenderness.  Abdominal: Soft. Bowel sounds are normal. She exhibits no distension and no mass. There is no tenderness. There is no rebound and no guarding.  Musculoskeletal: Normal range of motion. She exhibits no edema or tenderness.  Lymphadenopathy:    She has no cervical adenopathy.  Neurological: She is alert and oriented to person, place, and time. No cranial nerve deficit. She exhibits normal muscle tone.  Skin: Skin is warm and dry. No rash noted. No erythema. No pallor.  Nursing note and vitals reviewed.  ED Course  Procedures   DIAGNOSTIC STUDIES:  Oxygen Saturation is 100% on RA, normal by my interpretation.    COORDINATION OF CARE:  12:39 AM Updated pt with results. Will order pain meds. Discussed treatment plan with pt at bedside and pt agreed to plan.  Labs Review Labs Reviewed  CBC WITH DIFFERENTIAL/PLATELET - Abnormal; Notable for the following:    WBC 17.5 (*)    Neutrophils Relative % 82 (*)    Neutro Abs 14.3 (*)    All other components within normal limits   COMPREHENSIVE METABOLIC PANEL - Abnormal; Notable for the following:    Potassium 3.3 (*)    CO2 19 (*)    Glucose, Bld 109 (*)    All other components within normal limits  LIPASE, BLOOD - Abnormal; Notable for the following:    Lipase 21 (*)    All other components within normal limits  I-STAT CG4 LACTIC ACID, ED    Imaging Review US Abdomen Complete  11/19/2014   CLINICAL DATA:  Five day history of epigastric pain and vomiting 5 day history of epigastric pain and vomiting  EXAM: ULTRASOUND ABDOMEN COMPLETE  COMPARISON:  CT abdomen and pelvis July 08, 2014  FINDINGS: Gallbladder: No gallstones or wall thickening visualized. There is no pericholecystic fluid. Patient complains of diffuse abdominal tenderness ; unable to assess for potential Murphy sign.  Common bile duct: Diameter: 4 mm. There is no intrahepatic, common hepatic, or common bile duct dilatation.  Liver: No focal lesion identified. Within normal limits in parenchymal echogenicity.  IVC: No abnormality visualized.  Pancreas: Visualized portion unremarkable. Portions of pancreas obscured by gas.  Spleen: Size and appearance within normal limits.  Right Kidney: Length: 10.3 cm. Echogenicity within normal limits. No mass or hydronephrosis visualized.  Left Kidney: Length: 11.8 cm. Echogenicity within normal limits. No mass or hydronephrosis visualized.  Abdominal aorta: No aneurysm visualized.  Other findings: No demonstrable ascites.  IMPRESSION: Portions of pancreas obscured by gas. Visualized portions of pancreas appear normal. Study otherwise unremarkable. Patient is diffusely tender over the abdomen ; etiology uncertain.   Electronically Signed   By: Bretta Bang III M.D.   On: 11/19/2014 11:46   Ct Abdomen Pelvis W Contrast  11/19/2014   CLINICAL DATA:  Right lower quadrant acute abdominal pain for 5 days. History of recurrent urinary tract infections, constipation and hemorrhoids.  EXAM: CT ABDOMEN AND PELVIS WITH CONTRAST   TECHNIQUE: Multidetector CT imaging of the abdomen and pelvis was performed using the standard protocol following bolus administration of intravenous contrast.  CONTRAST:  OMNIPAQUE IOHEXOL 300 MG/ML  SOLN  COMPARISON:  07/08/2004, 11/18/2004  FINDINGS: Lower chest: Clear lung bases. Normal heart size. No pericardial or pleural effusion. No hiatal hernia. No lower lobe pneumonia evident.  Abdomen: Liver, gallbladder, biliary system, pancreas, spleen, adrenal glands, and kidneys are within normal limits for age and demonstrate no acute process.  Negative for bowel obstruction, dilatation, ileus, or free air.  No abdominal free fluid, fluid collection, hemorrhage, abscess, or adenopathy.  Normal appendix in the right lower quadrant.  Normal aorta.  Negative for aneurysm or acute vascular process.  Pelvis: Enlarged cystic ovaries bilaterally. Right ovarian cyst measures 31 x 24 mm, image 73. Largest left ovarian cyst measures 30 x 24 mm, image 72.  Trace pelvic free fluid, likely physiologic. No pelvic hemorrhage, hematoma, abscess, adenopathy, inguinal abnormality, or hernia. Urinary bladder collapsed. Uterus normal in size and midline. No acute distal bowel process.  No  acute osseous finding.  IMPRESSION: No acute intra-abdominal or pelvic finding by CT.  Normal appendix  Bilateral ovarian cysts, measurements as above.  Trace pelvic free fluid likely physiologic   Electronically Signed   By: Judie PetitM.  Shick M.D.   On: 11/19/2014 13:49     EKG Interpretation None      MDM   Final diagnoses:  None  Patient presents to the ED for abdominal pain.  She was seen her previously with neg work up including US and CT scan.  Her pain is midepigastric however she has no tenderness on exam.  She is easily distractable and the pain is not reproducible.  I have low suspicion for an acute intrabdominal pathology.  She was given famotidine, reglan, maalox for pain control.   She has no episodes of emesis in teh ED  despite saying she has been throwing up all day.  Her white count is elevated to 17, however this is nonspecific.  Abdominal labs are at baseline.  I do no believe she has a source of infection.  She was given PCP fu.  She is worried about not getting an appointment due to insurance, but I reassured her that they will take her.  She was given IVF which resolved her tachycardia.  Her VS remain within her normal limits and  She is safe for DC.    I personally performed the services described in this documentation, which was scribed in my presence. The recorded information has been reviewed and is accurate.   Tomasita CrumbleAdeleke Tamorah Hada, MD 11/20/14 [phone]

## 2014-11-20 NOTE — ED Notes (Signed)
Pt called at phone number on file to request proof that pt has removed IV.  This RN also encouraged pt to come back to ED to receive discharge packet.

## 2015-03-17 ENCOUNTER — Emergency Department (HOSPITAL_COMMUNITY)
Admission: EM | Admit: 2015-03-17 | Discharge: 2015-03-17 | Disposition: A | Discharge status: Home / Self Care | Payer: BLUE CROSS/BLUE SHIELD | Attending: Emergency Medicine | Admitting: Emergency Medicine

## 2015-03-17 ENCOUNTER — Encounter (HOSPITAL_COMMUNITY): Payer: Self-pay | Admitting: Family Medicine

## 2015-03-17 ENCOUNTER — Emergency Department (HOSPITAL_COMMUNITY): Payer: BLUE CROSS/BLUE SHIELD

## 2015-03-17 DIAGNOSIS — S6991XA Unspecified injury of right wrist, hand and finger(s), initial encounter: Secondary | ICD-10-CM | POA: Diagnosis present

## 2015-03-17 DIAGNOSIS — Z720 Tobacco use: Secondary | ICD-10-CM | POA: Insufficient documentation

## 2015-03-17 DIAGNOSIS — S6992XA Unspecified injury of left wrist, hand and finger(s), initial encounter: Secondary | ICD-10-CM | POA: Insufficient documentation

## 2015-03-17 DIAGNOSIS — Z87440 Personal history of urinary (tract) infections: Secondary | ICD-10-CM | POA: Insufficient documentation

## 2015-03-17 DIAGNOSIS — Y9389 Activity, other specified: Secondary | ICD-10-CM | POA: Insufficient documentation

## 2015-03-17 DIAGNOSIS — S6990XA Unspecified injury of unspecified wrist, hand and finger(s), initial encounter: Secondary | ICD-10-CM

## 2015-03-17 DIAGNOSIS — Y9289 Other specified places as the place of occurrence of the external cause: Secondary | ICD-10-CM | POA: Insufficient documentation

## 2015-03-17 DIAGNOSIS — S60511A Abrasion of right hand, initial encounter: Secondary | ICD-10-CM | POA: Insufficient documentation

## 2015-03-17 DIAGNOSIS — Z8719 Personal history of other diseases of the digestive system: Secondary | ICD-10-CM | POA: Insufficient documentation

## 2015-03-17 DIAGNOSIS — Z23 Encounter for immunization: Secondary | ICD-10-CM | POA: Diagnosis not present

## 2015-03-17 DIAGNOSIS — Y998 Other external cause status: Secondary | ICD-10-CM | POA: Insufficient documentation

## 2015-03-17 MED ORDER — TETANUS-DIPHTH-ACELL PERTUSSIS 5-2.5-18.5 LF-MCG/0.5 IM SUSP
0.5000 mL | Freq: Once | INTRAMUSCULAR | Status: AC
  Administered 2015-03-17: 0.5 mL via INTRAMUSCULAR
  Filled 2015-03-17: qty 0.5

## 2015-03-17 MED ORDER — OXYCODONE-ACETAMINOPHEN 5-325 MG PO TABS
ORAL_TABLET | ORAL | Status: AC
  Filled 2015-03-17: qty 1

## 2015-03-17 MED ORDER — NAPROXEN 500 MG PO TABS: 500.0000 mg | ORAL_TABLET | Freq: Two times a day (BID) | ORAL | Status: DC

## 2015-03-17 MED ORDER — OXYCODONE-ACETAMINOPHEN 5-325 MG PO TABS: 2.0000 | ORAL_TABLET | ORAL | Status: DC | PRN

## 2015-03-17 MED ORDER — OXYCODONE-ACETAMINOPHEN 5-325 MG PO TABS
1.0000 | ORAL_TABLET | Freq: Once | ORAL | Status: AC
  Administered 2015-03-17: 1 via ORAL
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
## 2015-03-17 NOTE — ED Notes (Signed)
Ring removed from left ring finger using Surgilube and ice applied to both hands
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
## 2015-04-25 ENCOUNTER — Emergency Department (INDEPENDENT_AMBULATORY_CARE_PROVIDER_SITE_OTHER)
Admission: EM | Admit: 2015-04-25 | Discharge: 2015-04-25 | Disposition: A | Discharge status: Home / Self Care | Payer: BLUE CROSS/BLUE SHIELD | Source: Home / Self Care | Attending: Emergency Medicine | Admitting: Emergency Medicine

## 2015-04-25 ENCOUNTER — Encounter (HOSPITAL_COMMUNITY): Payer: Self-pay | Admitting: *Deleted

## 2015-04-25 DIAGNOSIS — S61309A Unspecified open wound of unspecified finger with damage to nail, initial encounter: Secondary | ICD-10-CM | POA: Diagnosis not present

## 2015-04-25 DIAGNOSIS — Z23 Encounter for immunization: Secondary | ICD-10-CM

## 2015-04-25 MED ORDER — TRAMADOL HCL 50 MG PO TABS: 50.0000 mg | ORAL_TABLET | Freq: Four times a day (QID) | ORAL | Status: DC | PRN

## 2015-04-25 MED ORDER — LIDOCAINE HCL (PF) 2 % IJ SOLN
INTRAMUSCULAR | Status: AC
  Filled 2015-04-25: qty 2

## 2015-04-25 MED ORDER — IBUPROFEN 600 MG PO TABS: 600.0000 mg | ORAL_TABLET | Freq: Four times a day (QID) | ORAL | Status: DC | PRN

## 2015-04-25 MED ORDER — TETANUS-DIPHTH-ACELL PERTUSSIS 5-2.5-18.5 LF-MCG/0.5 IM SUSP
INTRAMUSCULAR | Status: AC
  Filled 2015-04-25: qty 0.5

## 2015-04-25 MED ORDER — HYDROCODONE-ACETAMINOPHEN 5-325 MG PO TABS
ORAL_TABLET | ORAL | Status: AC
  Filled 2015-04-25: qty 1

## 2015-04-25 MED ORDER — TETANUS-DIPHTH-ACELL PERTUSSIS 5-2.5-18.5 LF-MCG/0.5 IM SUSP
0.5000 mL | Freq: Once | INTRAMUSCULAR | Status: AC
  Administered 2015-04-25: 0.5 mL via INTRAMUSCULAR

## 2015-04-25 MED ORDER — HYDROCODONE-ACETAMINOPHEN 5-325 MG PO TABS
1.0000 | ORAL_TABLET | Freq: Once | ORAL | Status: AC
  Administered 2015-04-25: 1 via ORAL

## 2015-04-25 NOTE — ED Provider Notes (Signed)
CSN: [id number]     Arrival date & time 04/25/15  1533 History   First MD Initiated Contact with Patient 04/25/15 1557     Chief Complaint  Patient presents with  . Finger Injury   (Consider location/radiation/quality/duration/timing/severity/associated sxs/prior Treatment) HPI She is a 24 year old woman here for evaluation of left ring finger injury. She states she was at work at [employer] when her nail got caught in machine and the nail got bent backwards. She reports heavy bleeding initially. Injury happened 2-3 hours ago, but they would not let her leave work to be evaluated. She does not know when her last tetanus shot was. She reports throbbing pain at the nail.  Past Medical History  Diagnosis Date  . Hemorrhoids   . UTI (lower urinary tract infection)    History reviewed. No pertinent past surgical history. No family history on file. Social History  Substance Use Topics  . Smoking status: Current Every Day Smoker  . Smokeless tobacco: Never Used  . Alcohol Use: No   OB History    No data available     Review of Systems As in history of present illness Allergies  Shellfish allergy  Home Medications   Prior to Admission medications   Medication Sig Start Date End Date Taking? Authorizing Provider  etonogestrel (NEXPLANON) 68 MG IMPL implant 1 each by Subdermal route once.   Yes Historical Provider, MD  ibuprofen (ADVIL,MOTRIN) 600 MG tablet Take 1 tablet (600 mg total) by mouth every 6 (six) hours as needed. 04/25/15   Charm Rings, MD  traMADol (ULTRAM) 50 MG tablet Take 1 tablet (50 mg total) by mouth every 6 (six) hours as needed. 04/25/15   Charm Rings, MD   Meds Ordered and Administered this Visit   Medications  HYDROcodone-acetaminophen (NORCO/VICODIN) 5-325 MG per tablet 1 tablet (not administered)  Tdap (BOOSTRIX) injection 0.5 mL (not administered)    BP 119/77 mmHg  Pulse 92  Temp(Src) 97.9 F (36.6 C) (Oral)  SpO2 100% No data found.   Physical  Exam  Constitutional: She is oriented to person, place, and time. She appears well-developed and well-nourished.  Cardiovascular: Normal rate.   Pulmonary/Chest: Effort normal.  Neurological: She is alert and oriented to person, place, and time.  Skin:  Left ring finger nail is avulsed.  Nail held in place by cuticle attachment only. Nail bed is intact. She does have a fake nails present.    ED Course  .Nail Removal Date/Time: 04/25/2015 4:47 PM Performed by: Charm Rings Authorized by: Charm Rings Consent: Verbal consent obtained. Risks and benefits: risks, benefits and alternatives were discussed Consent given by: patient Patient understanding: patient states understanding of the procedure being performed Patient identity confirmed: verbally with patient Time out: Immediately prior to procedure a "time out" was called to verify the correct patient, procedure, equipment, support staff and site/side marked as required. Location: right hand Location details: right ring finger Anesthesia: digital block Local anesthetic: lidocaine 2% without epinephrine Anesthetic total: 1.5 ml Preparation: skin prepped with Betadine Amount removed: complete Dressing: antibiotic ointment and 4x4 Patient tolerance: Patient tolerated the procedure well with no immediate complications Comments: Nail was completely avulsed during injury. I did have to detach the nail from the cuticle.   (including critical care time)  Labs Review Labs Reviewed - No data to display  Imaging Review No results found.   MDM   1. Nail avulsion, finger, initial encounter    Wound care discussed. Tetanus updated.  Ibuprofen and tramadol for pain. Follow-up as needed.    Charm RingsErin J Corde Antonini, MD 04/25/15 [phone]

## 2015-04-25 NOTE — ED Notes (Signed)
While working at [employer], pt injured right 4th finger on machine - her finger "went in, but the nail (with artificial nail) bent completely up".  Pt states injury occurred @ approx 2 hrs PTA "but they made me stay at work and just put on a glove".  Pt tearful.  States attempts made to reach supervisor, Morrie Sheldonshley, were unsuccessful.  Nail avulsion noted, with small portion intact.  Dried blood noted; states had been bleeding since injury.

## 2015-04-25 NOTE — Discharge Instructions (Signed)
Her fingernail was pulled off today. We removed the rest of the nail today. Leave the bandage on for 24 hours. After that, wash it twice a day with soap and water. Ice will help with the pain. You can take ibuprofen 600 mg every 6 hours as needed for pain. Use the tramadol every 6-8 hours as needed for severe pain. Your nail should grow back over the next several weeks. Follow-up as needed.

## 2015-05-02 ENCOUNTER — Encounter (HOSPITAL_COMMUNITY): Payer: Self-pay | Admitting: *Deleted

## 2015-05-02 ENCOUNTER — Emergency Department (HOSPITAL_COMMUNITY)
Admission: EM | Admit: 2015-05-02 | Discharge: 2015-05-02 | Disposition: A | Discharge status: Home / Self Care | Payer: BLUE CROSS/BLUE SHIELD | Attending: Emergency Medicine | Admitting: Emergency Medicine

## 2015-05-02 DIAGNOSIS — F1310 Sedative, hypnotic or anxiolytic abuse, uncomplicated: Secondary | ICD-10-CM | POA: Diagnosis not present

## 2015-05-02 DIAGNOSIS — Z720 Tobacco use: Secondary | ICD-10-CM | POA: Diagnosis not present

## 2015-05-02 DIAGNOSIS — F1110 Opioid abuse, uncomplicated: Secondary | ICD-10-CM | POA: Diagnosis not present

## 2015-05-02 DIAGNOSIS — R1010 Upper abdominal pain, unspecified: Secondary | ICD-10-CM | POA: Insufficient documentation

## 2015-05-02 DIAGNOSIS — Z3202 Encounter for pregnancy test, result negative: Secondary | ICD-10-CM | POA: Insufficient documentation

## 2015-05-02 DIAGNOSIS — R112 Nausea with vomiting, unspecified: Secondary | ICD-10-CM | POA: Diagnosis not present

## 2015-05-02 DIAGNOSIS — Z8719 Personal history of other diseases of the digestive system: Secondary | ICD-10-CM | POA: Diagnosis not present

## 2015-05-02 DIAGNOSIS — F1210 Cannabis abuse, uncomplicated: Secondary | ICD-10-CM | POA: Diagnosis not present

## 2015-05-02 DIAGNOSIS — Z87440 Personal history of urinary (tract) infections: Secondary | ICD-10-CM | POA: Diagnosis not present

## 2015-05-02 DIAGNOSIS — F419 Anxiety disorder, unspecified: Secondary | ICD-10-CM | POA: Insufficient documentation

## 2015-05-02 DIAGNOSIS — R197 Diarrhea, unspecified: Secondary | ICD-10-CM | POA: Diagnosis not present

## 2015-05-02 DIAGNOSIS — R1013 Epigastric pain: Secondary | ICD-10-CM | POA: Diagnosis not present

## 2015-05-02 LAB — COMPREHENSIVE METABOLIC PANEL
ALBUMIN: 4.3 g/dL (ref 3.5–5.0)
ALT: 21 U/L (ref 14–54)
ANION GAP: 9 (ref 5–15)
AST: 23 U/L (ref 15–41)
Alkaline Phosphatase: 63 U/L (ref 38–126)
BUN: <5 mg/dL — ABNORMAL LOW (ref 6–20)
CO2: 23 mmol/L (ref 22–32)
Calcium: 9.5 mg/dL (ref 8.9–10.3)
Chloride: 105 mmol/L (ref 101–111)
Creatinine, Ser: 0.73 mg/dL (ref 0.44–1.00)
GFR calc Af Amer: >60 mL/min (ref >60)
GFR calc non Af Amer: >60 mL/min (ref >60)
GLUCOSE: 104 mg/dL — AB (ref 65–99)
POTASSIUM: 3.7 mmol/L (ref 3.5–5.1)
Sodium: 137 mmol/L (ref 135–145)
Total Bilirubin: 0.4 mg/dL (ref 0.3–1.2)
Total Protein: 8.2 g/dL — ABNORMAL HIGH (ref 6.5–8.1)

## 2015-05-02 LAB — RAPID URINE DRUG SCREEN, HOSP PERFORMED
Amphetamines: NOT DETECTED
BARBITURATES: NOT DETECTED
BENZODIAZEPINES: POSITIVE — AB
Cocaine: NOT DETECTED
Opiates: POSITIVE — AB
TETRAHYDROCANNABINOL: POSITIVE — AB

## 2015-05-02 LAB — URINALYSIS, ROUTINE W REFLEX MICROSCOPIC
Bilirubin Urine: NEGATIVE
GLUCOSE, UA: NEGATIVE mg/dL
Ketones, ur: NEGATIVE mg/dL
Leukocytes, UA: NEGATIVE
Nitrite: NEGATIVE
PROTEIN: NEGATIVE mg/dL
Specific Gravity, Urine: 1.007 (ref 1.005–1.030)
Urobilinogen, UA: 0.2 mg/dL (ref 0.0–1.0)
pH: 6 (ref 5.0–8.0)

## 2015-05-02 LAB — CBC
HCT: 43.9 % (ref 36.0–46.0)
Hemoglobin: 15 g/dL (ref 12.0–15.0)
MCH: 30.4 pg (ref 26.0–34.0)
MCHC: 34.2 g/dL (ref 30.0–36.0)
MCV: 88.9 fL (ref 78.0–100.0)
Platelets: 331 10*3/uL (ref 150–400)
RBC: 4.94 MIL/uL (ref 3.87–5.11)
RDW: 13 % (ref 11.5–15.5)
WBC: 10.7 10*3/uL — ABNORMAL HIGH (ref 4.0–10.5)

## 2015-05-02 LAB — URINE MICROSCOPIC-ADD ON

## 2015-05-02 LAB — LIPASE, BLOOD: Lipase: 22 U/L (ref 11–51)

## 2015-05-02 LAB — PREGNANCY, URINE: Preg Test, Ur: NEGATIVE

## 2015-05-02 MED ORDER — FAMOTIDINE 20 MG PO TABS
20.0000 mg | ORAL_TABLET | Freq: Once | ORAL | Status: AC
  Administered 2015-05-02: 20 mg via ORAL
  Filled 2015-05-02: qty 1

## 2015-05-02 MED ORDER — HYDROMORPHONE HCL 1 MG/ML IJ SOLN
0.5000 mg | Freq: Once | INTRAMUSCULAR | Status: AC
  Administered 2015-05-02: 0.5 mg via INTRAVENOUS
  Filled 2015-05-02: qty 1

## 2015-05-02 MED ORDER — ONDANSETRON 4 MG PO TBDP
4.0000 mg | ORAL_TABLET | Freq: Once | ORAL | Status: AC | PRN
  Administered 2015-05-02: 4 mg via ORAL

## 2015-05-02 MED ORDER — ONDANSETRON 8 MG PO TBDP: 8.0000 mg | ORAL_TABLET | Freq: Three times a day (TID) | ORAL | Status: DC | PRN

## 2015-05-02 MED ORDER — ONDANSETRON 4 MG PO TBDP
ORAL_TABLET | ORAL | Status: AC
  Filled 2015-05-02: qty 1

## 2015-05-02 MED ORDER — LORAZEPAM 2 MG/ML IJ SOLN
1.0000 mg | Freq: Once | INTRAMUSCULAR | Status: AC
  Administered 2015-05-02: 1 mg via INTRAVENOUS
  Filled 2015-05-02: qty 1

## 2015-05-02 MED ORDER — ONDANSETRON HCL 4 MG/2ML IJ SOLN
4.0000 mg | Freq: Once | INTRAMUSCULAR | Status: AC
  Administered 2015-05-02: 4 mg via INTRAVENOUS
  Filled 2015-05-02: qty 2

## 2015-05-02 MED ORDER — HYDROCODONE-ACETAMINOPHEN 5-325 MG PO TABS: 1.0000 | ORAL_TABLET | Freq: Four times a day (QID) | ORAL | Status: DC | PRN

## 2015-05-02 MED ORDER — SODIUM CHLORIDE 0.9 % IV BOLUS (SEPSIS)
1000.0000 mL | Freq: Once | INTRAVENOUS | Status: AC
  Administered 2015-05-02: 1000 mL via INTRAVENOUS

## 2015-05-02 MED ORDER — PANTOPRAZOLE SODIUM 40 MG IV SOLR
40.0000 mg | Freq: Once | INTRAVENOUS | Status: AC
  Administered 2015-05-02: 40 mg via INTRAVENOUS
  Filled 2015-05-02: qty 40

## 2015-05-02 MED ORDER — GI COCKTAIL ~~LOC~~
30.0000 mL | Freq: Once | ORAL | Status: AC
  Administered 2015-05-02: 30 mL via ORAL
  Filled 2015-05-02: qty 30

## 2015-05-02 NOTE — Discharge Instructions (Signed)
It was our pleasure to provide your ER care today - we hope that you feel better.  Rest. Drink plenty of fluids.  You may take zofran as need for nausea.  You may try take pepcid and maalox as need for symptom relief.   You may take hydrocodone as need for pain. No driving when taking hydrocodone. Also, do not take tylenol or acetaminophen containing medication when taking hydrocodone.  Avoid marijuana use - recurrent marijuana use can be associated with a recurrent vomiting and upper abdominal pain syndrome.   Follow up with gi doctor in the next couple weeks - see referral - call office to arrange appointment.  Return to ER if worse, new symptoms, fevers, persistent vomiting, medical emergency, other concern.  You were given pain medication in the ER - no driving for the next 4 hours.      Abdominal Pain, Adult Many things can cause abdominal pain. Usually, abdominal pain is not caused by a disease and will improve without treatment. It can often be observed and treated at home. Your health care provider will do a physical exam and possibly order blood tests and X-rays to help determine the seriousness of your pain. However, in many cases, more time must pass before a clear cause of the pain can be found. Before that point, your health care provider may not know if you need more testing or further treatment. HOME CARE INSTRUCTIONS Monitor your abdominal pain for any changes. The following actions may help to alleviate any discomfort you are experiencing:  Only take over-the-counter or prescription medicines as directed by your health care provider.  Do not take laxatives unless directed to do so by your health care provider.  Try a clear liquid diet (broth, tea, or water) as directed by your health care provider. Slowly move to a bland diet as tolerated. SEEK MEDICAL CARE IF:  You have unexplained abdominal pain.  You have abdominal pain associated with nausea or  diarrhea.  You have pain when you urinate or have a bowel movement.  You experience abdominal pain that wakes you in the night.  You have abdominal pain that is worsened or improved by eating food.  You have abdominal pain that is worsened with eating fatty foods.  You have a fever. SEEK IMMEDIATE MEDICAL CARE IF:  Your pain does not go away within 2 hours.  You keep throwing up (vomiting).  Your pain is felt only in portions of the abdomen, such as the right side or the left lower portion of the abdomen.  You pass bloody or black tarry stools. MAKE SURE YOU:  Understand these instructions.  Will watch your condition.  Will get help right away if you are not doing well or get worse.   This information is not intended to replace advice given to you by your health care provider. Make sure you discuss any questions you have with your health care provider.   Document Released: 03/22/2005 Document Revised: 03/03/2015 Document Reviewed: 02/19/2013 Elsevier Interactive Patient Education 2016 [employer]    Nausea and Vomiting Nausea is a sick feeling that often comes before throwing up (vomiting). Vomiting is a reflex where stomach contents come out of your mouth. Vomiting can cause severe loss of body fluids (dehydration). Children and elderly adults can become dehydrated quickly, especially if they also have diarrhea. Nausea and vomiting are symptoms of a condition or disease. It is important to find the cause of your symptoms. CAUSES   Direct irritation of  the stomach lining. This irritation can result from increased acid production (gastroesophageal reflux disease), infection, food poisoning, taking certain medicines (such as nonsteroidal anti-inflammatory drugs), alcohol use, or tobacco use.  Signals from the brain.These signals could be caused by a headache, heat exposure, an inner ear disturbance, increased pressure in the brain from injury, infection, a tumor, or a  concussion, pain, emotional stimulus, or metabolic problems.  An obstruction in the gastrointestinal tract (bowel obstruction).  Illnesses such as diabetes, hepatitis, gallbladder problems, appendicitis, kidney problems, cancer, sepsis, atypical symptoms of a heart attack, or eating disorders.  Medical treatments such as chemotherapy and radiation.  Receiving medicine that makes you sleep (general anesthetic) during surgery. DIAGNOSIS Your caregiver may ask for tests to be done if the problems do not improve after a few days. Tests may also be done if symptoms are severe or if the reason for the nausea and vomiting is not clear. Tests may include:  Urine tests.  Blood tests.  Stool tests.  Cultures (to look for evidence of infection).  X-rays or other imaging studies. Test results can help your caregiver make decisions about treatment or the need for additional tests. TREATMENT You need to stay well hydrated. Drink frequently but in small amounts.You may wish to drink water, sports drinks, clear broth, or eat frozen ice pops or gelatin dessert to help stay hydrated.When you eat, eating slowly may help prevent nausea.There are also some antinausea medicines that may help prevent nausea. HOME CARE INSTRUCTIONS   Take all medicine as directed by your caregiver.  If you do not have an appetite, do not force yourself to eat. However, you must continue to drink fluids.  If you have an appetite, eat a normal diet unless your caregiver tells you differently.  Eat a variety of complex carbohydrates (rice, wheat, potatoes, bread), lean meats, yogurt, fruits, and vegetables.  Avoid high-fat foods because they are more difficult to digest.  Drink enough water and fluids to keep your urine clear or pale yellow.  If you are dehydrated, ask your caregiver for specific rehydration instructions. Signs of dehydration may include:  Severe thirst.  Dry lips and mouth.  Dizziness.  Dark  urine.  Decreasing urine frequency and amount.  Confusion.  Rapid breathing or pulse. SEEK IMMEDIATE MEDICAL CARE IF:   You have blood or Hansell flecks (like coffee grounds) in your vomit.  You have black or bloody stools.  You have a severe headache or stiff neck.  You are confused.  You have severe abdominal pain.  You have chest pain or trouble breathing.  You do not urinate at least once every 8 hours.  You develop cold or clammy skin.  You continue to vomit for longer than 24 to 48 hours.  You have a fever. MAKE SURE YOU:   Understand these instructions.  Will watch your condition.  Will get help right away if you are not doing well or get worse.   This information is not intended to replace advice given to you by your health care provider. Make sure you discuss any questions you have with your health care provider.   Document Released: 06/12/2005 Document Revised: 09/04/2011 Document Reviewed: 11/09/2010 Elsevier Interactive Patient Education [employer]

## 2015-05-02 NOTE — ED Notes (Signed)
MD Steinl at the bedside  

## 2015-05-02 NOTE — ED Notes (Signed)
Pt aware of urine sample needed, pt unable to go at this time. 

## 2015-05-02 NOTE — ED Provider Notes (Signed)
CSN: [id number]     Arrival date & time 05/02/15  1656 History   First MD Initiated Contact with Patient 05/02/15 1736     Chief Complaint  Patient presents with  . Abdominal Pain  . Hematemesis     (Consider location/radiation/quality/duration/timing/severity/associated sxs/prior Treatment) Patient is a 24 y.o. female presenting with abdominal pain. The history is provided by the patient.  Abdominal Pain Associated symptoms: diarrhea, nausea and vomiting   Associated symptoms: no chest pain, no chills, no cough, no dysuria, no fever, no hematuria, no shortness of breath, no sore throat, no vaginal bleeding and no vaginal discharge   Patient c/o upper abd/epigastric pain for the past day. Pain constant, dull, moderate-severe, non radiating. No specific exacerbating or allev factors. Notes same pain in past, unsure of specific cause or dx. No hx gallstones, pancreatitis, or pud. No back or flank pain. No dysuria or hematuria. No lower abd pain or vaginal discharge or bleeding. States had implanted birth control, 1 yr ago, only occ spotting. Denies prior abd surgery. No chest pain or sob. No cough or uri c/o. No fever or chills. No hx IBD or other specific gi dx. +nvd. Couple episodes of each. Emesis not bloody or bilious. Diarrhea loose to watery.      Past Medical History  Diagnosis Date  . Hemorrhoids   . UTI (lower urinary tract infection)    History reviewed. No pertinent past surgical history. No family history on file. Social History  Substance Use Topics  . Smoking status: Current Every Day Smoker -- 0.50 packs/day    Types: Cigarettes  . Smokeless tobacco: Never Used  . Alcohol Use: No   OB History    No data available     Review of Systems  Constitutional: Negative for fever and chills.  HENT: Negative for sore throat.   Eyes: Negative for redness.  Respiratory: Negative for cough and shortness of breath.   Cardiovascular: Negative for chest pain.   Gastrointestinal: Positive for nausea, vomiting, abdominal pain and diarrhea.  Endocrine: Negative for polyuria.  Genitourinary: Negative for dysuria, hematuria, flank pain, vaginal bleeding and vaginal discharge.  Musculoskeletal: Negative for back pain and neck pain.  Skin: Negative for rash.  Neurological: Negative for headaches.  Hematological: Does not bruise/bleed easily.  Psychiatric/Behavioral: The patient is nervous/anxious.       Allergies  Shellfish allergy  Home Medications   Prior to Admission medications   Medication Sig Start Date End Date Taking? Authorizing Provider  etonogestrel (NEXPLANON) 68 MG IMPL implant 1 each by Subdermal route once.    Historical Provider, MD  ibuprofen (ADVIL,MOTRIN) 600 MG tablet Take 1 tablet (600 mg total) by mouth every 6 (six) hours as needed. 04/25/15   Charm Rings, MD  traMADol (ULTRAM) 50 MG tablet Take 1 tablet (50 mg total) by mouth every 6 (six) hours as needed. 04/25/15   Charm Rings, MD   BP 122/92 mmHg  Pulse 112  Temp(Src) 98.2 F (36.8 C) (Oral)  Resp 20  Ht  (1.6 m)  Wt 180 lb (81.647 kg)  BMI 31.89 kg/m2  SpO2 100% Physical Exam  Constitutional: She appears well-developed and well-nourished. No distress.  HENT:  Mouth/Throat: Oropharynx is clear and moist.  Eyes: Conjunctivae are normal. No scleral icterus.  Neck: Neck supple. No tracheal deviation present.  Cardiovascular: Normal rate, regular rhythm, normal heart sounds and intact distal pulses.   No murmur heard. Pulmonary/Chest: Effort normal and breath sounds normal. No respiratory  distress.  Abdominal: Soft. Normal appearance and bowel sounds are normal. She exhibits no distension and no mass. There is tenderness. There is no rebound and no guarding.  Epigastric tenderness, no rebound or guarding. No incarc hernia.   Genitourinary:  No cva tenderness  Musculoskeletal: She exhibits no edema.  Neurological: She is alert.  Skin: Skin is warm and  dry. No rash noted. She is not diaphoretic.  Psychiatric:  Anxious, tearful.   Nursing note and vitals reviewed.   ED Course  Procedures (including critical care time) Labs Review   Results for orders placed or performed during the hospital encounter of 05/02/15  Lipase, blood  Result Value Ref Range   Lipase 22 11 - 51 U/L  Comprehensive metabolic panel  Result Value Ref Range   Sodium 137 135 - 145 mmol/L   Potassium 3.7 3.5 - 5.1 mmol/L   Chloride 105 101 - 111 mmol/L   CO2 23 22 - 32 mmol/L   Glucose, Bld 104 (H) 65 - 99 mg/dL   BUN <5 (L) 6 - 20 mg/dL   Creatinine, Ser 1.610.73 0.44 - 1.00 mg/dL   Calcium 9.5 8.9 - 09.610.3 mg/dL   Total Protein 8.2 (H) 6.5 - 8.1 g/dL   Albumin 4.3 3.5 - 5.0 g/dL   AST 23 15 - 41 U/L   ALT 21 14 - 54 U/L   Alkaline Phosphatase 63 38 - 126 U/L   Total Bilirubin 0.4 0.3 - 1.2 mg/dL   GFR calc non Af Amer >60 >60 mL/min   GFR calc Af Amer >60 >60 mL/min   Anion gap 9 5 - 15  CBC  Result Value Ref Range   WBC 10.7 (H) 4.0 - 10.5 K/uL   RBC 4.94 3.87 - 5.11 MIL/uL   Hemoglobin 15.0 12.0 - 15.0 g/dL   HCT 04.543.9 40.936.0 - 81.146.0 %   MCV 88.9 78.0 - 100.0 fL   MCH 30.4 26.0 - 34.0 pg   MCHC 34.2 30.0 - 36.0 g/dL   RDW 91.413.0 78.211.5 - 95.615.5 %   Platelets 331 150 - 400 K/uL  Urinalysis, Routine w reflex microscopic (not at [facility])  Result Value Ref Range   Color, Urine YELLOW YELLOW   APPearance CLOUDY (A) CLEAR   Specific Gravity, Urine 1.007 1.005 - 1.030   pH 6.0 5.0 - 8.0   Glucose, UA NEGATIVE NEGATIVE mg/dL   Hgb urine dipstick LARGE (A) NEGATIVE   Bilirubin Urine NEGATIVE NEGATIVE   Ketones, ur NEGATIVE NEGATIVE mg/dL   Protein, ur NEGATIVE NEGATIVE mg/dL   Urobilinogen, UA 0.2 0.0 - 1.0 mg/dL   Nitrite NEGATIVE NEGATIVE   Leukocytes, UA NEGATIVE NEGATIVE  Pregnancy, urine  Result Value Ref Range   Preg Test, Ur NEGATIVE NEGATIVE  Urine rapid drug screen (hosp performed)  Result Value Ref Range   Opiates POSITIVE (A) NONE DETECTED    Cocaine NONE DETECTED NONE DETECTED   Benzodiazepines POSITIVE (A) NONE DETECTED   Amphetamines NONE DETECTED NONE DETECTED   Tetrahydrocannabinol POSITIVE (A) NONE DETECTED   Barbiturates NONE DETECTED NONE DETECTED  Urine microscopic-add on  Result Value Ref Range   Squamous Epithelial / LPF FEW (A) RARE   WBC, UA 0-2 <3 WBC/hpf   RBC / HPF 0-2 <3 RBC/hpf   Bacteria, UA FEW (A) RARE     I have personally reviewed and evaluated these images and lab results as part of my medical decision-making.   EKG Interpretation   Date/Time:  Sunday May 02 2015 17:03:07 EST Ventricular Rate:  110 PR Interval:  134 QRS Duration: 74 QT Interval:  316 QTC Calculation: 427 R Axis:   31 Text Interpretation:  Sinus tachycardia Nonspecific ST abnormality No  significant change since last tracing Confirmed by Denton Lank  MD, Caryn Bee  ([id number]) on 05/02/2015 5:39:44 PM      MDM   Iv ns bolus. Labs.  Pt appears very anxious. Will try protonix, zofran, and ativan for symptom improvement.   Reviewed nursing notes and prior charts for additional history.   Pt requests additional meds/pain med, states has ride, does not have to drive.  Dilaudid .5 mg iv. Gi cocktail and pepcid. Po fluids.  Recheck, pt alert, appears comfortable, no longer anxious, tolerating po fluids without nv. abd soft nt.  Discussed labs w pt.  Pt requests referral to gi - will provide.  uds +thc - advised to avoid thc, and possibility thc related recurrent vomiting/pain syndrome.  Pt currently appears stable for d/c.        Cathren Laine, MD 05/02/15 2024

## 2015-05-02 NOTE — ED Notes (Signed)
Pt. Left with all belongings and refused wheelchair. Discharge instructions were reviewed and all questions were answered.  

## 2015-05-02 NOTE — ED Notes (Signed)
Pt c/o RUQ pain onset last night, with n/v/d, pt reports x 5-6 dark red emesis, pt reports x 5-10 formed stools, pt reports L chest tightness, pt tearful, pt A&O x4, follows commands, speaks in complete sentences

## 2015-05-03 ENCOUNTER — Emergency Department (HOSPITAL_COMMUNITY)
Admission: EM | Admit: 2015-05-03 | Discharge: 2015-05-03 | Disposition: A | Discharge status: Home / Self Care | Payer: BLUE CROSS/BLUE SHIELD | Attending: Emergency Medicine | Admitting: Emergency Medicine

## 2015-05-03 ENCOUNTER — Encounter (HOSPITAL_COMMUNITY): Payer: Self-pay | Admitting: *Deleted

## 2015-05-03 ENCOUNTER — Emergency Department (HOSPITAL_COMMUNITY): Payer: BLUE CROSS/BLUE SHIELD

## 2015-05-03 DIAGNOSIS — Z87440 Personal history of urinary (tract) infections: Secondary | ICD-10-CM | POA: Diagnosis not present

## 2015-05-03 DIAGNOSIS — R51 Headache: Secondary | ICD-10-CM | POA: Diagnosis not present

## 2015-05-03 DIAGNOSIS — F319 Bipolar disorder, unspecified: Secondary | ICD-10-CM | POA: Insufficient documentation

## 2015-05-03 DIAGNOSIS — K920 Hematemesis: Secondary | ICD-10-CM | POA: Diagnosis not present

## 2015-05-03 DIAGNOSIS — Z720 Tobacco use: Secondary | ICD-10-CM | POA: Diagnosis not present

## 2015-05-03 DIAGNOSIS — Z79899 Other long term (current) drug therapy: Secondary | ICD-10-CM | POA: Insufficient documentation

## 2015-05-03 DIAGNOSIS — R197 Diarrhea, unspecified: Secondary | ICD-10-CM | POA: Insufficient documentation

## 2015-05-03 DIAGNOSIS — R1013 Epigastric pain: Secondary | ICD-10-CM

## 2015-05-03 MED ORDER — GI COCKTAIL ~~LOC~~
30.0000 mL | Freq: Once | ORAL | Status: AC
  Administered 2015-05-03: 30 mL via ORAL
  Filled 2015-05-03: qty 30

## 2015-05-03 MED ORDER — ONDANSETRON HCL 4 MG/2ML IJ SOLN
4.0000 mg | Freq: Once | INTRAMUSCULAR | Status: AC
  Administered 2015-05-03: 4 mg via INTRAVENOUS
  Filled 2015-05-03: qty 2

## 2015-05-03 MED ORDER — LORAZEPAM 1 MG PO TABS
1.0000 mg | ORAL_TABLET | Freq: Once | ORAL | Status: AC
  Administered 2015-05-03: 1 mg via ORAL
  Filled 2015-05-03: qty 1

## 2015-05-03 MED ORDER — PROMETHAZINE HCL 25 MG/ML IJ SOLN
25.0000 mg | Freq: Once | INTRAMUSCULAR | Status: AC
  Administered 2015-05-03: 25 mg via INTRAVENOUS
  Filled 2015-05-03: qty 1

## 2015-05-03 MED ORDER — PANTOPRAZOLE SODIUM 40 MG IV SOLR
40.0000 mg | Freq: Once | INTRAVENOUS | Status: AC
  Administered 2015-05-03: 40 mg via INTRAVENOUS
  Filled 2015-05-03: qty 40

## 2015-05-03 MED ORDER — PROMETHAZINE HCL 25 MG PO TABS: 25.0000 mg | ORAL_TABLET | Freq: Four times a day (QID) | ORAL | Status: DC | PRN

## 2015-05-03 MED ORDER — SODIUM CHLORIDE 0.9 % IV BOLUS (SEPSIS)
1000.0000 mL | Freq: Once | INTRAVENOUS | Status: AC
  Administered 2015-05-03: 1000 mL via INTRAVENOUS

## 2015-05-03 MED ORDER — LORAZEPAM 2 MG/ML IJ SOLN
1.0000 mg | Freq: Once | INTRAMUSCULAR | Status: AC
  Administered 2015-05-03: 1 mg via INTRAVENOUS
  Filled 2015-05-03: qty 1

## 2015-05-03 NOTE — ED Notes (Signed)
Pt had emesis in bag upon entering room. Pt given zofran before giving oral meds. Pt threw up multiple times after PO meds.

## 2015-05-03 NOTE — Discharge Instructions (Signed)
1. Medications: Phenergan every 6 hours as needed for nausea/ abdominal pain, usual home medications 2. Treatment: rest, drink plenty of fluids, avoid greasy foods and eat plenty of yogurt.  3. Follow Up: Please follow up with your primary doctor as soon as possible for discussion of your diagnoses and further evaluation after today's visit; Please use the attached resource guide provided to find one; Please return to the ER for any new or worsening symptoms.    Emergency Department Resource Guide 1) Find a Doctor and Pay Out of Pocket Although you won't have to find out who is covered by your insurance plan, it is a good idea to ask around and get recommendations. You will then need to call the office and see if the doctor you have chosen will accept you as a new patient and what types of options they offer for patients who are self-pay. Some doctors offer discounts or will set up payment plans for their patients who do not have insurance, but you will need to ask so you aren't surprised when you get to your appointment.  2) Contact Your Local Health Department Not all health departments have doctors that can see patients for sick visits, but many do, so it is worth a call to see if yours does. If you don't know where your local health department is, you can check in your phone book. The CDC also has a tool to help you locate your state's health department, and many state websites also have listings of all of their local health departments.  3) Find a Walk-in Clinic If your illness is not likely to be very severe or complicated, you may want to try a walk in clinic. These are popping up all over the country in pharmacies, drugstores, and shopping centers. They're usually staffed by nurse practitioners or physician assistants that have been trained to treat common illnesses and complaints. They're usually fairly quick and inexpensive. However, if you have serious medical issues or chronic medical  problems, these are probably not your best option.  No Primary Care Doctor: - Call Health Connect at  [phone] - they can help you locate a primary care doctor that  accepts your insurance, provides certain services, etc. - Physician Referral Service- [phone]  Chronic Pain Problems: Organization         Address  Phone   Notes  [facility]  [phone] Patients need to be referred by their primary care doctor.   Medication Assistance: Organization         Address  Phone   Notes  [facility] Medication [facility] [address] [place] [place], [place] [address] [phone] --Must be a resident of [facility] -- Must have NO insurance coverage whatsoever (no Medicaid/ Medicare, etc.) -- The pt. MUST have a primary care doctor that directs their care regularly and follows them in the community   [facility]  [phone]   [employer]  [phone]    Agencies that provide inexpensive medical care: Organization         Address  Phone   Notes  [facility]  [phone]   [facility]    [phone]   [facility] [address] [place], [place] [address] [phone]   [facility] [address] [place] [place] [phone]   Planned Parenthood    [phone]   [facility]    [phone]   [place] [address], [place] Phone:  [phone], Fax:  [phone] Hours of Operation:  9 am - 6 pm, M-F.  Also accepts Medicaid/Medicare and self-pay.  [facility] for [place], [address], [place] Phone: [phone], Fax: [phone]. Hours of Operation:  8:30 am - 5:30 pm, M-F.  Also accepts Medicaid and self-pay.  [facility] [address] [place] Phone: [phone]   [place], [place], [place] [phone], Ext. [phone]  Mondays & Thursdays: 7-9 AM.  First 15 patients are seen on a first come, first serve basis.    [place] Providers:  Organization         Address  Phone   Notes  [facility] [address], [place] [phone] Also accepts self-pay patients.  [facility] [address], [place]  [phone]   [place], [address], [place] [phone]   [facility] [address] [place] [phone]   Lucianne Lei [address], [place]   [phone] Only accepts [place] Access [place] patients after they have their name applied to their card.   Self-Pay (no insurance) in [facility]:  Organization         Address  Phone   Notes  Sickle Cell Patients, [facility] [place] [phone]   [facility] [place] [phone]   [facility] [place]  [place], [place], [place] [phone]   [facility]/Dr. Osei-Bonsu  [address] [place] or [address], [place] [phone] Phone number for both [place] and [place] locations is the same.  Urgent Medical and [facility] [address] [place] [phone]   [facility] [address] [place] or [address] [phone]   [facility] [address] [place] [phone], phone; [phone], fax Sees patients 1st and 3rd Saturday of every month.  Must not qualify for public or private insurance (i.e. Medicaid, Medicare, [place] Health Choice, Veterans' Benefits)  Household income should be no more than 200% of the poverty level The clinic cannot treat you if you are pregnant or think you are pregnant  Sexually transmitted diseases are not treated at  the clinic.    Dental Care: Organization         Address  Phone  Notes  [facility] [place] [phone] Accepts children up to age 55 who are enrolled in [place] or [place]; pregnant women with a Medicaid card; and children who have applied for Medicaid or [place] Health Choice, but were declined, whose parents can pay a reduced fee at time of service.  [facility] Department of [facility]  [address], [place] [phone] Accepts children up to age 74 who are enrolled in [place] or [place]; pregnant women with a Medicaid card; and children who have applied for Medicaid or [place] Health Choice, but were declined, whose parents can pay a reduced fee at time of service.  [place]  Access PROGRAM  [place] [phone] Patients are seen by appointment only. Walk-ins are not accepted. [place] will see patients 18 years of age and older. Monday - Tuesday (8am-5pm) Most Wednesdays (8:30-5pm) $30 per visit, cash only  [facility] Adult Dental Access PROGRAM  [address], [facility] [phone] Patients are seen by appointment only. Walk-ins are not accepted. [place] will see patients 66 years of age and older. One Wednesday Evening (Monthly: Volunteer Based).  $30 per visit, cash only  [place]  [phone] for adults; Children under age 79, call Graduate Pediatric Dentistry at [phone]. Children aged 46-14, please call [phone] to request a pediatric application.  Dental services are provided in all areas of dental care including fillings, crowns and bridges, complete and partial dentures, implants, gum treatment, root canals, and extractions. Preventive care is also provided. Treatment is provided to both adults and children. Patients are selected via a lottery and there is often a  waiting list.   [facility] [address] [place]  [phone] [email or web address]   [facility] [address] [place], [place] [phone], Ext. [phone] Second and Fourth Thursday of each month, opens at 6:30 AM; Clinic ends at 9 AM.  Patients are seen on a first-come first-served basis, and a limited number are seen during each clinic.   [facility]  [address] Hillard Danker [place], [place] [phone]   Eligibility Requirements You must have lived in [place], [place], or [place] counties for at least the last three months.   You cannot be eligible for state or federal sponsored [employer], including [employer], [place], or [employer].   You generally cannot be eligible for healthcare insurance through your employer.    How to apply: Eligibility screenings are held every Tuesday and Wednesday afternoon from 1:00 pm until 4:00 pm. You do not need an appointment for the interview!  [facility] [address] [place], [place]   [place]  [place] Department  [place]  [phone]    Behavioral Health Resources in the Community: Intensive Outpatient Programs Organization         Address  Phone  Notes  [place] [address], [place], [place] [phone]   [facility] [address] [place], [place]   ADS: Alcohol & Drug Svcs [address], [place], [place]   [place] [address]  [place], [place] or [phone]   Substance Abuse Resources Organization         Address  Phone  Notes  Alcohol and Drug Services  [phone]   [place]  [phone]   [facility] [place]   Chinita Pester  [phone]   Residential & Outpatient Substance Abuse  Program  [phone]   Psychological Services Organization         Address  Phone  Notes  [facility] [place]  [place]  [phone]   [place] [address] [place] or [phone]    Mobile Crisis Teams Organization         Address  Phone  Notes  [facility], Mobile Crisis Care Unit  [phone]   [facility]  [address] [place],  [place] [phone]   [facility] [address] [place] [phone]    Self-Help/Support Groups Organization         Address  Phone             Notes  [facility] of [place] - variety of support groups  [phone]- [phone] Call for more information  Narcotics Anonymous (NA), [facility] [address], [employer] [place]  2 meetings at this location   Statistician         Address  Phone  Notes  [facility] [address],    [place]  [phone]   [facility]  [address], [place] [address], [place], [place] [phone]   [facility] [address] [place], [place] [phone] Admissions: 8am-3pm M-F  [facility] [address],    [place], [place] [phone]   [facility] [address] [place], [place], [place] [phone]   [facility] [address],  [place], [place] [phone]   [facility] - Intensive Outpatient [address], [place], [place] [phone]   [facility] ([facility] [address] [place].,  [place], [place] [phone] or [phone]   [facility] ([facility]) [address], [place], [place] [phone] Accepts Medicaid  [facility] [place] [address],  [place] [phone] Substance Abuse/Addiction Treatment   [facility] Organization          Address  Phone  Notes  [facility]  [phone]   Angie Fava, PhD [address] Ervin Knack [place], [place]   [phone] or [phone]   [facility] Behavioral   [address] [place], [place] [phone]   [facility] [address] [place], [place] [phone] Insurance/Medicaid/sponsorship through [facility] and Families [address]                                    [place], [place] [phone] Therapy/tele-psych/case  [facility] [address], [place] [phone]    Dr. Lolly Mustache  [phone]   [facility] [place]  [employer] [facility] Dept. 1) [address], [place] 2) [address], [place] 3)  [address], [place] [phone]  [phone]   [facility] Child Abuse Hotline 425-265-2742 or [phone] (After Hours)       Abdominal Pain, Adult Many things can cause belly (abdominal) pain. Most times, the belly pain is not dangerous. Many cases of belly pain can be watched and treated at home. HOME CARE   Do not take medicines that help you go poop (laxatives) unless told to by your doctor.  Only take medicine as told by your doctor.  Eat or drink as told by your doctor. Your doctor will tell you if you should be on a special diet. GET HELP IF:  You do not know what is causing your belly pain.  You have belly pain while you are sick to your stomach (nauseous) or have runny poop (diarrhea).  You have pain while you pee or poop.  Your belly pain wakes you up at night.  You have belly pain that gets worse or better when you eat.  You have belly pain that  gets worse when you eat fatty foods.  You have a fever. GET HELP RIGHT AWAY IF:   The pain does not go away within 2 hours.  You keep throwing up (vomiting).  The pain changes and is only in the right or left part of the belly.  You have bloody or tarry looking poop. MAKE SURE  YOU:   Understand these instructions.  Will watch your condition.  Will get help right away if you are not doing well or get worse.   This information is not intended to replace advice given to you by your health care provider. Make sure you discuss any questions you have with your health care provider.   Document Released: 11/29/2007 Document Revised: 07/03/2014 Document Reviewed: 02/19/2013 Elsevier Interactive Patient Education [employer]2016 Elsevier Inc.

## 2015-05-03 NOTE — ED Notes (Signed)
Pt is in stable condition upon d/c and ambulates from ED. 

## 2015-05-03 NOTE — ED Notes (Signed)
Pt states she has been having hematemesis since before the summer, maybe around May. Pt states she has been here several times and has had a referral placed for GI but is unable to be seen until January. Pt is very tearful upon assessment and states, "I just can't keep living this way, someone is going to have to fix this. I got insurance and everything so someone would help me". Pt states the medications she has been prescribed, zofran and tramadol, are ineffective in controlling stomach pain or nausea/vomiting. Pt states she has been losing weight consistently and is unable to keep anything down.

## 2015-05-03 NOTE — ED Provider Notes (Signed)
CSN: [id number]     Arrival date & time 05/03/15  [id number] History   First MD Initiated Contact with Patient 05/03/15 1014     Chief Complaint  Patient presents with  . Hematemesis     (Consider location/radiation/quality/duration/timing/severity/associated sxs/prior Treatment) The history is provided by the patient and medical records. No language interpreter was used.  Abigail Gray is a 24 y.o. female  who presents to the Emergency Department complaining of epigastric abdominal pain and hematemesis. Patient states her abdominal pain has been going on for months. Yogurt and avoiding processed / greasy foods normally improves symptoms, however over the past 3 days, those remedies have no longer been working. Associated symptoms include headache and non-bloody diarrhea. She began to have episodes of emesis last night. Patient states that the first 3-4 episodes were NBNB, however this morning she noticed dime-sized amounts of blood in her vomit. She has tried home zofran and tramadol with no relief and currently repeating "Take it out, I don't want it anymore" in the room while grabbing her stomach.    Past Medical History  Diagnosis Date  . Hemorrhoids   . UTI (lower urinary tract infection)    History reviewed. No pertinent past surgical history. History reviewed. No pertinent family history. Social History  Substance Use Topics  . Smoking status: Current Every Day Smoker -- 0.50 packs/day    Types: Cigarettes  . Smokeless tobacco: Never Used  . Alcohol Use: No   OB History    No data available     Review of Systems  Constitutional: Positive for appetite change. Negative for fever, chills, diaphoresis and fatigue.  HENT: Negative for congestion, rhinorrhea and sore throat.   Eyes: Negative for visual disturbance.  Respiratory: Negative for cough, shortness of breath and wheezing.   Cardiovascular: Negative.   Gastrointestinal: Positive for nausea, vomiting, abdominal pain and  diarrhea. Negative for constipation, blood in stool and abdominal distention.  Genitourinary: Negative for hematuria and decreased urine volume.  Musculoskeletal: Negative for myalgias, back pain, arthralgias and neck pain.  Skin: Negative for rash.  Neurological: Positive for headaches. Negative for dizziness and weakness.      Allergies  Shellfish allergy  Home Medications   Prior to Admission medications   Medication Sig Start Date End Date Taking? Authorizing Provider  etonogestrel (NEXPLANON) 68 MG IMPL implant 1 each by Subdermal route once. Implanted October 2015    Historical Provider, MD  HYDROcodone-acetaminophen (NORCO/VICODIN) 5-325 MG tablet Take 1-2 tablets by mouth every 6 (six) hours as needed for moderate pain. Patient not taking: Reported on 05/03/2015 05/02/15   Cathren LaineKevin Steinl, MD  ibuprofen (ADVIL,MOTRIN) 600 MG tablet Take 1 tablet (600 mg total) by mouth every 6 (six) hours as needed. Patient not taking: Reported on 05/02/2015 04/25/15   Charm RingsErin J Honig, MD  ondansetron (ZOFRAN ODT) 8 MG disintegrating tablet Take 1 tablet (8 mg total) by mouth every 8 (eight) hours as needed for nausea or vomiting. Patient not taking: Reported on 05/03/2015 05/02/15   Cathren LaineKevin Steinl, MD  promethazine (PHENERGAN) 25 MG tablet Take 1 tablet (25 mg total) by mouth every 6 (six) hours as needed for nausea. 05/03/15   Chase PicketJaime Pilcher Ward, PA-C  traMADol (ULTRAM) 50 MG tablet Take 1 tablet (50 mg total) by mouth every 6 (six) hours as needed. Patient not taking: Reported on 05/03/2015 04/25/15   Charm RingsErin J Honig, MD   BP 131/83 mmHg  Pulse 76  Temp(Src) 97.8 F (36.6 C) (Oral)  Resp  17  Ht  (1.6 m)  Wt 170 lb (77.111 kg)  BMI 30.12 kg/m2  SpO2 100% Physical Exam  Constitutional: She is oriented to person, place, and time.  WDWN CF who appears in pain, but in NAD  HENT:  Head: Normocephalic and atraumatic.  Mouth/Throat: Oropharynx is clear and moist. No oropharyngeal exudate.    Cardiovascular: Normal rate, regular rhythm, normal heart sounds and intact distal pulses.  Exam reveals no gallop and no friction rub.   No murmur heard. Pulmonary/Chest: Effort normal and breath sounds normal. No respiratory distress. She has no wheezes. She has no rales. She exhibits no tenderness.  Abdominal: Normal appearance. She exhibits no mass. There is no hepatosplenomegaly. There is no CVA tenderness.    Abdomen soft, non-distended Tenderness to palpation of upper abdomen as depicted in image. No rebound or guarding Bowel sounds positive in all four quadrants   Musculoskeletal: She exhibits no edema.  Moves all extremities well x 4  Lymphadenopathy:    She has no cervical adenopathy.  Neurological: She is alert and oriented to person, place, and time.  Skin: Skin is warm and dry. No rash noted. She is not diaphoretic.  Psychiatric:  Appears very anxious, crying in room  Nursing note and vitals reviewed.   ED Course  Procedures (including critical care time) Labs Review Labs Reviewed - No data to display  Imaging Review Dg Chest 2 View  05/03/2015  CLINICAL DATA:  Hematemesis EXAM: CHEST  2 VIEW COMPARISON:  09/07/2014 FINDINGS: The heart size and mediastinal contours are within normal limits. Both lungs are clear. The visualized skeletal structures are unremarkable. IMPRESSION: No active cardiopulmonary disease. Electronically Signed   By: Judie Petit.  Shick M.D.   On: 05/03/2015 11:39   I have personally reviewed and evaluated these images and lab results as part of my medical decision-making.   EKG Interpretation None      MDM   Final diagnoses:  Hematemesis  Epigastric pain   Abigail Gray presents with upper abdominal pain and hematemesis. She was seen here for similar symptoms yesterday and chart was reviewed. Because of her reassuring lab work, I do not believe repeat labs are warranted. She has had negative CT and ultrasound in May when presenting with similar  symptoms.  Will order CXR to r/o [place] but unlikely due to lack of chest pain and clear cardiopulmonary exam. Symptomatic treatment including  ativan, gi cocktail, fluids, and zofran.  10:52 AM - Patient threw up GI cocktail, no blood noticed in emesis but will give protonix IV and switch ativan to IV as well.   12:23 PM - Patient asleep and resting comfortably. CXR shows no acute cardiopulm abnormality and no sign of esophageal rupture.   1:19 PM - Tried to call GI clinic to get sooner appointment but she will need referral from PCP in order to be seen sooner. Consulted GI and spoke with Dr. Christella Hartigan who said he cannot see her at this time, especially since no lab abnormalities have been found and this is likely chronic, not urgent. I had hoped he may could get her an earlier appointment, but unfortunately he could not without PCP referral. He is happy to see her inpatient but I do not believe she needs to be admitted at this time.   1:31 PM - I explained the situation to patient thoroughly and stressed she get a PCP who can give her the GI referral. We had an in-depth conversation about the referral process.  She verbalizes understanding and I have answered all questions.    The patient is stable and is discharged to home in good condition.  Filed Vitals:   05/03/15 1245  BP: 131/83  Pulse: 76  Temp:   Resp:      [facility] Ward, PA-C 05/03/15 1338  Benjiman Core, MD 05/03/15 [phone]

## 2015-05-04 ENCOUNTER — Encounter (HOSPITAL_COMMUNITY): Payer: Self-pay

## 2015-05-04 ENCOUNTER — Emergency Department (HOSPITAL_COMMUNITY)
Admission: EM | Admit: 2015-05-04 | Discharge: 2015-05-04 | Disposition: A | Discharge status: Home / Self Care | Payer: BLUE CROSS/BLUE SHIELD | Attending: Emergency Medicine | Admitting: Emergency Medicine

## 2015-05-04 ENCOUNTER — Emergency Department (HOSPITAL_COMMUNITY): Payer: BLUE CROSS/BLUE SHIELD

## 2015-05-04 DIAGNOSIS — Z3202 Encounter for pregnancy test, result negative: Secondary | ICD-10-CM | POA: Insufficient documentation

## 2015-05-04 DIAGNOSIS — Z87440 Personal history of urinary (tract) infections: Secondary | ICD-10-CM | POA: Diagnosis not present

## 2015-05-04 DIAGNOSIS — R630 Anorexia: Secondary | ICD-10-CM | POA: Insufficient documentation

## 2015-05-04 DIAGNOSIS — N83201 Unspecified ovarian cyst, right side: Secondary | ICD-10-CM

## 2015-05-04 DIAGNOSIS — R1032 Left lower quadrant pain: Secondary | ICD-10-CM | POA: Diagnosis not present

## 2015-05-04 DIAGNOSIS — N83202 Unspecified ovarian cyst, left side: Secondary | ICD-10-CM

## 2015-05-04 DIAGNOSIS — N730 Acute parametritis and pelvic cellulitis: Secondary | ICD-10-CM | POA: Insufficient documentation

## 2015-05-04 DIAGNOSIS — Z8719 Personal history of other diseases of the digestive system: Secondary | ICD-10-CM | POA: Insufficient documentation

## 2015-05-04 DIAGNOSIS — R112 Nausea with vomiting, unspecified: Secondary | ICD-10-CM | POA: Diagnosis not present

## 2015-05-04 DIAGNOSIS — N83292 Other ovarian cyst, left side: Secondary | ICD-10-CM | POA: Insufficient documentation

## 2015-05-04 DIAGNOSIS — Z720 Tobacco use: Secondary | ICD-10-CM | POA: Insufficient documentation

## 2015-05-04 DIAGNOSIS — N898 Other specified noninflammatory disorders of vagina: Secondary | ICD-10-CM | POA: Insufficient documentation

## 2015-05-04 DIAGNOSIS — R197 Diarrhea, unspecified: Secondary | ICD-10-CM | POA: Diagnosis not present

## 2015-05-04 DIAGNOSIS — N83291 Other ovarian cyst, right side: Secondary | ICD-10-CM | POA: Diagnosis not present

## 2015-05-04 DIAGNOSIS — R1031 Right lower quadrant pain: Secondary | ICD-10-CM | POA: Diagnosis present

## 2015-05-04 LAB — WET PREP, GENITAL
TRICH WET PREP: NONE SEEN
YEAST WET PREP: NONE SEEN

## 2015-05-04 LAB — COMPREHENSIVE METABOLIC PANEL WITH GFR
ALT: 20 U/L (ref 14–54)
AST: 25 U/L (ref 15–41)
Albumin: 4.1 g/dL (ref 3.5–5.0)
Alkaline Phosphatase: 59 U/L (ref 38–126)
Anion gap: 11 (ref 5–15)
BUN: 6 mg/dL (ref 6–20)
CO2: 21 mmol/L — ABNORMAL LOW (ref 22–32)
Calcium: 9.6 mg/dL (ref 8.9–10.3)
Chloride: 106 mmol/L (ref 101–111)
Creatinine, Ser: 0.71 mg/dL (ref 0.44–1.00)
GFR calc Af Amer: >60 mL/min
GFR calc non Af Amer: >60 mL/min
Glucose, Bld: 111 mg/dL — ABNORMAL HIGH (ref 65–99)
Potassium: 3.5 mmol/L (ref 3.5–5.1)
Sodium: 138 mmol/L (ref 135–145)
Total Bilirubin: 0.5 mg/dL (ref 0.3–1.2)
Total Protein: 7.8 g/dL (ref 6.5–8.1)

## 2015-05-04 LAB — URINALYSIS, ROUTINE W REFLEX MICROSCOPIC
Bilirubin Urine: NEGATIVE
GLUCOSE, UA: NEGATIVE mg/dL
Ketones, ur: NEGATIVE mg/dL
LEUKOCYTES UA: NEGATIVE
Nitrite: NEGATIVE
PH: 6 (ref 5.0–8.0)
Protein, ur: NEGATIVE mg/dL
SPECIFIC GRAVITY, URINE: 1.006 (ref 1.005–1.030)
Urobilinogen, UA: 0.2 mg/dL (ref 0.0–1.0)

## 2015-05-04 LAB — URINE MICROSCOPIC-ADD ON

## 2015-05-04 LAB — CBC
HEMATOCRIT: 43 % (ref 36.0–46.0)
HEMOGLOBIN: 15 g/dL (ref 12.0–15.0)
MCH: 30.9 pg (ref 26.0–34.0)
MCHC: 34.9 g/dL (ref 30.0–36.0)
MCV: 88.5 fL (ref 78.0–100.0)
PLATELETS: 323 10*3/uL (ref 150–400)
RBC: 4.86 MIL/uL (ref 3.87–5.11)
RDW: 12.8 % (ref 11.5–15.5)
WBC: 10.4 10*3/uL (ref 4.0–10.5)

## 2015-05-04 LAB — POC URINE PREG, ED: PREG TEST UR: NEGATIVE

## 2015-05-04 LAB — PREGNANCY, URINE: Preg Test, Ur: NEGATIVE

## 2015-05-04 LAB — LIPASE, BLOOD: LIPASE: 29 U/L (ref 11–51)

## 2015-05-04 MED ORDER — METRONIDAZOLE 500 MG PO TABS: 500.0000 mg | ORAL_TABLET | Freq: Two times a day (BID) | ORAL | Status: DC

## 2015-05-04 MED ORDER — ONDANSETRON HCL 4 MG/2ML IJ SOLN
4.0000 mg | Freq: Once | INTRAMUSCULAR | Status: AC
  Administered 2015-05-04: 4 mg via INTRAVENOUS
  Filled 2015-05-04: qty 2

## 2015-05-04 MED ORDER — MORPHINE SULFATE (PF) 4 MG/ML IV SOLN
4.0000 mg | Freq: Once | INTRAVENOUS | Status: AC
  Administered 2015-05-04: 4 mg via INTRAVENOUS
  Filled 2015-05-04: qty 1

## 2015-05-04 MED ORDER — SODIUM CHLORIDE 0.9 % IV BOLUS (SEPSIS)
1000.0000 mL | Freq: Once | INTRAVENOUS | Status: AC
  Administered 2015-05-04: 1000 mL via INTRAVENOUS

## 2015-05-04 MED ORDER — METOCLOPRAMIDE HCL 5 MG/ML IJ SOLN
10.0000 mg | Freq: Once | INTRAMUSCULAR | Status: AC
  Administered 2015-05-04: 10 mg via INTRAVENOUS
  Filled 2015-05-04: qty 2

## 2015-05-04 MED ORDER — DOXYCYCLINE HYCLATE 100 MG PO CAPS: 100.0000 mg | ORAL_CAPSULE | Freq: Two times a day (BID) | ORAL | Status: DC

## 2015-05-04 MED ORDER — GI COCKTAIL ~~LOC~~
30.0000 mL | Freq: Once | ORAL | Status: AC
  Administered 2015-05-04: 30 mL via ORAL
  Filled 2015-05-04: qty 30

## 2015-05-04 MED ORDER — NAPROXEN 500 MG PO TABS: 500.0000 mg | ORAL_TABLET | Freq: Two times a day (BID) | ORAL | Status: DC

## 2015-05-04 MED ORDER — PROMETHAZINE HCL 25 MG PO TABS: 25.0000 mg | ORAL_TABLET | Freq: Four times a day (QID) | ORAL | Status: DC | PRN

## 2015-05-04 MED ORDER — LIDOCAINE HCL (PF) 1 % IJ SOLN
INTRAMUSCULAR | Status: AC
  Administered 2015-05-04: 1 mL
  Filled 2015-05-04: qty 5

## 2015-05-04 MED ORDER — AZITHROMYCIN 250 MG PO TABS
1000.0000 mg | ORAL_TABLET | Freq: Every day | ORAL | Status: DC
  Administered 2015-05-04: 1000 mg via ORAL
  Filled 2015-05-04: qty 4

## 2015-05-04 MED ORDER — IOHEXOL 300 MG/ML  SOLN
100.0000 mL | Freq: Once | INTRAMUSCULAR | Status: AC | PRN
  Administered 2015-05-04: 100 mL via INTRAVENOUS

## 2015-05-04 MED ORDER — CEFTRIAXONE SODIUM 250 MG IJ SOLR
250.0000 mg | Freq: Once | INTRAMUSCULAR | Status: AC
  Administered 2015-05-04: 250 mg via INTRAMUSCULAR
  Filled 2015-05-04: qty 250

## 2015-05-04 MED ORDER — KETOROLAC TROMETHAMINE 30 MG/ML IJ SOLN
30.0000 mg | Freq: Once | INTRAMUSCULAR | Status: AC
  Administered 2015-05-04: 30 mg via INTRAVENOUS
  Filled 2015-05-04: qty 1

## 2015-05-04 NOTE — ED Notes (Signed)
Pt reports generalized abdominal pain with N/V/D. States she has been here last two days for same. No relief with ODT zofran. Denies fever. Denies blood in stool or emesis. Pt also reports urinary frequency.

## 2015-05-04 NOTE — Discharge Instructions (Signed)

## 2015-05-04 NOTE — ED Notes (Signed)
Pelvic cart setup bedside. 

## 2015-05-04 NOTE — ED Notes (Signed)
Patient reports bilateral lower quadrant abdominal pain x 4 days associated with nausea and vomiting. Patient states she has been seen in the ED for same and pain has continued with no relief. Last episode of vomiting this am. Patient reports polyuria. Pain with palpation to lower quadrant.

## 2015-05-04 NOTE — ED Notes (Signed)
Pt left with all her belongings and ambulated out of the treatment area.  

## 2015-05-04 NOTE — ED Provider Notes (Signed)
CSN: [id number]     Arrival date & time 05/04/15  1531 History   First MD Initiated Contact with Patient 05/04/15 1833     Chief Complaint  Patient presents with  . Abdominal Pain     (Consider location/radiation/quality/duration/timing/severity/associated sxs/prior Treatment) Patient is a 24 y.o. female presenting with abdominal pain.  Abdominal Pain Pain location:  RLQ and LLQ (started at top then worked way down to bottom) Pain quality: sharp   Pain radiates to:  Back Pain severity:  Severe Onset quality:  Sudden Duration:  5 days (has had similar episodes in past, however this one is more severe, has tried to alter diet but episode came back, abd pain off and on for 1 year ) Timing:  Constant Progression:  Worsening Chronicity:  New Context: not alcohol use, not previous surgeries and not trauma   Worsened by:  Palpation Ineffective treatments:  NSAIDs (tramadol, zofran, pepto bismol, sleeping, bath, yogurt) Associated symptoms: anorexia, diarrhea (with spot of bright red blood, less than time, was having diarrhea x days however no stool today), nausea and vomiting (6-7 times today)   Associated symptoms: no chest pain, no chills, no constipation, no cough, no dysuria, no fever, no shortness of breath, no sore throat, no vaginal bleeding and no vaginal discharge   Risk factors: no alcohol abuse and has not had multiple surgeries     Past Medical History  Diagnosis Date  . Hemorrhoids   . UTI (lower urinary tract infection)    History reviewed. No pertinent past surgical history. No family history on file. Social History  Substance Use Topics  . Smoking status: Current Every Day Smoker -- 0.50 packs/day    Types: Cigarettes  . Smokeless tobacco: Never Used  . Alcohol Use: No   OB History    No data available     Review of Systems  Constitutional: Negative for fever and chills.  HENT: Negative for sore throat.   Eyes: Negative for visual disturbance.  Respiratory:  Negative for cough and shortness of breath.   Cardiovascular: Negative for chest pain.  Gastrointestinal: Positive for nausea, vomiting (6-7 times today), abdominal pain, diarrhea (with spot of bright red blood, less than time, was having diarrhea x days however no stool today) and anorexia. Negative for constipation.  Genitourinary: Negative for dysuria, vaginal bleeding, vaginal discharge and difficulty urinating.  Musculoskeletal: Negative for back pain and neck pain.  Skin: Negative for rash.  Neurological: Negative for syncope and headaches.      Allergies  Shellfish allergy  Home Medications   Prior to Admission medications   Medication Sig Start Date End Date Taking? Authorizing Provider  etonogestrel (NEXPLANON) 68 MG IMPL implant 1 each by Subdermal route once. Implanted October 2015   Yes Historical Provider, MD  ondansetron (ZOFRAN ODT) 8 MG disintegrating tablet Take 1 tablet (8 mg total) by mouth every 8 (eight) hours as needed for nausea or vomiting. 05/02/15  Yes Cathren LaineKevin Steinl, MD  doxycycline (VIBRAMYCIN) 100 MG capsule Take 1 capsule (100 mg total) by mouth 2 (two) times daily. 05/04/15   Alvira MondayErin Coraleigh Sheeran, MD  HYDROcodone-acetaminophen (NORCO/VICODIN) 5-325 MG tablet Take 1-2 tablets by mouth every 6 (six) hours as needed for moderate pain. Patient not taking: Reported on 05/03/2015 05/02/15   Cathren LaineKevin Steinl, MD  ibuprofen (ADVIL,MOTRIN) 600 MG tablet Take 1 tablet (600 mg total) by mouth every 6 (six) hours as needed. Patient not taking: Reported on 05/02/2015 04/25/15   Charm RingsErin J Honig, MD  metroNIDAZOLE (FLAGYL)  500 MG tablet Take 1 tablet (500 mg total) by mouth 2 (two) times daily. 05/04/15   Alvira Monday, MD  naproxen (NAPROSYN) 500 MG tablet Take 1 tablet (500 mg total) by mouth 2 (two) times daily. 05/04/15   Alvira Monday, MD  promethazine (PHENERGAN) 25 MG tablet Take 1 tablet (25 mg total) by mouth every 6 (six) hours as needed for nausea or vomiting. 05/04/15   Alvira Monday, MD  traMADol (ULTRAM) 50 MG tablet Take 1 tablet (50 mg total) by mouth every 6 (six) hours as needed. Patient not taking: Reported on 05/03/2015 04/25/15   Charm Rings, MD   BP 124/95 mmHg  Pulse 76  Temp(Src) 98 F (36.7 C) (Oral)  Resp 18  SpO2 99% Physical Exam  Constitutional: She is oriented to person, place, and time. She appears well-developed and well-nourished. No distress.  HENT:  Head: Normocephalic and atraumatic.  Eyes: Conjunctivae and EOM are normal.  Neck: Normal range of motion.  Cardiovascular: Normal rate, regular rhythm, normal heart sounds and intact distal pulses.  Exam reveals no gallop and no friction rub.   No murmur heard. Pulmonary/Chest: Effort normal and breath sounds normal. No respiratory distress. She has no wheezes. She has no rales.  Abdominal: Soft. She exhibits no distension. There is tenderness (bilateral lower abdomen, worse on left). There is no guarding.  Genitourinary: Uterus is tender. Cervix exhibits motion tenderness and discharge. Right adnexum displays tenderness. Left adnexum displays tenderness. Vaginal discharge found.  Musculoskeletal: She exhibits no edema or tenderness.  Neurological: She is alert and oriented to person, place, and time.  Skin: Skin is warm and dry. No rash noted. She is not diaphoretic. No erythema.  Nursing note and vitals reviewed.   ED Course  Procedures (including critical care time) Labs Review Labs Reviewed  WET PREP, GENITAL - Abnormal; Notable for the following:    Clue Cells Wet Prep HPF POC FEW (*)    WBC, Wet Prep HPF POC MODERATE (*)    All other components within normal limits  COMPREHENSIVE METABOLIC PANEL - Abnormal; Notable for the following:    CO2 21 (*)    Glucose, Bld 111 (*)    All other components within normal limits  URINALYSIS, ROUTINE W REFLEX MICROSCOPIC (NOT AT [facility]) - Abnormal; Notable for the following:    Color, Urine STRAW (*)    APPearance CLOUDY (*)    Hgb  urine dipstick TRACE (*)    All other components within normal limits  URINE MICROSCOPIC-ADD ON - Abnormal; Notable for the following:    Squamous Epithelial / LPF MANY (*)    Bacteria, UA MANY (*)    All other components within normal limits  LIPASE, BLOOD  CBC  PREGNANCY, URINE  POC URINE PREG, ED  GC/CHLAMYDIA PROBE AMP ([place]) NOT AT [facility]    Imaging Review Ct Abdomen Pelvis W Contrast  05/04/2015  CLINICAL DATA:  Bilateral lower abdominal pain and decreased appetite today. EXAM: CT ABDOMEN AND PELVIS WITH CONTRAST TECHNIQUE: Multidetector CT imaging of the abdomen and pelvis was performed using the standard protocol following bolus administration of intravenous contrast. CONTRAST:  OMNIPAQUE IOHEXOL 300 MG/ML  SOLN COMPARISON:  11/19/2014 FINDINGS: Lower chest: The lung bases are clear of acute process. No pleural effusion or pulmonary lesions. The heart is normal in size. No pericardial effusion. The distal esophagus and aorta are unremarkable. Hepatobiliary: No focal hepatic lesions or intrahepatic biliary dilatation. The gallbladder is normal. No common bile  duct dilatation. Pancreas: No mass, inflammation or ductal dilatation. Spleen: Normal size.  No focal lesions. Adrenals/Urinary Tract: The adrenal glands are normal. Both kidneys are normal. Stomach/Bowel: The stomach, duodenum, small bowel and colon are unremarkable. No inflammatory changes, mass lesions or obstructive findings. The terminal ileum is normal. The appendix is normal. Vascular/Lymphatic: No mesenteric or retroperitoneal mass or adenopathy. Scattered small lymph nodes are noted. The aorta and branch vessels are patent. The major venous structures are patent. Reproductive: The uterus is normal and stable. Stable bilateral ovarian cysts. The cyst on the left is likely hemorrhagic. No significant change since 2015. Other: No pelvic mass or adenopathy. No free pelvic fluid collections. No inguinal mass or adenopathy.  No significant bony findings. Musculoskeletal: No significant bony findings. IMPRESSION: No acute abdominal/ pelvic findings, mass lesions or adenopathy. Stable bilateral ovarian lesions. Electronically Signed   By: Rudie Meyer M.D.   On: 05/04/2015 21:54   I have personally reviewed and evaluated these images and lab results as part of my medical decision-making.   EKG Interpretation None      MDM   Final diagnoses:  PID (acute pelvic inflammatory disease)  Cysts of both ovaries, likely hemorrhagic on left   24 year old female with history of ovarian cysts, history of likely cyclic abdominal pain and vomiting, presents with concern of abdominal pain. Patient was seen in the emergency department 2 prior days.  Given pt describes this episode as more severe, longer than prior and has lower abdominal tenderness on exam, CT ordered which shows no acute abnormalities.  Urinalysis shows contamination however no sign of infection. Lipase WNL.  CT does show stable bilateral ovarian cysts with likely hemorrhagic cyst on the left which may be contributing to patient's pain. History is not consistent with ovarian torsion. Pelvic exam shows some discharge, significant CMT, and uterine and adnesxal tenderness and will treat for PID. Given rocephin/azithro and rx for doxycycline and flagyl for 14 days.  Recommend outpt follow up with PCP, GI follow up as discussed prior and OBGYN follow up for ovarian cysts. Patient discharged in stable condition with understanding of reasons to return.   Alvira Monday, MD 05/05/15 1114

## 2015-05-05 LAB — GC/CHLAMYDIA PROBE AMP (~~LOC~~) NOT AT ARMC
Chlamydia: NEGATIVE
NEISSERIA GONORRHEA: NEGATIVE

## 2015-05-21 ENCOUNTER — Encounter (HOSPITAL_COMMUNITY): Payer: Self-pay | Admitting: Emergency Medicine

## 2015-05-21 ENCOUNTER — Emergency Department (HOSPITAL_COMMUNITY)
Admission: EM | Admit: 2015-05-21 | Discharge: 2015-05-21 | Disposition: A | Discharge status: Home / Self Care | Payer: BLUE CROSS/BLUE SHIELD | Attending: Emergency Medicine | Admitting: Emergency Medicine

## 2015-05-21 DIAGNOSIS — F17210 Nicotine dependence, cigarettes, uncomplicated: Secondary | ICD-10-CM | POA: Diagnosis not present

## 2015-05-21 DIAGNOSIS — F419 Anxiety disorder, unspecified: Secondary | ICD-10-CM | POA: Insufficient documentation

## 2015-05-21 DIAGNOSIS — Z87440 Personal history of urinary (tract) infections: Secondary | ICD-10-CM | POA: Diagnosis not present

## 2015-05-21 DIAGNOSIS — K279 Peptic ulcer, site unspecified, unspecified as acute or chronic, without hemorrhage or perforation: Secondary | ICD-10-CM | POA: Insufficient documentation

## 2015-05-21 DIAGNOSIS — R1013 Epigastric pain: Secondary | ICD-10-CM | POA: Diagnosis present

## 2015-05-21 DIAGNOSIS — Z3202 Encounter for pregnancy test, result negative: Secondary | ICD-10-CM | POA: Insufficient documentation

## 2015-05-21 DIAGNOSIS — Z8719 Personal history of other diseases of the digestive system: Secondary | ICD-10-CM | POA: Insufficient documentation

## 2015-05-21 DIAGNOSIS — R1084 Generalized abdominal pain: Secondary | ICD-10-CM

## 2015-05-21 DIAGNOSIS — F329 Major depressive disorder, single episode, unspecified: Secondary | ICD-10-CM | POA: Insufficient documentation

## 2015-05-21 LAB — URINALYSIS, ROUTINE W REFLEX MICROSCOPIC
Bilirubin Urine: NEGATIVE
Glucose, UA: NEGATIVE mg/dL
Hgb urine dipstick: NEGATIVE
Ketones, ur: NEGATIVE mg/dL
LEUKOCYTES UA: NEGATIVE
NITRITE: NEGATIVE
PH: 6 (ref 5.0–8.0)
Protein, ur: NEGATIVE mg/dL
SPECIFIC GRAVITY, URINE: 1.017 (ref 1.005–1.030)

## 2015-05-21 LAB — COMPREHENSIVE METABOLIC PANEL
ALBUMIN: 4.1 g/dL (ref 3.5–5.0)
ALT: 15 U/L (ref 14–54)
AST: 20 U/L (ref 15–41)
Alkaline Phosphatase: 58 U/L (ref 38–126)
Anion gap: 8 (ref 5–15)
BILIRUBIN TOTAL: 0.4 mg/dL (ref 0.3–1.2)
BUN: 8 mg/dL (ref 6–20)
CHLORIDE: 103 mmol/L (ref 101–111)
CO2: 27 mmol/L (ref 22–32)
CREATININE: 0.88 mg/dL (ref 0.44–1.00)
Calcium: 9.3 mg/dL (ref 8.9–10.3)
GFR calc Af Amer: >60 mL/min (ref >60)
GFR calc non Af Amer: >60 mL/min (ref >60)
GLUCOSE: 112 mg/dL — AB (ref 65–99)
POTASSIUM: 3.8 mmol/L (ref 3.5–5.1)
Sodium: 138 mmol/L (ref 135–145)
Total Protein: 7.3 g/dL (ref 6.5–8.1)

## 2015-05-21 LAB — CBC
HEMATOCRIT: 43.5 % (ref 36.0–46.0)
Hemoglobin: 15 g/dL (ref 12.0–15.0)
MCH: 31.3 pg (ref 26.0–34.0)
MCHC: 34.5 g/dL (ref 30.0–36.0)
MCV: 90.6 fL (ref 78.0–100.0)
PLATELETS: 321 10*3/uL (ref 150–400)
RBC: 4.8 MIL/uL (ref 3.87–5.11)
RDW: 13.2 % (ref 11.5–15.5)
WBC: 10.4 10*3/uL (ref 4.0–10.5)

## 2015-05-21 LAB — POC URINE PREG, ED: Preg Test, Ur: NEGATIVE

## 2015-05-21 LAB — LIPASE, BLOOD: LIPASE: 55 U/L — AB (ref 11–51)

## 2015-05-21 MED ORDER — ONDANSETRON HCL 4 MG/2ML IJ SOLN
4.0000 mg | Freq: Once | INTRAMUSCULAR | Status: AC | PRN
  Administered 2015-05-21: 4 mg via INTRAVENOUS
  Filled 2015-05-21: qty 2

## 2015-05-21 MED ORDER — SUCRALFATE 1 GM/10ML PO SUSP: 1.0000 g | Freq: Three times a day (TID) | ORAL | Status: DC

## 2015-05-21 MED ORDER — METOCLOPRAMIDE HCL 5 MG/ML IJ SOLN
10.0000 mg | Freq: Once | INTRAMUSCULAR | Status: AC
  Administered 2015-05-21: 10 mg via INTRAVENOUS
  Filled 2015-05-21: qty 2

## 2015-05-21 MED ORDER — FENTANYL CITRATE (PF) 100 MCG/2ML IJ SOLN
50.0000 ug | Freq: Once | INTRAMUSCULAR | Status: AC
  Administered 2015-05-21: 50 ug via INTRAVENOUS
  Filled 2015-05-21: qty 2

## 2015-05-21 MED ORDER — LORAZEPAM 2 MG/ML IJ SOLN
0.5000 mg | Freq: Once | INTRAMUSCULAR | Status: AC
Start: 2015-05-21 — End: 2015-05-21
  Administered 2015-05-21: 0.5 mg via INTRAVENOUS
  Filled 2015-05-21: qty 1

## 2015-05-21 MED ORDER — SODIUM CHLORIDE 0.9 % IV BOLUS (SEPSIS)
1000.0000 mL | Freq: Once | INTRAVENOUS | Status: AC
  Administered 2015-05-21: 1000 mL via INTRAVENOUS

## 2015-05-21 MED ORDER — OMEPRAZOLE 20 MG PO CPDR: 20.0000 mg | DELAYED_RELEASE_CAPSULE | Freq: Every day | ORAL | Status: DC

## 2015-05-21 MED ORDER — PANTOPRAZOLE SODIUM 40 MG IV SOLR
40.0000 mg | Freq: Once | INTRAVENOUS | Status: AC
  Administered 2015-05-21: 40 mg via INTRAVENOUS
  Filled 2015-05-21: qty 40

## 2015-05-21 MED ORDER — HYDROCODONE-ACETAMINOPHEN 5-325 MG PO TABS: 1.0000 | ORAL_TABLET | Freq: Four times a day (QID) | ORAL | Status: DC | PRN

## 2015-05-21 MED ORDER — FENTANYL CITRATE (PF) 100 MCG/2ML IJ SOLN
100.0000 ug | Freq: Once | INTRAMUSCULAR | Status: AC
  Administered 2015-05-21: 100 ug via INTRAVENOUS
  Filled 2015-05-21: qty 2

## 2015-05-21 NOTE — Discharge Instructions (Signed)

## 2015-05-21 NOTE — ED Notes (Signed)
Unhook pt.from monitor so that pt.could use bathroom

## 2015-05-21 NOTE — ED Notes (Signed)
Pt arrived to ED from home complaining of abdominal pain with nausea and vomiting that stated approximately 1700 yesterday. Pt states nausea and emesis only occur after eating. Pt states emesis is in large quantities and chunky. Pt states throat "burns" due to emesis. Pt is alert and oriented x 4, with slightly tachycardic pulse (105).

## 2015-05-21 NOTE — Care Management (Signed)
ED CM consulted regarding referral to GYN. Patient has had 8 ED visits  the past 6 months for related c/o abdominal pains. CM met with patient, she reports not being able to get an appointment with a GYN. CM offered assistance, discussed the [facility] with patient she was amendable. Referral placed via Epic for [facility]. Explained to patient the office will contact her at [phone] to schedule an appointment teach back done patient verbalized understanding. Information placed on discharge packet. Updated Burr Medico PA-C. No further ED CM needs identified.

## 2015-05-21 NOTE — ED Provider Notes (Signed)
CSN: [id number]     Arrival date & time 05/21/15  0457 History   First MD Initiated Contact with Patient 05/21/15 [phone]     Chief Complaint  Patient presents with  . Abdominal Pain  . Emesis     (Consider location/radiation/quality/duration/timing/severity/associated sxs/prior Treatment) HPI   Patient to the ER with complaints of abdominal pains. She has had 8 ER visits this year, many of them for anxiety, CP or abdominal pain. She was dropped by her PCP Deboraha Sprang([facility]), per patient because they couldn't find anything wrong with her. She was referred to GI who refuses to see her because, per patient, she has normal CT scans. She has had two CT scans of the abdomen and pelvis this year, including one earlier this month.  She says that a PA has called all the GI doctors trying to get someone to see her but no one will see her. She is tearful, denies being SI, but says she can't live with this pain anymore.   Her pain is epigastric, sharp and includes severe nausea and vomiting. She has missed a lot of work due to this and is concerned about loosing her job. Her symptoms worsened at 5 pm last night, symptoms exacerbated by eating. Large quantities of emesis with burning in her throat.   ROS: The patient denies diaphoresis, fever, headache, weakness (general or focal), confusion, change of vision,  dysphagia, aphagia, shortness of breath,diarrhea, lower extremity swelling, rash, neck pain, chest pain    Past Medical History  Diagnosis Date  . Hemorrhoids   . UTI (lower urinary tract infection)    History reviewed. No pertinent past surgical history. History reviewed. No pertinent family history. Social History  Substance Use Topics  . Smoking status: Current Every Day Smoker -- 0.50 packs/day    Types: Cigarettes  . Smokeless tobacco: Never Used  . Alcohol Use: No   OB History    No data available     Review of Systems  ROS: See HPI Constitutional: no fever  Eyes: no  drainage  ENT: no runny nose  Cardiovascular: no chest pain  Resp: no SOB  GI: no diarrhea GU: no dysuria Integumentary: no rash  Allergy: no hives  Musculoskeletal: no leg swelling  Neurological: no slurred speech ROS otherwise negative   Allergies  Doxycycline and Shellfish allergy  Home Medications   Prior to Admission medications   Medication Sig Start Date End Date Taking? Authorizing Provider  etonogestrel (NEXPLANON) 68 MG IMPL implant 1 each by Subdermal route once. Implanted October 2015   Yes Historical Provider, MD  promethazine (PHENERGAN) 25 MG tablet Take 1 tablet (25 mg total) by mouth every 6 (six) hours as needed for nausea or vomiting. 05/04/15  Yes Alvira MondayErin Schlossman, MD  HYDROcodone-acetaminophen (NORCO/VICODIN) 5-325 MG tablet Take 1-2 tablets by mouth every 6 (six) hours as needed for severe pain. 05/21/15   Joseeduardo Brix Neva SeatGreene, PA-C   BP 106/58 mmHg  Pulse 78  Temp(Src) 97.7 F (36.5 C) (Oral)  Resp 18  Ht 5\' 3"  (1.6 m)  Wt 77.111 kg  BMI 30.12 kg/m2  SpO2 100%   Physical Exam  Constitutional: She appears well-developed and well-nourished. No distress.  HENT:  Head: Normocephalic and atraumatic.  Eyes: Pupils are equal, round, and reactive to light.  Neck: Normal range of motion. Neck supple.  Cardiovascular: Normal rate and regular rhythm.   Pulmonary/Chest: Effort normal.  Abdominal: Soft. Bowel sounds are normal. She exhibits no distension. There is tenderness in the  epigastric area. There is no rigidity, no rebound, no guarding and no CVA tenderness.  Neurological: She is alert.  Skin: Skin is warm and dry.  Psychiatric: Her mood appears anxious. She exhibits a depressed mood.  + tearful  Nursing note and vitals reviewed.   ED Course  Procedures (including critical care time) Labs Review Labs Reviewed  LIPASE, BLOOD - Abnormal; Notable for the following:    Lipase 55 (*)    All other components within normal limits  COMPREHENSIVE  METABOLIC PANEL - Abnormal; Notable for the following:    Glucose, Bld 112 (*)    All other components within normal limits  URINALYSIS, ROUTINE W REFLEX MICROSCOPIC (NOT AT [facility]) - Abnormal; Notable for the following:    APPearance CLOUDY (*)    All other components within normal limits  CBC  POC URINE PREG, ED    Imaging Review No results found. I have personally reviewed and evaluated these images and lab results as part of my medical decision-making.   EKG Interpretation None      MDM   Final diagnoses:  Generalized abdominal pain    Pt does have clinical or laboratory indication for repeat scan.  Her pain was controlled in the ED. She was dropped by her PCP and having difficulty getting in to see a GI doctor. Case Manager consulted to see what assistance she can give to patient. Ready for discharge afterwards. -- pt [facility] with [facility]. From there they will refer her to GI if needed.  Rx: Vicodin. She has Phenergan at home. Unclear is symptoms are gynecological or due to gastric concern.  Medications  ondansetron (ZOFRAN) injection 4 mg (4 mg Intravenous Given 05/21/15 0601)  fentaNYL (SUBLIMAZE) injection 50 mcg (50 mcg Intravenous Given 05/21/15 0602)  sodium chloride 0.9 % bolus 1,000 mL (0 mLs Intravenous Stopped 05/21/15 0817)  metoCLOPramide (REGLAN) injection 10 mg (10 mg Intravenous Given 05/21/15 0650)  pantoprazole (PROTONIX) injection 40 mg (40 mg Intravenous Given 05/21/15 0650)  LORazepam (ATIVAN) injection 0.5 mg (0.5 mg Intravenous Given 05/21/15 0653)  fentaNYL (SUBLIMAZE) injection 100 mcg (100 mcg Intravenous Given 05/21/15 0814)     I feel the patient has had an appropriate workup for their chief complaint at this time and likelihood of emergent condition existing is low. Discussed s/sx that warrant return to the ED.  Filed Vitals:   05/21/15 0825 05/21/15 0851  BP: 113/70 106/58  Pulse: 69 78  Temp: 97.7 F (36.5  C)   Resp: 18 [address] PA-C 05/21/15 0933  Marlon Pel, PA-C 05/21/15 1002  Derwood Kaplan, MD 05/22/15 [phone]

## 2015-05-21 NOTE — ED Notes (Signed)
Spoke to [employer], PA-C, in regards to patient's concern for resources available if she loses her job. She acknowledges, plans for case manager to follow up.

## 2015-05-21 NOTE — ED Notes (Signed)
Concerns for income problem, about to lose job and primary care provider.

## 2015-05-21 NOTE — ED Notes (Signed)
Dr. Nanavanti at the bedside.  

## 2015-06-02 ENCOUNTER — Encounter: Payer: BLUE CROSS/BLUE SHIELD | Admitting: Obstetrics & Gynecology

## 2015-06-29 ENCOUNTER — Emergency Department (HOSPITAL_COMMUNITY): Payer: BLUE CROSS/BLUE SHIELD

## 2015-06-29 ENCOUNTER — Emergency Department (HOSPITAL_COMMUNITY)
Admission: EM | Admit: 2015-06-29 | Discharge: 2015-06-29 | Disposition: A | Discharge status: Home / Self Care | Payer: BLUE CROSS/BLUE SHIELD | Attending: Emergency Medicine | Admitting: Emergency Medicine

## 2015-06-29 ENCOUNTER — Encounter (HOSPITAL_COMMUNITY): Payer: Self-pay

## 2015-06-29 DIAGNOSIS — R1084 Generalized abdominal pain: Secondary | ICD-10-CM | POA: Diagnosis not present

## 2015-06-29 DIAGNOSIS — Z87440 Personal history of urinary (tract) infections: Secondary | ICD-10-CM | POA: Diagnosis not present

## 2015-06-29 DIAGNOSIS — F17210 Nicotine dependence, cigarettes, uncomplicated: Secondary | ICD-10-CM | POA: Insufficient documentation

## 2015-06-29 DIAGNOSIS — R102 Pelvic and perineal pain: Secondary | ICD-10-CM | POA: Diagnosis present

## 2015-06-29 DIAGNOSIS — Z3202 Encounter for pregnancy test, result negative: Secondary | ICD-10-CM | POA: Insufficient documentation

## 2015-06-29 DIAGNOSIS — R1031 Right lower quadrant pain: Secondary | ICD-10-CM

## 2015-06-29 DIAGNOSIS — R109 Unspecified abdominal pain: Secondary | ICD-10-CM

## 2015-06-29 LAB — URINE MICROSCOPIC-ADD ON: RBC / HPF: NONE SEEN RBC/hpf (ref 0–5)

## 2015-06-29 LAB — CBC WITH DIFFERENTIAL/PLATELET
BASOS PCT: 0 %
Basophils Absolute: 0 10*3/uL (ref 0.0–0.1)
Eosinophils Absolute: 0 10*3/uL (ref 0.0–0.7)
Eosinophils Relative: 0 %
HEMATOCRIT: 42.1 % (ref 36.0–46.0)
HEMOGLOBIN: 14.2 g/dL (ref 12.0–15.0)
Lymphocytes Relative: 14 %
Lymphs Abs: 1.4 10*3/uL (ref 0.7–4.0)
MCH: 30.3 pg (ref 26.0–34.0)
MCHC: 33.7 g/dL (ref 30.0–36.0)
MCV: 90 fL (ref 78.0–100.0)
MONOS PCT: 5 %
Monocytes Absolute: 0.5 10*3/uL (ref 0.1–1.0)
NEUTROS ABS: 8.3 10*3/uL — AB (ref 1.7–7.7)
NEUTROS PCT: 81 %
Platelets: 295 10*3/uL (ref 150–400)
RBC: 4.68 MIL/uL (ref 3.87–5.11)
RDW: 13.4 % (ref 11.5–15.5)
WBC: 10.2 10*3/uL (ref 4.0–10.5)

## 2015-06-29 LAB — WET PREP, GENITAL
SPERM: NONE SEEN
TRICH WET PREP: NONE SEEN
YEAST WET PREP: NONE SEEN

## 2015-06-29 LAB — URINALYSIS, ROUTINE W REFLEX MICROSCOPIC
Glucose, UA: NEGATIVE mg/dL
Hgb urine dipstick: NEGATIVE
Ketones, ur: 15 mg/dL — AB
LEUKOCYTES UA: NEGATIVE
NITRITE: NEGATIVE
PH: 6 (ref 5.0–8.0)
Protein, ur: NEGATIVE mg/dL
SPECIFIC GRAVITY, URINE: 1.029 (ref 1.005–1.030)

## 2015-06-29 LAB — COMPREHENSIVE METABOLIC PANEL
ALBUMIN: 4 g/dL (ref 3.5–5.0)
ALT: 21 U/L (ref 14–54)
ANION GAP: 8 (ref 5–15)
AST: 20 U/L (ref 15–41)
Alkaline Phosphatase: 56 U/L (ref 38–126)
BUN: 7 mg/dL (ref 6–20)
CHLORIDE: 109 mmol/L (ref 101–111)
CO2: 22 mmol/L (ref 22–32)
Calcium: 9.2 mg/dL (ref 8.9–10.3)
Creatinine, Ser: 0.75 mg/dL (ref 0.44–1.00)
GFR calc non Af Amer: >60 mL/min (ref >60)
GLUCOSE: 119 mg/dL — AB (ref 65–99)
POTASSIUM: 3.7 mmol/L (ref 3.5–5.1)
Sodium: 139 mmol/L (ref 135–145)
Total Bilirubin: 0.4 mg/dL (ref 0.3–1.2)
Total Protein: 7.1 g/dL (ref 6.5–8.1)

## 2015-06-29 LAB — I-STAT BETA HCG BLOOD, ED (MC, WL, AP ONLY)

## 2015-06-29 LAB — LIPASE, BLOOD: Lipase: 29 U/L (ref 11–51)

## 2015-06-29 MED ORDER — LIDOCAINE HCL (PF) 1 % IJ SOLN
INTRAMUSCULAR | Status: AC
  Administered 2015-06-29: 5 mL
  Filled 2015-06-29: qty 5

## 2015-06-29 MED ORDER — ONDANSETRON 4 MG PO TBDP
8.0000 mg | ORAL_TABLET | Freq: Once | ORAL | Status: AC
  Administered 2015-06-29: 8 mg via ORAL
  Filled 2015-06-29: qty 2

## 2015-06-29 MED ORDER — AZITHROMYCIN 250 MG PO TABS
1000.0000 mg | ORAL_TABLET | Freq: Once | ORAL | Status: AC
  Administered 2015-06-29: 1000 mg via ORAL
  Filled 2015-06-29: qty 4

## 2015-06-29 MED ORDER — IBUPROFEN 600 MG PO TABS: 600.0000 mg | ORAL_TABLET | Freq: Four times a day (QID) | ORAL | Status: DC | PRN

## 2015-06-29 MED ORDER — METRONIDAZOLE 500 MG PO TABS: 500.0000 mg | ORAL_TABLET | Freq: Two times a day (BID) | ORAL | Status: DC

## 2015-06-29 MED ORDER — KETOROLAC TROMETHAMINE 60 MG/2ML IM SOLN
60.0000 mg | Freq: Once | INTRAMUSCULAR | Status: AC
  Administered 2015-06-29: 60 mg via INTRAMUSCULAR
  Filled 2015-06-29: qty 2

## 2015-06-29 MED ORDER — CEFTRIAXONE SODIUM 250 MG IJ SOLR
250.0000 mg | Freq: Once | INTRAMUSCULAR | Status: AC
  Administered 2015-06-29: 250 mg via INTRAMUSCULAR
  Filled 2015-06-29: qty 250

## 2015-06-29 MED ORDER — FENTANYL CITRATE (PF) 100 MCG/2ML IJ SOLN
50.0000 ug | Freq: Once | INTRAMUSCULAR | Status: AC
  Administered 2015-06-29: 50 ug via INTRAMUSCULAR
  Filled 2015-06-29: qty 2

## 2015-06-29 MED ORDER — LORAZEPAM 1 MG PO TABS
1.0000 mg | ORAL_TABLET | Freq: Once | ORAL | Status: AC
  Administered 2015-06-29: 1 mg via ORAL
  Filled 2015-06-29: qty 1

## 2015-06-29 MED ORDER — METOCLOPRAMIDE HCL 10 MG PO TABS
10.0000 mg | ORAL_TABLET | Freq: Once | ORAL | Status: AC
  Administered 2015-06-29: 10 mg via ORAL
  Filled 2015-06-29: qty 1

## 2015-06-29 MED ORDER — LORAZEPAM 2 MG/ML IJ SOLN: 1.0000 mg | Freq: Once | INTRAMUSCULAR | Status: DC

## 2015-06-29 MED ORDER — DOXYCYCLINE HYCLATE 100 MG PO CAPS: 100.0000 mg | ORAL_CAPSULE | Freq: Two times a day (BID) | ORAL | Status: DC

## 2015-06-29 NOTE — ED Notes (Signed)
Pt verbalized understanding of d/c instructions, prescriptions, and follow-up care. No further questions/concerns, VSS, ambulatory w/ steady gait (refused wheelchair) 

## 2015-06-29 NOTE — ED Provider Notes (Signed)
CSN: [id number]     Arrival date & time 06/29/15  [id number] History   First MD Initiated Contact with Patient 06/29/15 0732     Chief Complaint  Patient presents with  . Pelvic Pain     (Consider location/radiation/quality/duration/timing/severity/associated sxs/prior Treatment) HPI Abigail Gray is a 25 y.o. female with a reported history of ovarian cyst comes in for evaluation of acute pain from her chronic ovarian cyst pain. Patient reports she has had ovarian cyst pain intermittently over the past one year. Also reports that she has been evaluated for this same problem by OB and GYN without any significant findings. She reports her current exacerbation started 3 days ago and has been gradually worsening. Reports pain is "exactly like previous pain". She tried to treat her pain at home with warm compresses and Zofran, but this was ineffective. She has been seen in the emergency department for apparent chronic abdominal and pelvic pain. She has had 2 abdominal CTs this past year and 10 total that shows stable ovarian cysts.  Past Medical History  Diagnosis Date  . Hemorrhoids   . UTI (lower urinary tract infection)    History reviewed. No pertinent past surgical history. History reviewed. No pertinent family history. Social History  Substance Use Topics  . Smoking status: Current Every Day Smoker -- 0.50 packs/day    Types: Cigarettes  . Smokeless tobacco: Never Used  . Alcohol Use: No   OB History    No data available     Review of Systems A 10 point review of systems was completed and was negative except for pertinent positives and negatives as mentioned in the history of present illness     Allergies  Doxycycline and Shellfish allergy  Home Medications   Prior to Admission medications   Medication Sig Start Date End Date Taking? Authorizing Provider  etonogestrel (NEXPLANON) 68 MG IMPL implant 1 each by Subdermal route once. Implanted October 2015   Yes Historical  Provider, MD  ondansetron (ZOFRAN-ODT) 8 MG disintegrating tablet Take 8 mg by mouth every 8 (eight) hours as needed. 05/06/15  Yes Historical Provider, MD  doxycycline (VIBRAMYCIN) 100 MG capsule Take 1 capsule (100 mg total) by mouth 2 (two) times daily. One po bid x 7 days 06/29/15   Joycie PeekBenjamin Camden Knotek, PA-C  ibuprofen (ADVIL,MOTRIN) 600 MG tablet Take 1 tablet (600 mg total) by mouth every 6 (six) hours as needed. 06/29/15   Joycie PeekBenjamin Zoelle Markus, PA-C  metroNIDAZOLE (FLAGYL) 500 MG tablet Take 1 tablet (500 mg total) by mouth 2 (two) times daily. 06/29/15   Joycie PeekBenjamin Itzabella Sorrels, PA-C   BP 115/73 mmHg  Pulse 69  Temp(Src) 97.6 F (36.4 C) (Oral)  Resp 14  Ht 5\' 3"  (1.6 m)  Wt 77.111 kg  BMI 30.12 kg/m2  SpO2 99% Physical Exam  Constitutional: She is oriented to person, place, and time. She appears well-developed and well-nourished.  HENT:  Head: Normocephalic and atraumatic.  Mouth/Throat: Oropharynx is clear and moist.  Eyes: Conjunctivae are normal. Pupils are equal, round, and reactive to light. Right eye exhibits no discharge. Left eye exhibits no discharge. No scleral icterus.  Neck: Neck supple.  Cardiovascular: Normal rate, regular rhythm and normal heart sounds.   Pulmonary/Chest: Effort normal and breath sounds normal. No respiratory distress. She has no wheezes. She has no rales.  Abdominal: Soft.  Diffuse tenderness with palpation throughout right upper and lower abdomen. No other focal tenderness. Abdomen is otherwise soft, nondistended. No rebound or guarding. No peritoneal signs.  No other lesions or deformities.  Genitourinary:  Chaperone was present for the entire genital exam. No lesions or rashes appreciated on vulva. Cervix visualized on speculum exam and appropriate cultures sampled. Scant blood in vaginal vault. Discharge: None appreciated Upon bi manual exam- moderate TTP of the right adnexa, no cervical motion tenderness. No fullness or masses appreciated. No abnormalities  appreciated in structural anatomy.   Musculoskeletal: She exhibits no tenderness.  Neurological: She is alert and oriented to person, place, and time.  Cranial Nerves II-XII grossly intact  Skin: Skin is warm and dry. No rash noted.  Psychiatric: She has a normal mood and affect.  Nursing note and vitals reviewed.   ED Course  Procedures (including critical care time) Labs Review Labs Reviewed  WET PREP, GENITAL - Abnormal; Notable for the following:    Clue Cells Wet Prep HPF POC PRESENT (*)    WBC, Wet Prep HPF POC MODERATE (*)    All other components within normal limits  CBC WITH DIFFERENTIAL/PLATELET - Abnormal; Notable for the following:    Neutro Abs 8.3 (*)    All other components within normal limits  COMPREHENSIVE METABOLIC PANEL - Abnormal; Notable for the following:    Glucose, Bld 119 (*)    All other components within normal limits  URINALYSIS, ROUTINE W REFLEX MICROSCOPIC (NOT AT [facility]) - Abnormal; Notable for the following:    Color, Urine AMBER (*)    APPearance TURBID (*)    Bilirubin Urine SMALL (*)    Ketones, ur 15 (*)    All other components within normal limits  URINE MICROSCOPIC-ADD ON - Abnormal; Notable for the following:    Squamous Epithelial / LPF 6-30 (*)    Bacteria, UA RARE (*)    All other components within normal limits  LIPASE, BLOOD  HIV ANTIBODY (ROUTINE TESTING)  RPR  I-STAT BETA HCG BLOOD, ED ([facility], [facility], [facility] ONLY)  GC/CHLAMYDIA PROBE AMP ([place]) NOT AT [facility]    Imaging Review US Transvaginal Non-ob  06/29/2015  CLINICAL DATA:  Right-sided pelvic pain for 7 months EXAM: TRANSABDOMINAL AND TRANSVAGINAL ULTRASOUND OF PELVIS TECHNIQUE: Study was performed transabdominally to optimize pelvic field of view evaluation and transvaginally to optimize internal visceral architecture evaluation. COMPARISON:  CT abdomen and pelvis May 04, 2015 FINDINGS: Uterus Measurements: 6.6 x 3.1 x 3.9 cm. No fibroids or other mass visualized. Uterus is  anteverted. There is a nabothian cyst arising from the cervix measuring 0.8 x 0.5 x 0.4 cm. Endometrium Thickness: 5 mm.  No focal abnormality visualized. Right ovary Measurements: 3.6 x 2.3 x 2.5 cm. Normal appearance/no adnexal mass. Left ovary Measurements: 3.5 x 2.3 x 2.8 cm there is a 2.2 x 1.7 x 2.6 cm somewhat complex but solid-appearing mass lateral to the left ovary in the paraovarian region. Other findings No abnormal free fluid. IMPRESSION: Complex but solid-appearing mass just lateral to the left ovary measuring 2.2 x 1.7 x 2.6 cm. Etiology for this mass is uncertain. Differential considerations must include hemorrhagic cyst, endometrioma, small ovarian neoplasm, or possibly inflammatory lesion. Short-interval follow up ultrasound in 6-12 weeks is recommended, preferably during the week following the patient's normal menses. If there is strong concern for potential early neoplasm, pelvic MRI would be a reasonable consideration in this circumstance. Small nabothian cyst arising from cervix. Study otherwise unremarkable. Electronically Signed   By: Bretta Bang III M.D.   On: 06/29/2015 13:00   US Pelvis Complete  06/29/2015  CLINICAL DATA:  Right-sided pelvic  pain for 7 months EXAM: TRANSABDOMINAL AND TRANSVAGINAL ULTRASOUND OF PELVIS TECHNIQUE: Study was performed transabdominally to optimize pelvic field of view evaluation and transvaginally to optimize internal visceral architecture evaluation. COMPARISON:  CT abdomen and pelvis May 04, 2015 FINDINGS: Uterus Measurements: 6.6 x 3.1 x 3.9 cm. No fibroids or other mass visualized. Uterus is anteverted. There is a nabothian cyst arising from the cervix measuring 0.8 x 0.5 x 0.4 cm. Endometrium Thickness: 5 mm.  No focal abnormality visualized. Right ovary Measurements: 3.6 x 2.3 x 2.5 cm. Normal appearance/no adnexal mass. Left ovary Measurements: 3.5 x 2.3 x 2.8 cm there is a 2.2 x 1.7 x 2.6 cm somewhat complex but solid-appearing mass lateral  to the left ovary in the paraovarian region. Other findings No abnormal free fluid. IMPRESSION: Complex but solid-appearing mass just lateral to the left ovary measuring 2.2 x 1.7 x 2.6 cm. Etiology for this mass is uncertain. Differential considerations must include hemorrhagic cyst, endometrioma, small ovarian neoplasm, or possibly inflammatory lesion. Short-interval follow up ultrasound in 6-12 weeks is recommended, preferably during the week following the patient's normal menses. If there is strong concern for potential early neoplasm, pelvic MRI would be a reasonable consideration in this circumstance. Small nabothian cyst arising from cervix. Study otherwise unremarkable. Electronically Signed   By: Bretta Bang III M.D.   On: 06/29/2015 13:00   I have personally reviewed and evaluated these images and lab results as part of my medical decision-making.   EKG Interpretation None     Meds given in ED:  Medications  cefTRIAXone (ROCEPHIN) injection 250 mg (not administered)  azithromycin (ZITHROMAX) tablet 1,000 mg (not administered)  ondansetron (ZOFRAN-ODT) disintegrating tablet 8 mg (8 mg Oral Given 06/29/15 0826)  ketorolac (TORADOL) injection 60 mg (60 mg Intramuscular Given 06/29/15 0821)  fentaNYL (SUBLIMAZE) injection 50 mcg (50 mcg Intramuscular Given 06/29/15 0854)  metoCLOPramide (REGLAN) tablet 10 mg (10 mg Oral Given 06/29/15 0853)  LORazepam (ATIVAN) tablet 1 mg (1 mg Oral Given 06/29/15 0917)    New Prescriptions   DOXYCYCLINE (VIBRAMYCIN) 100 MG CAPSULE    Take 1 capsule (100 mg total) by mouth 2 (two) times daily. One po bid x 7 days   IBUPROFEN (ADVIL,MOTRIN) 600 MG TABLET    Take 1 tablet (600 mg total) by mouth every 6 (six) hours as needed.   METRONIDAZOLE (FLAGYL) 500 MG TABLET    Take 1 tablet (500 mg total) by mouth 2 (two) times daily.   Filed Vitals:   06/29/15 1045 06/29/15 1130 06/29/15 1145 06/29/15 1200  BP: 122/74 127/70 120/67 115/73  Pulse: 57 63 68 69   Temp:      TempSrc:      Resp: 15   14  Height:      Weight:      SpO2: 100% 100% 99% 99%    MDM  Abigail Gray is a 25 y.o. female with history of ovarian cysts and chronic abdominal pain comes in for evaluation of acute exacerbation of chronic abdominal pain. Patient reports relief with IM analgesia. Most recent CT scan for same symptoms completed on 05/04/15 and showed no acute intra-abdominal or pelvic findings. On arrival, patient is hemodynamically stable, afebrile and has normal vital signs. She does have mild tenderness in her right pelvis on exam. Also right adnexal tenderness on GU exam. Due to previous history of PID and current symptoms, will treat empirically for PID. Wet prep shows evidence of BV. Ultrasound shows a solid appearing mass  adjacent to left ovary with unclear etiology, recommends follow-up ultrasound in 6-12 weeks. Discussed ED course and results with the patient and she verbalizes understanding and agrees with plan and subsequent discharge. Patient reports she will follow-up with her OB/GYN. Overall, patient appears well, nontoxic, hemodynamically stable, remains afebrile and is appropriate for discharge and outpatient follow-up. Final diagnoses:  Abdominal pain in female        Joycie Peek, PA-C 06/29/15 1506  Leta Baptist, MD 07/03/15 [phone]

## 2015-06-29 NOTE — ED Notes (Signed)
EDP at bedside  

## 2015-06-29 NOTE — Discharge Instructions (Signed)
You were evaluated in the ED today for your abdominal discomfort. You were treated for pelvic inflammatory disease. You will also be treated for bacterial vaginosis with antibiotics. Take all of your antibiotics as prescribed, do not save or share them. Your ultrasound also showed a new mass on the left side of your ovaries he will need to have rechecked in the next 6-12 weeks. It is important to follow with your OB/GYN for further evaluation and management of your symptoms. Return to ED for any new or worsening symptoms as we discussed.

## 2015-06-29 NOTE — ED Notes (Signed)
Pt. Coming from home c/o worsening pelvic pain from ovarian cysts. Pt. Diagnosed with ovarian cysts over a year ago and sts the pain has not went away since. She reports worsening pain since October. Pt. Reports trying to treat her pain at home with warm compresses and zofran for nausea. Pt. Tearful at this time.

## 2015-06-30 LAB — HIV ANTIBODY (ROUTINE TESTING W REFLEX): HIV Screen 4th Generation wRfx: NONREACTIVE

## 2015-06-30 LAB — RPR: RPR: NONREACTIVE

## 2015-06-30 LAB — GC/CHLAMYDIA PROBE AMP (~~LOC~~) NOT AT ARMC
CHLAMYDIA, DNA PROBE: NEGATIVE
Neisseria Gonorrhea: NEGATIVE

## 2015-08-08 ENCOUNTER — Emergency Department (HOSPITAL_COMMUNITY)
Admission: EM | Admit: 2015-08-08 | Discharge: 2015-08-08 | Disposition: A | Discharge status: Home / Self Care | Payer: BLUE CROSS/BLUE SHIELD | Attending: Emergency Medicine | Admitting: Emergency Medicine

## 2015-08-08 ENCOUNTER — Encounter (HOSPITAL_COMMUNITY): Payer: Self-pay | Admitting: Emergency Medicine

## 2015-08-08 DIAGNOSIS — F410 Panic disorder [episodic paroxysmal anxiety] without agoraphobia: Secondary | ICD-10-CM | POA: Diagnosis not present

## 2015-08-08 DIAGNOSIS — R197 Diarrhea, unspecified: Secondary | ICD-10-CM | POA: Diagnosis not present

## 2015-08-08 DIAGNOSIS — Z3202 Encounter for pregnancy test, result negative: Secondary | ICD-10-CM | POA: Insufficient documentation

## 2015-08-08 DIAGNOSIS — R112 Nausea with vomiting, unspecified: Secondary | ICD-10-CM

## 2015-08-08 DIAGNOSIS — Z8719 Personal history of other diseases of the digestive system: Secondary | ICD-10-CM | POA: Diagnosis not present

## 2015-08-08 DIAGNOSIS — M79602 Pain in left arm: Secondary | ICD-10-CM | POA: Insufficient documentation

## 2015-08-08 DIAGNOSIS — R319 Hematuria, unspecified: Secondary | ICD-10-CM | POA: Insufficient documentation

## 2015-08-08 DIAGNOSIS — R8299 Other abnormal findings in urine: Secondary | ICD-10-CM | POA: Insufficient documentation

## 2015-08-08 DIAGNOSIS — R1010 Upper abdominal pain, unspecified: Secondary | ICD-10-CM | POA: Diagnosis not present

## 2015-08-08 DIAGNOSIS — Z87440 Personal history of urinary (tract) infections: Secondary | ICD-10-CM | POA: Insufficient documentation

## 2015-08-08 DIAGNOSIS — F17210 Nicotine dependence, cigarettes, uncomplicated: Secondary | ICD-10-CM | POA: Insufficient documentation

## 2015-08-08 LAB — URINE MICROSCOPIC-ADD ON

## 2015-08-08 LAB — COMPREHENSIVE METABOLIC PANEL
ALT: 21 U/L (ref 14–54)
AST: 23 U/L (ref 15–41)
Albumin: 4.3 g/dL (ref 3.5–5.0)
Alkaline Phosphatase: 57 U/L (ref 38–126)
Anion gap: 14 (ref 5–15)
BUN: 9 mg/dL (ref 6–20)
CHLORIDE: 103 mmol/L (ref 101–111)
CO2: 24 mmol/L (ref 22–32)
CREATININE: 0.89 mg/dL (ref 0.44–1.00)
Calcium: 9.9 mg/dL (ref 8.9–10.3)
GFR calc Af Amer: >60 mL/min (ref >60)
GFR calc non Af Amer: >60 mL/min (ref >60)
Glucose, Bld: 122 mg/dL — ABNORMAL HIGH (ref 65–99)
Potassium: 3.3 mmol/L — ABNORMAL LOW (ref 3.5–5.1)
SODIUM: 141 mmol/L (ref 135–145)
Total Bilirubin: 0.8 mg/dL (ref 0.3–1.2)
Total Protein: 7.9 g/dL (ref 6.5–8.1)

## 2015-08-08 LAB — PREGNANCY, URINE: Preg Test, Ur: NEGATIVE

## 2015-08-08 LAB — CBC
HCT: 43.9 % (ref 36.0–46.0)
Hemoglobin: 15.2 g/dL — ABNORMAL HIGH (ref 12.0–15.0)
MCH: 30.6 pg (ref 26.0–34.0)
MCHC: 34.6 g/dL (ref 30.0–36.0)
MCV: 88.3 fL (ref 78.0–100.0)
PLATELETS: 326 10*3/uL (ref 150–400)
RBC: 4.97 MIL/uL (ref 3.87–5.11)
RDW: 13.1 % (ref 11.5–15.5)
WBC: 8.4 10*3/uL (ref 4.0–10.5)

## 2015-08-08 LAB — URINALYSIS, ROUTINE W REFLEX MICROSCOPIC
GLUCOSE, UA: NEGATIVE mg/dL
Ketones, ur: NEGATIVE mg/dL
Nitrite: NEGATIVE
PROTEIN: NEGATIVE mg/dL
Specific Gravity, Urine: 1.024 (ref 1.005–1.030)
pH: 5.5 (ref 5.0–8.0)

## 2015-08-08 LAB — LIPASE, BLOOD: LIPASE: 28 U/L (ref 11–51)

## 2015-08-08 MED ORDER — ONDANSETRON 4 MG PO TBDP
4.0000 mg | ORAL_TABLET | Freq: Once | ORAL | Status: AC
  Administered 2015-08-08: 4 mg via ORAL
  Filled 2015-08-08: qty 1

## 2015-08-08 MED ORDER — ONDANSETRON 4 MG PO TBDP: 4.0000 mg | ORAL_TABLET | Freq: Three times a day (TID) | ORAL | Status: DC | PRN

## 2015-08-08 MED ORDER — KETOROLAC TROMETHAMINE 30 MG/ML IJ SOLN
60.0000 mg | Freq: Once | INTRAMUSCULAR | Status: AC
  Administered 2015-08-08: 60 mg via INTRAMUSCULAR
  Filled 2015-08-08: qty 2

## 2015-08-08 MED ORDER — LORAZEPAM 2 MG/ML IJ SOLN
1.0000 mg | Freq: Once | INTRAMUSCULAR | Status: AC
  Administered 2015-08-08: 1 mg via INTRAMUSCULAR
  Filled 2015-08-08: qty 1

## 2015-08-08 MED ORDER — LORAZEPAM 1 MG PO TABS
1.0000 mg | ORAL_TABLET | Freq: Once | ORAL | Status: AC
  Administered 2015-08-08: 1 mg via ORAL
  Filled 2015-08-08: qty 1

## 2015-08-08 MED ORDER — IBUPROFEN 800 MG PO TABS: 800.0000 mg | ORAL_TABLET | Freq: Three times a day (TID) | ORAL | Status: DC | PRN

## 2015-08-08 MED ORDER — LORAZEPAM 1 MG PO TABS: 1.0000 mg | ORAL_TABLET | Freq: Three times a day (TID) | ORAL | Status: DC | PRN

## 2015-08-08 NOTE — ED Notes (Signed)
Patient here with complaint of left arm pain and upper abdominal pain. States onset of both as Thursday of this week. Patient states arm pain is localized where she has a birth control implant. Abdominal pain has been accompanied by vomiting.

## 2015-08-08 NOTE — ED Provider Notes (Signed)
TIME SEEN: 4:17 AM   CHIEF COMPLAINT:  Chief Complaint  Patient presents with  . Abdominal Pain  . Arm Pain     HPI:  HPI Comments: Abigail Gray is a 25 y.o. female with a PMHx of anxiety who presents to the Emergency Department complaining of constant, ongoing L arm pain x 1 day. Pt states pain is located where her Nexplanon was implanted in October 2015. No aggravating or alleviating factors at this time. Pt also reports ongoing upper abdominal pain with associated nausea, vomiting, and diarrhea x 2 days. No OTC medications attempted prior to arrival. No recent fever, chills, chest pain, or shortness of breath. No prior history of blood clots. No recent surgeries. She denies any long distance travel. Pt is a former smoker and recently quit 2 weeks ago. No swelling of the arm. No injury to the arm. States she feels very anxious because she wants her Nexplanon removed tonight.  PCP: PROVIDER NOT IN SYSTEM   OB/GYN: [facility]  ROS: See HPI Constitutional: no fever  Eyes: no drainage  ENT: no runny nose   Cardiovascular:  no chest pain  Resp: no SOB  GI: Positive vomiting, nausea, diarrhea, and abdominal pain GU: no dysuria Integumentary: no rash  Allergy: no hives  Musculoskeletal: no leg swelling. Positive arthralgias  Neurological: no slurred speech ROS otherwise negative  PAST MEDICAL HISTORY/PAST SURGICAL HISTORY:  Past Medical History  Diagnosis Date  . Hemorrhoids   . UTI (lower urinary tract infection)     MEDICATIONS:  Prior to Admission medications   Medication Sig Start Date End Date Taking? Authorizing Provider  etonogestrel (NEXPLANON) 68 MG IMPL implant 1 each by Subdermal route once. Implanted October 2015   Yes Historical Provider, MD  ibuprofen (ADVIL,MOTRIN) 200 MG tablet Take 400 mg by mouth every 6 (six) hours as needed for headache.   Yes Historical Provider, MD  doxycycline (VIBRAMYCIN) 100 MG capsule Take 1 capsule (100 mg total) by mouth 2 (two)  times daily. One po bid x 7 days Patient not taking: Reported on 08/08/2015 06/29/15   Joycie Peek, PA-C  ibuprofen (ADVIL,MOTRIN) 600 MG tablet Take 1 tablet (600 mg total) by mouth every 6 (six) hours as needed. Patient not taking: Reported on 08/08/2015 06/29/15   Joycie Peek, PA-C  metroNIDAZOLE (FLAGYL) 500 MG tablet Take 1 tablet (500 mg total) by mouth 2 (two) times daily. Patient not taking: Reported on 08/08/2015 06/29/15   Joycie Peek, PA-C    ALLERGIES:  Allergies  Allergen Reactions  . Doxycycline Other (See Comments)    Caused yeast infection   . Shellfish Allergy Hives and Diarrhea    SOCIAL HISTORY:  Social History  Substance Use Topics  . Smoking status: Current Every Day Smoker -- 0.50 packs/day    Types: Cigarettes  . Smokeless tobacco: Never Used  . Alcohol Use: No    FAMILY HISTORY: History reviewed. No pertinent family history.  EXAM: BP 130/84 mmHg  Pulse 98  Temp(Src) 97.7 F (36.5 C) (Oral)  Ht  (1.6 m)  Wt 180 lb (81.647 kg)  BMI 31.89 kg/m2  SpO2 98%  LMP 07/25/2015 (Approximate) CONSTITUTIONAL: Alert and oriented and responds appropriately to questions. Well-appearing; well-nourished. Tearful and anxious  HEAD: Normocephalic EYES: Conjunctivae clear, PERRL ENT: normal nose; no rhinorrhea; moist mucous membranes; pharynx without lesions noted NECK: Supple, no meningismus, no LAD  CARD: RRR; S1 and S2 appreciated; no murmurs, no clicks, no rubs, no gallops RESP: Normal chest excursion  without splinting or tachypnea; breath sounds clear and equal bilaterally; no wheezes, no rhonchi, no rales, no hypoxia or respiratory distress, speaking full sentences ABD/GI: Normal bowel sounds; non-distended; soft, non-tender, no rebound, no guarding, no peritoneal signs BACK:  The back appears normal and is non-tender to palpation, there is no CVA tenderness EXT: Normal ROM in all joints; non-tender to palpation; no edema; normal capillary refill;  no cyanosis, no calf tenderness or swelling . Able to palpate pts birth control in L inner arm without any warmth, ecchymosis, erythema, or fluctuance. No bony tenderness or bony deformity . 2 plus L radial pulse and normal grip strength SKIN: Normal color for age and race; warm NEURO: Moves all extremities equally, sensation to light touch intact diffusely, cranial nerves II through XII intact PSYCH: Anxious, tearful. Grooming and personal hygiene are appropriate.  MEDICAL DECISION MAKING: Patient here with anxiety attack from concerns from her birth control implant in her left arm. There is no sign of superimposed infection. No history of injury to this arm. No signs or symptoms to suggest DVT. She is tender to palpation over the implant and is requesting that removing it today. Have expanded patient that this cannot be done from the emergency department and she should follow-up with her OB/GYN. She also reports crampy upper abdominal pain, nausea, vomiting and diarrhea. Abdominal exam is benign. Labs ordered in triage are unremarkable. Urine pending. We'll give Toradol for pain, Zofran for nausea and Ativan for her anxiety.  ED PROGRESS: Patient's urine shows small hemoglobin, trace leukocytes, few bacteria but also multiple squamous cells. She is not having any urinary symptoms. No dysuria, hematuria, vaginal bleeding or discharge. Suspect this is a dirty catch. Pregnancy test is negative. Patient reports improvement in pain and nausea and is able to drink and eat without difficulty but is still very anxious and now complaining of tightness in her chest which she has with her panic attacks. EKG shows no new ischemic changes. Given IM Ativan with good relief. We'll discharge with prescriptions for Ativan, ibuprofen, Zofran. Have strongly advised her to follow up with her primary care physician. She denies SI, HI or hallucinations. Do not feel or any acute psychiatric safety concerns. Discussed return  precautions. She verbalized understanding and is comfortable with this plan.      EKG Interpretation  Date/Time:  Sunday August 08 2015 05:38:24 EST Ventricular Rate:  74 PR Interval:  155 QRS Duration: 88 QT Interval:  365 QTC Calculation: 405 R Axis:   53 Text Interpretation:  Sinus arrhythmia Abnormal Q suggests anterior infarct No significant change since last tracing other than rate is slower Confirmed by WARD,  DO, KRISTEN ([id number]) on 08/08/2015 5:45:20 AM        I personally performed the services described in this documentation, which was scribed in my presence. The recorded information has been reviewed and is accurate.   Layla Maw Ward, DO 08/08/15 [phone]

## 2015-08-08 NOTE — ED Notes (Signed)
Pt left at this time with all belongings.  

## 2015-08-08 NOTE — Discharge Instructions (Signed)
Nausea and Vomiting °Nausea is a sick feeling that often comes before throwing up (vomiting). Vomiting is a reflex where stomach contents come out of your mouth. Vomiting can cause severe loss of body fluids (dehydration). Children and elderly adults can become dehydrated quickly, especially if they also have diarrhea. Nausea and vomiting are symptoms of a condition or disease. It is important to find the cause of your symptoms. °CAUSES  °· Direct irritation of the stomach lining. This irritation can result from increased acid production (gastroesophageal reflux disease), infection, food poisoning, taking certain medicines (such as nonsteroidal anti-inflammatory drugs), alcohol use, or tobacco use. °· Signals from the brain. These signals could be caused by a headache, heat exposure, an inner ear disturbance, increased pressure in the brain from injury, infection, a tumor, or a concussion, pain, emotional stimulus, or metabolic problems. °· An obstruction in the gastrointestinal tract (bowel obstruction). °· Illnesses such as diabetes, hepatitis, gallbladder problems, appendicitis, kidney problems, cancer, sepsis, atypical symptoms of a heart attack, or eating disorders. °· Medical treatments such as chemotherapy and radiation. °· Receiving medicine that makes you sleep (general anesthetic) during surgery. °DIAGNOSIS °Your caregiver may ask for tests to be done if the problems do not improve after a few days. Tests may also be done if symptoms are severe or if the reason for the nausea and vomiting is not clear. Tests may include: °· Urine tests. °· Blood tests. °· Stool tests. °· Cultures (to look for evidence of infection). °· X-rays or other imaging studies. °Test results can help your caregiver make decisions about treatment or the need for additional tests. °TREATMENT °You need to stay well hydrated. Drink frequently but in small amounts. You may wish to drink water, sports drinks, clear broth, or eat frozen  ice pops or gelatin dessert to help stay hydrated. When you eat, eating slowly may help prevent nausea. There are also some antinausea medicines that may help prevent nausea. °HOME CARE INSTRUCTIONS  °· Take all medicine as directed by your caregiver. °· If you do not have an appetite, do not force yourself to eat. However, you must continue to drink fluids. °· If you have an appetite, eat a normal diet unless your caregiver tells you differently. °· Eat a variety of complex carbohydrates (rice, wheat, potatoes, bread), lean meats, yogurt, fruits, and vegetables. °· Avoid high-fat foods because they are more difficult to digest. °· Drink enough water and fluids to keep your urine clear or pale yellow. °· If you are dehydrated, ask your caregiver for specific rehydration instructions. Signs of dehydration may include: °· Severe thirst. °· Dry lips and mouth. °· Dizziness. °· Dark urine. °· Decreasing urine frequency and amount. °· Confusion. °· Rapid breathing or pulse. °SEEK IMMEDIATE MEDICAL CARE IF:  °· You have blood or Jacob flecks (like coffee grounds) in your vomit. °· You have black or bloody stools. °· You have a severe headache or stiff neck. °· You are confused. °· You have severe abdominal pain. °· You have chest pain or trouble breathing. °· You do not urinate at least once every 8 hours. °· You develop cold or clammy skin. °· You continue to vomit for longer than 24 to 48 hours. °· You have a fever. °MAKE SURE YOU:  °· Understand these instructions. °· Will watch your condition. °· Will get help right away if you are not doing well or get worse. °  °This information is not intended to replace advice given to you by your health care provider. Make sure   you discuss any questions you have with your health care provider.   Document Released: 06/12/2005 Document Revised: 09/04/2011 Document Reviewed: 11/09/2010 Elsevier Interactive Patient Education 2016 [employer].  Panic Attacks Panic attacks are  sudden, short-livedsurges of severe anxiety, fear, or discomfort. They may occur for no reason when you are relaxed, when you are anxious, or when you are sleeping. Panic attacks may occur for a number of reasons:   Healthy people occasionally have panic attacks in extreme, life-threatening situations, such as war or natural disasters. Normal anxiety is a protective mechanism of the body that helps [place] react to danger (fight or flight response).  Panic attacks are often seen with anxiety disorders, such as panic disorder, social anxiety disorder, generalized anxiety disorder, and phobias. Anxiety disorders cause excessive or uncontrollable anxiety. They may interfere with your relationships or other life activities.  Panic attacks are sometimes seen with other mental illnesses, such as depression and posttraumatic stress disorder.  Certain medical conditions, prescription medicines, and drugs of abuse can cause panic attacks. SYMPTOMS  Panic attacks start suddenly, peak within 20 minutes, and are accompanied by four or more of the following symptoms:  Pounding heart or fast heart rate (palpitations).  Sweating.  Trembling or shaking.  Shortness of breath or feeling smothered.  Feeling choked.  Chest pain or discomfort.  Nausea or strange feeling in your stomach.  Dizziness, light-headedness, or feeling like you will faint.  Chills or hot flushes.  Numbness or tingling in your lips or hands and feet.  Feeling that things are not real or feeling that you are not yourself.  Fear of losing control or going crazy.  Fear of dying. Some of these symptoms can mimic serious medical conditions. For example, you may think you are having a heart attack. Although panic attacks can be very scary, they are not life threatening. DIAGNOSIS  Panic attacks are diagnosed through an assessment by your health care provider. Your health care provider will ask questions about your symptoms, such as  where and when they occurred. Your health care provider will also ask about your medical history and use of alcohol and drugs, including prescription medicines. Your health care provider may order blood tests or other studies to rule out a serious medical condition. Your health care provider may refer you to a mental health professional for further evaluation. TREATMENT   Most healthy people who have one or two panic attacks in an extreme, life-threatening situation will not require treatment.  The treatment for panic attacks associated with anxiety disorders or other mental illness typically involves counseling with a mental health professional, medicine, or a combination of both. Your health care provider will help determine what treatment is best for you.  Panic attacks due to physical illness usually go away with treatment of the illness. If prescription medicine is causing panic attacks, talk with your health care provider about stopping the medicine, decreasing the dose, or substituting another medicine.  Panic attacks due to alcohol or drug abuse go away with abstinence. Some adults need professional help in order to stop drinking or using drugs. HOME CARE INSTRUCTIONS   Take all medicines as directed by your health care provider.   Schedule and attend follow-up visits as directed by your health care provider. It is important to keep all your appointments. SEEK MEDICAL CARE IF:  You are not able to take your medicines as prescribed.  Your symptoms do not improve or get worse. SEEK IMMEDIATE MEDICAL CARE IF:  You experience panic attack symptoms that are different than your usual symptoms.  You have serious thoughts about hurting yourself or others.  You are taking medicine for panic attacks and have a serious side effect. MAKE SURE YOU:  Understand these instructions.  Will watch your condition.  Will get help right away if you are not doing well or get worse.   This  information is not intended to replace advice given to you by your health care provider. Make sure you discuss any questions you have with your health care provider.   Document Released: 06/12/2005 Document Revised: 06/17/2013 Document Reviewed: 01/24/2013 Elsevier Interactive Patient Education 2016 [employer].     [facility] [email or web address] [address] [place], [place] [phone]    [facility] [email or web address] [address] [place], [place] [phone]    [facility] [address] [place], [place] [phone]   [email or web address] [address] [place], [place] [phone]   [facility] [email or web address] [address] [place], [place] [phone]   [facility] [email or web address] [address] [place], [place] [phone]

## 2015-09-22 ENCOUNTER — Emergency Department (HOSPITAL_COMMUNITY)
Admission: EM | Admit: 2015-09-22 | Discharge: 2015-09-22 | Disposition: A | Discharge status: Home / Self Care | Payer: BLUE CROSS/BLUE SHIELD | Attending: Emergency Medicine | Admitting: Emergency Medicine

## 2015-09-22 ENCOUNTER — Encounter (HOSPITAL_COMMUNITY): Payer: Self-pay | Admitting: Emergency Medicine

## 2015-09-22 DIAGNOSIS — R0982 Postnasal drip: Secondary | ICD-10-CM | POA: Insufficient documentation

## 2015-09-22 DIAGNOSIS — Z8719 Personal history of other diseases of the digestive system: Secondary | ICD-10-CM | POA: Insufficient documentation

## 2015-09-22 DIAGNOSIS — R52 Pain, unspecified: Secondary | ICD-10-CM | POA: Insufficient documentation

## 2015-09-22 DIAGNOSIS — J3489 Other specified disorders of nose and nasal sinuses: Secondary | ICD-10-CM | POA: Insufficient documentation

## 2015-09-22 DIAGNOSIS — R067 Sneezing: Secondary | ICD-10-CM | POA: Insufficient documentation

## 2015-09-22 DIAGNOSIS — Z87440 Personal history of urinary (tract) infections: Secondary | ICD-10-CM | POA: Insufficient documentation

## 2015-09-22 DIAGNOSIS — R05 Cough: Secondary | ICD-10-CM | POA: Insufficient documentation

## 2015-09-22 DIAGNOSIS — R6889 Other general symptoms and signs: Secondary | ICD-10-CM

## 2015-09-22 DIAGNOSIS — J029 Acute pharyngitis, unspecified: Secondary | ICD-10-CM | POA: Insufficient documentation

## 2015-09-22 DIAGNOSIS — Z87891 Personal history of nicotine dependence: Secondary | ICD-10-CM | POA: Insufficient documentation

## 2015-09-22 DIAGNOSIS — R112 Nausea with vomiting, unspecified: Secondary | ICD-10-CM | POA: Insufficient documentation

## 2015-09-22 DIAGNOSIS — R197 Diarrhea, unspecified: Secondary | ICD-10-CM | POA: Insufficient documentation

## 2015-09-22 LAB — COMPREHENSIVE METABOLIC PANEL
ALT: 25 U/L (ref 14–54)
ANION GAP: 11 (ref 5–15)
AST: 25 U/L (ref 15–41)
Albumin: 4.3 g/dL (ref 3.5–5.0)
Alkaline Phosphatase: 59 U/L (ref 38–126)
BUN: 10 mg/dL (ref 6–20)
CALCIUM: 9.5 mg/dL (ref 8.9–10.3)
CO2: 18 mmol/L — ABNORMAL LOW (ref 22–32)
Chloride: 109 mmol/L (ref 101–111)
Creatinine, Ser: 0.75 mg/dL (ref 0.44–1.00)
Glucose, Bld: 109 mg/dL — ABNORMAL HIGH (ref 65–99)
Potassium: 3.9 mmol/L (ref 3.5–5.1)
SODIUM: 138 mmol/L (ref 135–145)
TOTAL PROTEIN: 7.7 g/dL (ref 6.5–8.1)
Total Bilirubin: 0.7 mg/dL (ref 0.3–1.2)

## 2015-09-22 LAB — LIPASE, BLOOD: LIPASE: 23 U/L (ref 11–51)

## 2015-09-22 LAB — CBC
HCT: 42.3 % (ref 36.0–46.0)
HEMOGLOBIN: 15.2 g/dL — AB (ref 12.0–15.0)
MCH: 31.6 pg (ref 26.0–34.0)
MCHC: 35.9 g/dL (ref 30.0–36.0)
MCV: 87.9 fL (ref 78.0–100.0)
Platelets: 287 10*3/uL (ref 150–400)
RBC: 4.81 MIL/uL (ref 3.87–5.11)
RDW: 13.2 % (ref 11.5–15.5)
WBC: 9.4 10*3/uL (ref 4.0–10.5)

## 2015-09-22 MED ORDER — LORAZEPAM 2 MG/ML IJ SOLN
1.0000 mg | Freq: Once | INTRAMUSCULAR | Status: AC
  Administered 2015-09-22: 1 mg via INTRAVENOUS
  Filled 2015-09-22: qty 1

## 2015-09-22 MED ORDER — ONDANSETRON HCL 4 MG/2ML IJ SOLN
4.0000 mg | Freq: Once | INTRAMUSCULAR | Status: AC
  Administered 2015-09-22: 4 mg via INTRAVENOUS
  Filled 2015-09-22: qty 2

## 2015-09-22 MED ORDER — MORPHINE SULFATE (PF) 4 MG/ML IV SOLN
4.0000 mg | Freq: Once | INTRAVENOUS | Status: AC
  Administered 2015-09-22: 4 mg via INTRAVENOUS
  Filled 2015-09-22: qty 1

## 2015-09-22 MED ORDER — ONDANSETRON HCL 4 MG PO TABS: 4.0000 mg | ORAL_TABLET | Freq: Four times a day (QID) | ORAL | Status: DC

## 2015-09-22 MED ORDER — HYDROCODONE-HOMATROPINE 5-1.5 MG/5ML PO SYRP: 5.0000 mL | ORAL_SOLUTION | Freq: Four times a day (QID) | ORAL | Status: DC | PRN

## 2015-09-22 MED ORDER — SODIUM CHLORIDE 0.9 % IV BOLUS (SEPSIS)
1000.0000 mL | Freq: Once | INTRAVENOUS | Status: AC
  Administered 2015-09-22: 1000 mL via INTRAVENOUS

## 2015-09-22 NOTE — ED Notes (Signed)
Pt reports generalized body aches with nausea and vomiting x 2 days. Pt alert x4. NAD at this time.

## 2015-09-22 NOTE — Discharge Instructions (Signed)

## 2015-09-22 NOTE — ED Provider Notes (Signed)
CSN: [id number]     Arrival date & time 09/22/15  1036 History   First MD Initiated Contact with Patient 09/22/15 1229     Chief Complaint  Patient presents with  . Generalized Body Aches     (Consider location/radiation/quality/duration/timing/severity/associated sxs/prior Treatment) HPI Comments: Patient presents to the emergency department with chief complaint of generalized body aches, nausea, and vomiting 2 days. She denies getting a flu shot. She has not measured her temperature. She reports having a nonproductive cough. She has tried taking ibuprofen with no relief of her symptoms. There are no modifying factors. She denies any other medical problems.  The history is provided by the patient. No language interpreter was used.    Past Medical History  Diagnosis Date  . Hemorrhoids   . UTI (lower urinary tract infection)    History reviewed. No pertinent past surgical history. No family history on file. Social History  Substance Use Topics  . Smoking status: Former Smoker -- 0.50 packs/day    Types: Cigarettes  . Smokeless tobacco: Never Used  . Alcohol Use: No   OB History    No data available     Review of Systems  Constitutional: Negative for fever and chills.  HENT: Positive for postnasal drip, rhinorrhea, sinus pressure, sneezing and sore throat.   Respiratory: Positive for cough. Negative for shortness of breath.   Cardiovascular: Negative for chest pain.  Gastrointestinal: Positive for nausea, vomiting and diarrhea. Negative for abdominal pain and constipation.  Genitourinary: Negative for dysuria.  All other systems reviewed and are negative.     Allergies  Doxycycline and Shellfish allergy  Home Medications   Prior to Admission medications   Medication Sig Start Date End Date Taking? Authorizing Provider  etonogestrel (NEXPLANON) 68 MG IMPL implant 1 each by Subdermal route once. Implanted October 2015    Historical Provider, MD  ibuprofen  (ADVIL,MOTRIN) 200 MG tablet Take 400 mg by mouth every 6 (six) hours as needed for headache.    Historical Provider, MD  ibuprofen (ADVIL,MOTRIN) 800 MG tablet Take 1 tablet (800 mg total) by mouth every 8 (eight) hours as needed for mild pain. 08/08/15   Kristen N Ward, DO  LORazepam (ATIVAN) 1 MG tablet Take 1 tablet (1 mg total) by mouth 3 (three) times daily as needed for anxiety. 08/08/15   Kristen N Ward, DO  ondansetron (ZOFRAN ODT) 4 MG disintegrating tablet Take 1 tablet (4 mg total) by mouth every 8 (eight) hours as needed for nausea or vomiting. 08/08/15   Kristen N Ward, DO   BP 136/105 mmHg  Pulse 108  Temp(Src) 98.1 F (36.7 C) (Oral)  Resp 18  SpO2 99% Physical Exam  Constitutional: She appears well-developed and well-nourished. No distress.  HENT:  Head: Normocephalic.  Right Ear: External ear normal.  Left Ear: External ear normal.  Mildly erythematous, no tonsillar exudate, no abscess, no stridor, uvula is midline  TMs clear bilaterally  Eyes: Conjunctivae and EOM are normal. Pupils are equal, round, and reactive to light.  Neck: Normal range of motion. Neck supple.  Cardiovascular: Normal rate, regular rhythm and normal heart sounds.  Exam reveals no gallop and no friction rub.   No murmur heard. Pulmonary/Chest: Effort normal and breath sounds normal. No stridor. No respiratory distress. She has no wheezes. She has no rales. She exhibits no tenderness.  CTAB  Abdominal: Soft. Bowel sounds are normal. She exhibits no distension. There is no tenderness.  Musculoskeletal: Normal range of motion. She exhibits  no tenderness.  Neurological: She is alert.  Skin: Skin is warm and dry. No rash noted. She is not diaphoretic.  Psychiatric: She has a normal mood and affect. Her behavior is normal. Judgment and thought content normal.  Nursing note and vitals reviewed.   ED Course  Procedures (including critical care time) Results for orders placed or performed during the  hospital encounter of 09/22/15  Lipase, blood  Result Value Ref Range   Lipase 23 11 - 51 U/L  Comprehensive metabolic panel  Result Value Ref Range   Sodium 138 135 - 145 mmol/L   Potassium 3.9 3.5 - 5.1 mmol/L   Chloride 109 101 - 111 mmol/L   CO2 18 (L) 22 - 32 mmol/L   Glucose, Bld 109 (H) 65 - 99 mg/dL   BUN 10 6 - 20 mg/dL   Creatinine, Ser 1.610.75 0.44 - 1.00 mg/dL   Calcium 9.5 8.9 - 09.610.3 mg/dL   Total Protein 7.7 6.5 - 8.1 g/dL   Albumin 4.3 3.5 - 5.0 g/dL   AST 25 15 - 41 U/L   ALT 25 14 - 54 U/L   Alkaline Phosphatase 59 38 - 126 U/L   Total Bilirubin 0.7 0.3 - 1.2 mg/dL   GFR calc non Af Amer >60 >60 mL/min   GFR calc Af Amer >60 >60 mL/min   Anion gap 11 5 - 15  CBC  Result Value Ref Range   WBC 9.4 4.0 - 10.5 K/uL   RBC 4.81 3.87 - 5.11 MIL/uL   Hemoglobin 15.2 (H) 12.0 - 15.0 g/dL   HCT 04.542.3 40.936.0 - 81.146.0 %   MCV 87.9 78.0 - 100.0 fL   MCH 31.6 26.0 - 34.0 pg   MCHC 35.9 30.0 - 36.0 g/dL   RDW 91.413.2 78.211.5 - 95.615.5 %   Platelets 287 150 - 400 K/uL   No results found.  I have personally reviewed and evaluated these images and lab results as part of my medical decision-making.   EKG Interpretation None      MDM   Final diagnoses:  Flu-like symptoms    Patient with symptoms consistent with influenza.  Vitals are stable, low-grade fever.  No signs of dehydration, tolerating PO's.  Lungs are clear. Due to patient's presentation and physical exam a chest x-ray was not ordered bc likely diagnosis of flu.   Patient will be discharged with instructions to orally hydrate, rest, and use over-the-counter medications such as anti-inflammatories ibuprofen and Aleve for muscle aches and Tylenol for fever.  Patient will also be given a cough suppressant.      Roxy Horsemanobert Faelyn Sigler, PA-C 09/22/15 1557  Rolland PorterMark James, MD 09/25/15 1019

## 2016-02-05 ENCOUNTER — Emergency Department (HOSPITAL_COMMUNITY)
Admission: EM | Admit: 2016-02-05 | Discharge: 2016-02-05 | Disposition: A | Discharge status: Home / Self Care | Payer: BLUE CROSS/BLUE SHIELD | Attending: Emergency Medicine | Admitting: Emergency Medicine

## 2016-02-05 ENCOUNTER — Encounter (HOSPITAL_COMMUNITY): Payer: Self-pay | Admitting: Emergency Medicine

## 2016-02-05 DIAGNOSIS — R112 Nausea with vomiting, unspecified: Secondary | ICD-10-CM | POA: Insufficient documentation

## 2016-02-05 DIAGNOSIS — Z87891 Personal history of nicotine dependence: Secondary | ICD-10-CM | POA: Insufficient documentation

## 2016-02-05 DIAGNOSIS — Z7982 Long term (current) use of aspirin: Secondary | ICD-10-CM | POA: Diagnosis not present

## 2016-02-05 DIAGNOSIS — R197 Diarrhea, unspecified: Secondary | ICD-10-CM | POA: Insufficient documentation

## 2016-02-05 LAB — CBC WITH DIFFERENTIAL/PLATELET
BASOS PCT: 0 %
Basophils Absolute: 0 10*3/uL (ref 0.0–0.1)
EOS ABS: 0 10*3/uL (ref 0.0–0.7)
EOS PCT: 0 %
HCT: 41.1 % (ref 36.0–46.0)
HEMOGLOBIN: 14.2 g/dL (ref 12.0–15.0)
LYMPHS ABS: 1.5 10*3/uL (ref 0.7–4.0)
Lymphocytes Relative: 14 %
MCH: 30.9 pg (ref 26.0–34.0)
MCHC: 34.5 g/dL (ref 30.0–36.0)
MCV: 89.5 fL (ref 78.0–100.0)
MONO ABS: 0.6 10*3/uL (ref 0.1–1.0)
MONOS PCT: 6 %
NEUTROS PCT: 80 %
Neutro Abs: 8.5 10*3/uL — ABNORMAL HIGH (ref 1.7–7.7)
PLATELETS: 293 10*3/uL (ref 150–400)
RBC: 4.59 MIL/uL (ref 3.87–5.11)
RDW: 13 % (ref 11.5–15.5)
WBC: 10.6 10*3/uL — ABNORMAL HIGH (ref 4.0–10.5)

## 2016-02-05 LAB — COMPREHENSIVE METABOLIC PANEL
ALBUMIN: 4.1 g/dL (ref 3.5–5.0)
ALK PHOS: 52 U/L (ref 38–126)
ALT: 15 U/L (ref 14–54)
ANION GAP: 11 (ref 5–15)
AST: 22 U/L (ref 15–41)
BUN: 12 mg/dL (ref 6–20)
CALCIUM: 9.1 mg/dL (ref 8.9–10.3)
CO2: 21 mmol/L — AB (ref 22–32)
Chloride: 105 mmol/L (ref 101–111)
Creatinine, Ser: 0.66 mg/dL (ref 0.44–1.00)
GFR calc non Af Amer: >60 mL/min (ref >60)
GLUCOSE: 118 mg/dL — AB (ref 65–99)
POTASSIUM: 3.5 mmol/L (ref 3.5–5.1)
SODIUM: 137 mmol/L (ref 135–145)
TOTAL PROTEIN: 7.4 g/dL (ref 6.5–8.1)
Total Bilirubin: 0.4 mg/dL (ref 0.3–1.2)

## 2016-02-05 LAB — I-STAT BETA HCG BLOOD, ED (MC, WL, AP ONLY): I-stat hCG, quantitative: <5 m[IU]/mL (ref <5)

## 2016-02-05 LAB — URINALYSIS, ROUTINE W REFLEX MICROSCOPIC
Bilirubin Urine: NEGATIVE
Glucose, UA: NEGATIVE mg/dL
Hgb urine dipstick: NEGATIVE
KETONES UR: NEGATIVE mg/dL
LEUKOCYTES UA: NEGATIVE
Nitrite: NEGATIVE
PH: 7 (ref 5.0–8.0)
PROTEIN: NEGATIVE mg/dL
Specific Gravity, Urine: 1.022 (ref 1.005–1.030)

## 2016-02-05 LAB — LIPASE, BLOOD: LIPASE: 24 U/L (ref 11–51)

## 2016-02-05 MED ORDER — SODIUM CHLORIDE 0.9 % IV BOLUS (SEPSIS)
1000.0000 mL | Freq: Once | INTRAVENOUS | Status: AC
  Administered 2016-02-05: 1000 mL via INTRAVENOUS

## 2016-02-05 MED ORDER — ONDANSETRON HCL 4 MG/2ML IJ SOLN
4.0000 mg | Freq: Once | INTRAMUSCULAR | Status: AC | PRN
  Administered 2016-02-05: 4 mg via INTRAVENOUS
  Filled 2016-02-05: qty 2

## 2016-02-05 NOTE — ED Notes (Signed)
Pt ambulated to and from bathroom; tolerated well

## 2016-02-05 NOTE — ED Triage Notes (Signed)
Pt in from home with c/o n/v/d since 0300 this morning. Pt has vomited x 20, diarrhea x5. No fevers, no abd pain reported. Pt states she had hx of this before with an ovarian cyst. Pt took Zofran PTA with no relief. Pt last ate at 1600 yesterday.

## 2016-02-05 NOTE — ED Notes (Signed)
Pt tolerated fluids 

## 2016-02-05 NOTE — ED Notes (Signed)
Provided pt with water to drink per Efraim KaufmannMelissa, RN

## 2016-02-05 NOTE — ED Provider Notes (Signed)
[facility]-EMERGENCY DEPT Provider Note   CSN: [id number] Arrival date & time: 02/05/16  1191  First Provider Contact:  First MD Initiated Contact with Patient 02/05/16 [phone]        History   Chief Complaint Chief Complaint  Patient presents with  . Nausea  . Emesis    HPI Abigail Gray is a 25 y.o. female.  HPI   Abigail Gray is a 25 y.o. female with PMH significant for ovarian cysts and hemorrhoids who presents with sudden onset, constant, worsening N/V/D since 3 AM this morning.  No abdominal pain, urinary symptoms, vaginal symptoms, fever, chills, CP, SOB, hematemesis, melena, or hematochezia.  She has tried Zofran, but vomited immediately after.  She states this is causing her anxiety to elevate. No aggravating factors.  Denies recent travel, abx use, sick contacts, suspicious foods.      Past Medical History:  Diagnosis Date  . Hemorrhoids   . UTI (lower urinary tract infection)     There are no active problems to display for this patient.   History reviewed. No pertinent surgical history.  OB History    No data available       Home Medications    Prior to Admission medications   Medication Sig Start Date End Date Taking? Authorizing Provider  Aspirin-Salicylamide-Caffeine (BC HEADACHE POWDER PO) Take 1 each by mouth every 6 (six) hours as needed (pain).   Yes Historical Provider, MD  ibuprofen (ADVIL,MOTRIN) 800 MG tablet Take 1 tablet (800 mg total) by mouth every 8 (eight) hours as needed for mild pain. 08/08/15  Yes Kristen N Ward, DO  LORazepam (ATIVAN) 1 MG tablet Take 1 tablet (1 mg total) by mouth 3 (three) times daily as needed for anxiety. 08/08/15  Yes Kristen N Ward, DO  etonogestrel (NEXPLANON) 68 MG IMPL implant 1 each by Subdermal route once. Implanted October 2015    Historical Provider, MD    Family History No family history on file.  Social History Social History  Substance Use Topics  . Smoking status: Former Smoker    Packs/day: 0.50      Types: Cigarettes  . Smokeless tobacco: Never Used  . Alcohol use No     Allergies   Doxycycline and Shellfish allergy   Review of Systems Review of Systems All other systems negative unless otherwise stated in HPI   Physical Exam Updated Vital Signs BP 123/77   Pulse 67   Temp 98.3 F (36.8 C) (Oral)   Resp 16   Ht  (1.626 m)   Wt 81.6 kg   SpO2 96%   BMI 30.90 kg/m   Physical Exam  Constitutional: She is oriented to person, place, and time. She appears well-developed and well-nourished.  Non-toxic appearance. She does not have a sickly appearance. She does not appear ill.  Patient is tearful holding emesis bag.   HENT:  Head: Normocephalic and atraumatic.  Mouth/Throat: Oropharynx is clear and moist.  Eyes: Conjunctivae are normal. Pupils are equal, round, and reactive to light.  Neck: Normal range of motion. Neck supple.  Cardiovascular: Normal rate, regular rhythm and normal heart sounds.   Pulmonary/Chest: Effort normal and breath sounds normal. No accessory muscle usage or stridor. No respiratory distress. She has no wheezes. She has no rhonchi. She has no rales.  Abdominal: Soft. Bowel sounds are normal. She exhibits no distension. There is no tenderness. There is no rebound and no guarding.  Musculoskeletal: Normal range of motion.  Lymphadenopathy:  She has no cervical adenopathy.  Neurological: She is alert and oriented to person, place, and time.  Speech clear without dysarthria.  Skin: Skin is warm and dry.  Psychiatric: She has a normal mood and affect. Her behavior is normal.     ED Treatments / Results  Labs (all labs ordered are listed, but only abnormal results are displayed) Labs Reviewed  CBC WITH DIFFERENTIAL/PLATELET - Abnormal; Notable for the following:       Result Value   WBC 10.6 (*)    Neutro Abs 8.5 (*)    All other components within normal limits  COMPREHENSIVE METABOLIC PANEL - Abnormal; Notable for the following:     CO2 21 (*)    Glucose, Bld 118 (*)    All other components within normal limits  URINALYSIS, ROUTINE W REFLEX MICROSCOPIC (NOT [facility]) - Abnormal; Notable for the following:    APPearance HAZY (*)    All other components within normal limits  LIPASE, BLOOD  I-STAT BETA HCG BLOOD, ED ([facility], [facility], [facility] ONLY)    EKG  EKG Interpretation None       Radiology No results found.  Procedures Procedures (including critical care time)  Medications Ordered in ED Medications  ondansetron (ZOFRAN) injection 4 mg (4 mg Intravenous Given 02/05/16 0936)  sodium chloride 0.9 % bolus 1,000 mL (0 mLs Intravenous Stopped 02/05/16 1100)     Initial Impression / Assessment and Plan / ED Course  I have reviewed the triage vital signs and the nursing notes.  Pertinent labs & imaging results that were available during my care of the patient were reviewed by me and considered in my medical decision making (see chart for details).  Clinical Course   Patient presents with N/V/D.  NO abdominal pain.  No fever.  No urinary or vaginal complaints.  VSS, NAD. Moist mucous membranes.  Abdomen is soft and benign.  DDx includes gastritis, infectious diarrhea, appendicitis, cholecystitis, or other acute intra-abdominal etiology. Zofran and IVF.  Labs without acute abnormalities.  She is not pregnant.  UA does not appear infectious.  Upon recheck, patient much improved.  Able to tolerate PO without difficulty.  Given benign abdominal exam and reassuring lab work, low suspicion for acute intra-abdominal etiology.  Likely gastritis. Patient has Zofran at home.  Follow up PCP.  Return precautions discussed including sudden abdominal pain, inability to tolerate PO, or fever.   Patient agrees and acknowledges the above plan for discharge.   Final Clinical Impressions(s) / ED Diagnoses   Final diagnoses:  Nausea vomiting and diarrhea    New Prescriptions New Prescriptions   No medications on file     Cheri FowlerKayla Verlon Carcione,  Cordelia Poche-C 02/05/16 1204    Benjiman CoreNathan Pickering, MD 02/05/16 [phone]

## 2016-12-10 IMAGING — CT CT ABD-PELV W/ CM
2 of 4 series · 16 of 46 positions shown, 18 images · IV contrast (APPLIED)
Comparison: 11/19/2014

CLINICAL DATA: Bilateral lower abdominal pain and decreased
appetite today.

EXAM:
CT ABDOMEN AND PELVIS WITH CONTRAST
TECHNIQUE: Multidetector CT imaging of the abdomen and pelvis was performed
using the standard protocol following bolus administration of
intravenous contrast.
CONTRAST:  100mL OMNIPAQUE IOHEXOL 300 MG/ML  SOLN

[Series 2: abd/ pelvis 5.0 i30f 1 · axial · 0.81mm/px · z∈[+668,+1093]mm · 13 of 93 slices shown, 15 images]
[im 4/93  soft-tissue]
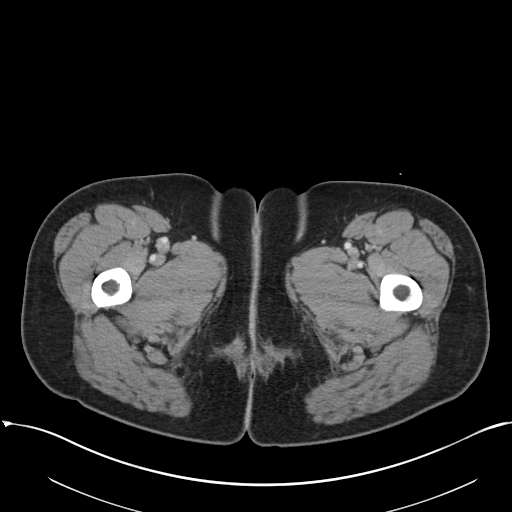
[im 4/93  bone]
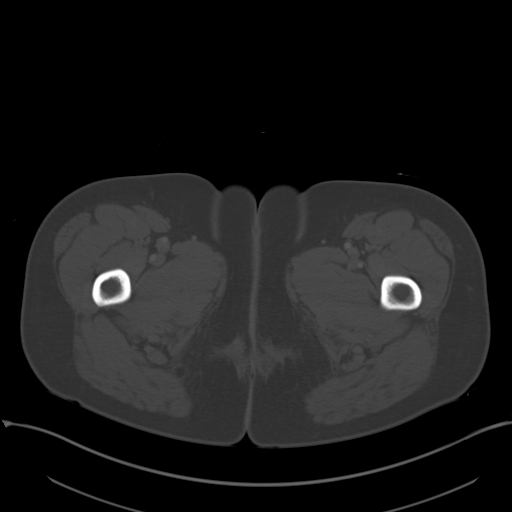
[im 12/93  soft-tissue]
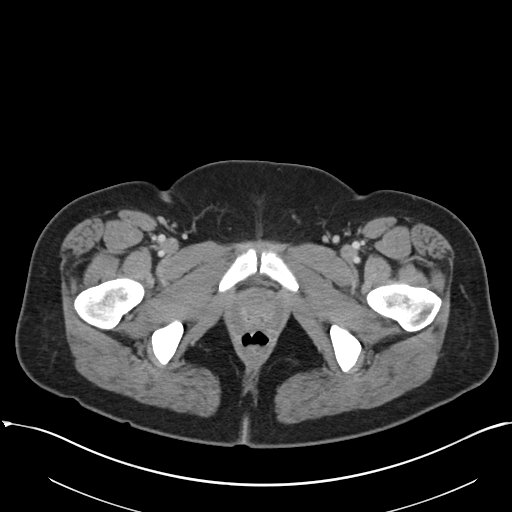
[im 19/93  soft-tissue]
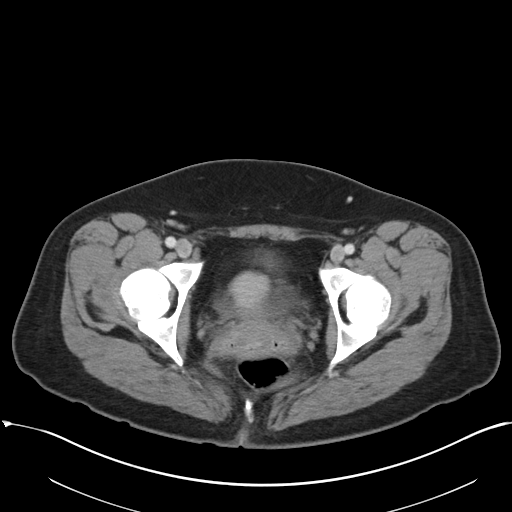
[im 26/93  soft-tissue]
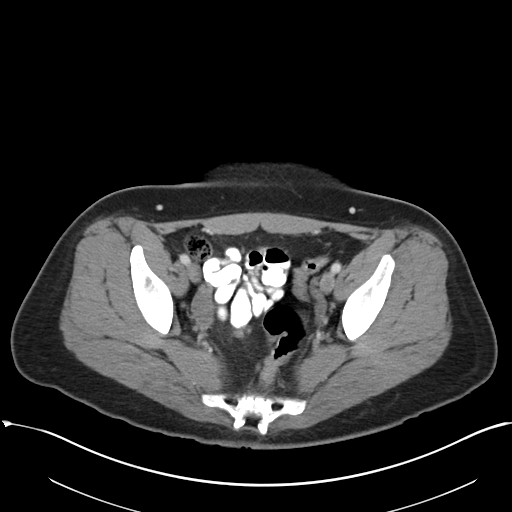
[im 34/93  soft-tissue]
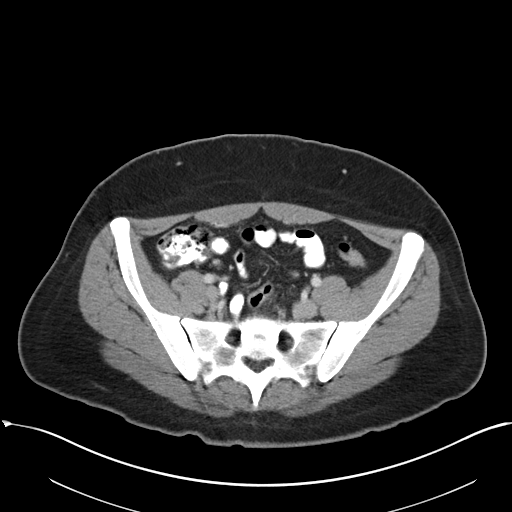
[im 41/93  soft-tissue]
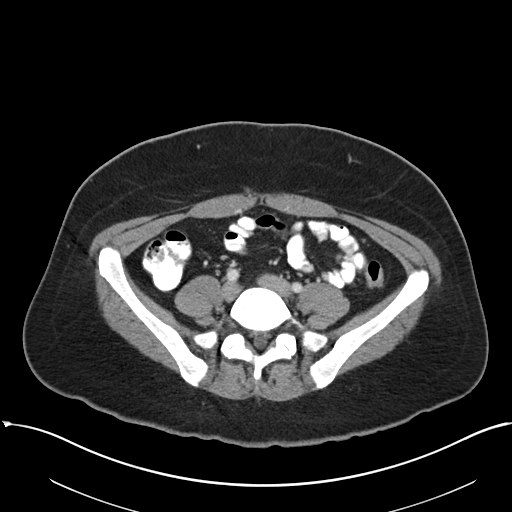
[im 48/93  soft-tissue]
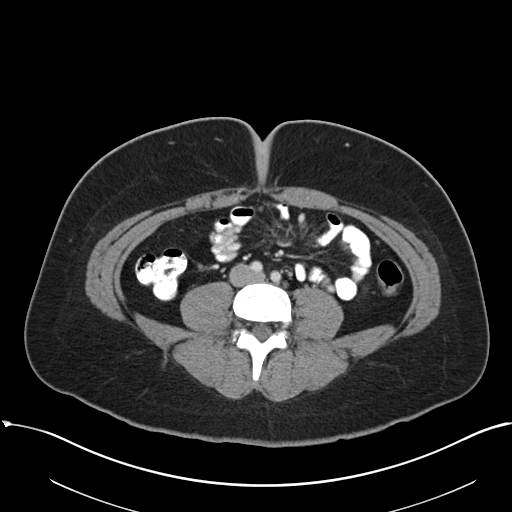
[im 52/93  soft-tissue]
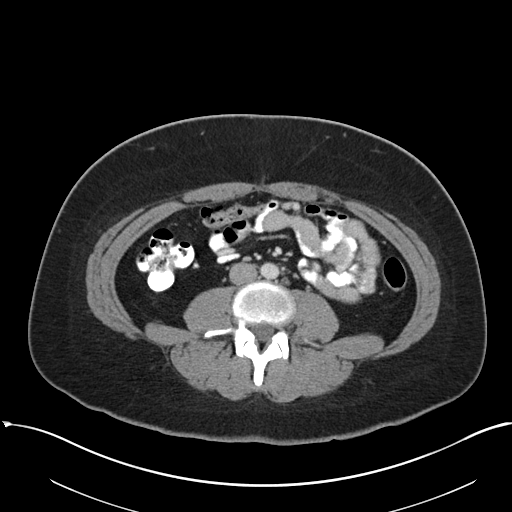
[im 59/93  soft-tissue]
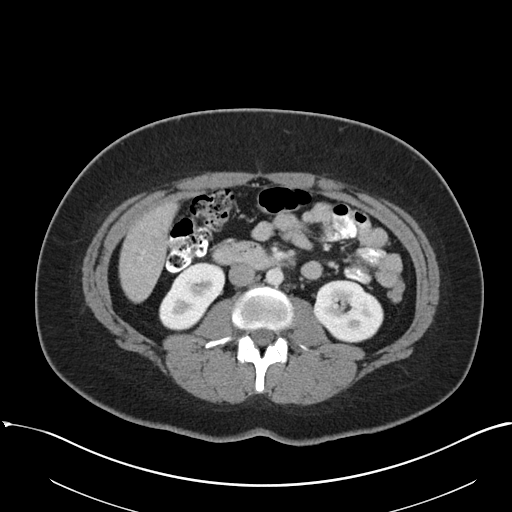
[im 59/93  bone]
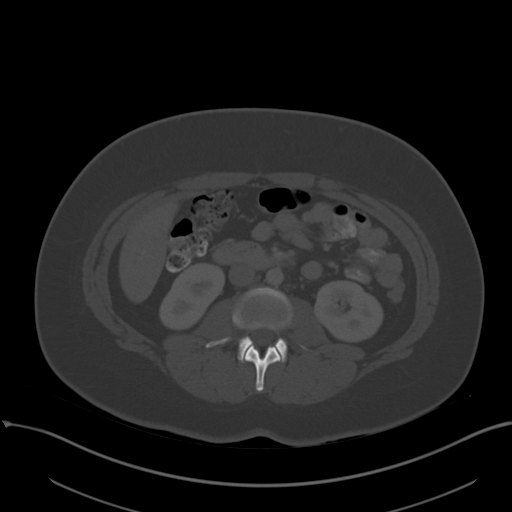
[im 67/93  soft-tissue]
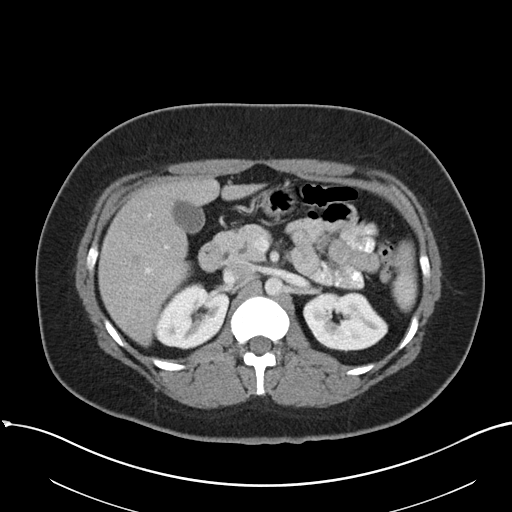
[im 74/93  soft-tissue]
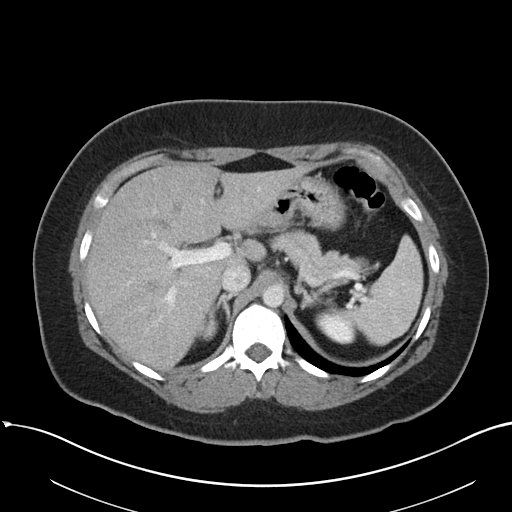
[im 81/93  soft-tissue]
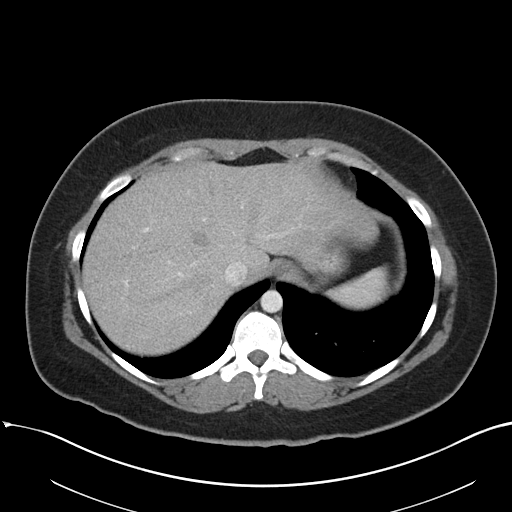
[im 89/93  soft-tissue]
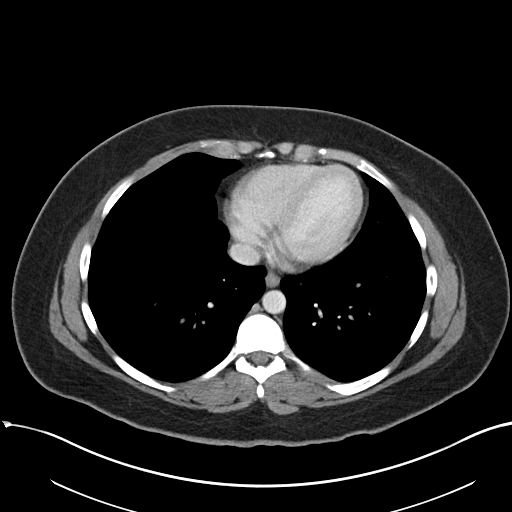

[Series 5: coronal soft tissue · coronal · 0.81mm/px · 3 of 94 slices shown]
[im 32/94  soft-tissue]
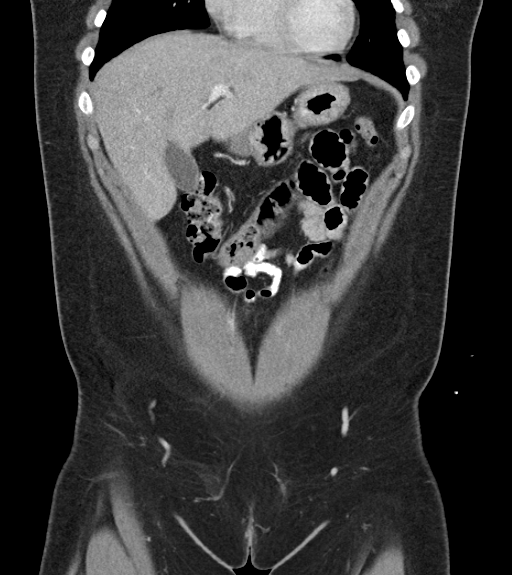
[im 42/94  soft-tissue]
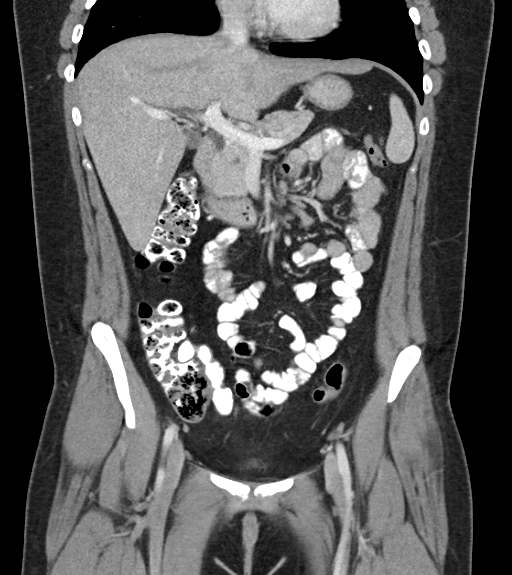
[im 52/94  soft-tissue]
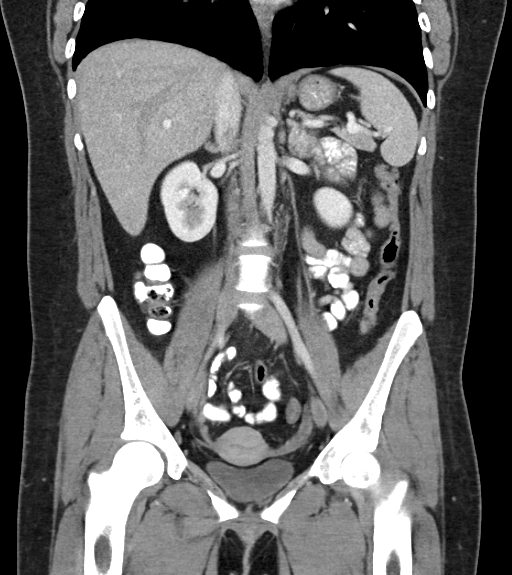

[16 of 46 positions shown; findings below may reference images not displayed]

FINDINGS: Lower chest: The lung bases are clear of acute process. No pleural
effusion or pulmonary lesions. The heart is normal in size. No
pericardial effusion. The distal esophagus and aorta are
unremarkable.

Hepatobiliary: No focal hepatic lesions or intrahepatic biliary
dilatation. The gallbladder is normal. No common bile duct
dilatation.

Pancreas: No mass, inflammation or ductal dilatation.

Spleen: Normal size.  No focal lesions.

Adrenals/Urinary Tract: The adrenal glands are normal. Both kidneys
are normal.

Stomach/Bowel: The stomach, duodenum, small bowel and colon are
unremarkable. No inflammatory changes, mass lesions or obstructive
findings. The terminal ileum is normal. The appendix is normal.

Vascular/Lymphatic: No mesenteric or retroperitoneal mass or
adenopathy. Scattered small lymph nodes are noted. The aorta and
branch vessels are patent. The major venous structures are patent.

Reproductive: The uterus is normal and stable. Stable bilateral
ovarian cysts. The cyst on the left is likely hemorrhagic. No
significant change since 9732.

Other: No pelvic mass or adenopathy. No free pelvic fluid
collections. No inguinal mass or adenopathy. No significant bony
findings.

Musculoskeletal: No significant bony findings.
IMPRESSION: No acute abdominal/ pelvic findings, mass lesions or adenopathy.

Stable bilateral ovarian lesions.

## 2019-12-03 ENCOUNTER — Ambulatory Visit (INDEPENDENT_AMBULATORY_CARE_PROVIDER_SITE_OTHER): Payer: Medicaid Other | Admitting: Advanced Practice Midwife

## 2019-12-03 ENCOUNTER — Encounter: Payer: Self-pay | Admitting: Advanced Practice Midwife

## 2019-12-03 VITALS — Ht 64.0 in | Wt 196.0 lb

## 2019-12-03 DIAGNOSIS — Z3046 Encounter for surveillance of implantable subdermal contraceptive: Secondary | ICD-10-CM | POA: Diagnosis not present

## 2019-12-03 NOTE — Progress Notes (Signed)
   NEXPLANON REMOVAL Patient name: Abigail Gray MRN [id number]  Date of birth: 20-Nov-1990 Subjective Findings:   Abigail Gray is a 29 y.o. G0P0000 Caucasian female being seen today for removal of a Nexplanon. Her Nexplanon was placed in 2015, to the best of her knowledge. She has been sexually active but it has been at least 1 yr.  She desires removal because of expiration. Is not currently sexually active and doesn't desire contraception. Signed copy of informed consent in chart.   Patient's last menstrual period was 11/24/2019 (exact date). Last pap 2015. Results were:  normal per report, but pt describes having a colposcopy afterwards The planned method of family planning is abstinence. No flowsheet data found.  Pertinent History Reviewed:   Reviewed past medical,surgical, social, obstetrical and family history.  Reviewed problem list, medications and allergies. Objective Findings & Procedure:    Vitals:   12/03/19 0836  Weight: 196 lb (88.9 kg)  Height: 5\' 4"  (1.626 m)  Body mass index is 33.64 kg/m.  No results found for this or any previous visit (from the past 24 hour(s)).   Time out was performed.  Nexplanon site identified.  Area prepped in usual sterile fashon. One cc of 2% lidocaine was used to anesthetize the area at the distal end of the implant. A small stab incision was made right beside the implant on the distal portion.  The Nexplanon rod was grasped using hemostats and removed without difficulty.  There was less than 3 cc blood loss. There were no complications.  Steri-strips were applied over the small incision and a pressure bandage was applied.  The patient tolerated the procedure well. Assessment & Plan:   1) Nexplanon removal She was instructed to keep the area clean and dry, remove pressure bandage in 24 hours, and keep insertion site covered with the steri-strip for 3-5 days.   Follow-up PRN problems.  No orders of the defined types were placed in this  encounter.   Follow-up: Return for Pap & Physical, 1st available.  CNM 12/03/2019 9:31 AM

## 2021-06-13 ENCOUNTER — Encounter (HOSPITAL_COMMUNITY): Payer: Self-pay | Admitting: Emergency Medicine

## 2021-06-13 ENCOUNTER — Emergency Department (HOSPITAL_COMMUNITY)
Admission: EM | Admit: 2021-06-13 | Discharge: 2021-06-13 | Disposition: A | Discharge status: Home / Self Care | Payer: Medicaid Other | Attending: Emergency Medicine | Admitting: Emergency Medicine

## 2021-06-13 DIAGNOSIS — R531 Weakness: Secondary | ICD-10-CM | POA: Insufficient documentation

## 2021-06-13 DIAGNOSIS — R591 Generalized enlarged lymph nodes: Secondary | ICD-10-CM | POA: Insufficient documentation

## 2021-06-13 DIAGNOSIS — Z5321 Procedure and treatment not carried out due to patient leaving prior to being seen by health care provider: Secondary | ICD-10-CM | POA: Insufficient documentation

## 2021-06-13 NOTE — ED Triage Notes (Signed)
Patient here for evaluation of swollen lymph nodes on right neck and anterior to right ear that started approximately one year ago. Patient states she has appointment this Friday to be evaluated for lymphoma but came to ED for evaluation of generalized weakness that started three days ago. Patient alert, oriented, and in no apparent distress at this time.

## 2022-04-21 LAB — OB RESULTS CONSOLE ABO/RH: RH Type: POSITIVE

## 2022-04-21 LAB — OB RESULTS CONSOLE RPR: RPR: NONREACTIVE

## 2022-04-21 LAB — OB RESULTS CONSOLE GC/CHLAMYDIA
Chlamydia: NEGATIVE
Neisseria Gonorrhea: NEGATIVE

## 2022-04-21 LAB — OB RESULTS CONSOLE RUBELLA ANTIBODY, IGM: Rubella: IMMUNE

## 2022-04-21 LAB — OB RESULTS CONSOLE HEPATITIS B SURFACE ANTIGEN: Hepatitis B Surface Ag: NEGATIVE

## 2022-04-21 LAB — HEPATITIS C ANTIBODY: HCV Ab: NEGATIVE

## 2022-04-21 LAB — OB RESULTS CONSOLE HIV ANTIBODY (ROUTINE TESTING): HIV: NONREACTIVE

## 2022-04-28 ENCOUNTER — Other Ambulatory Visit: Payer: Self-pay

## 2022-07-30 ENCOUNTER — Other Ambulatory Visit: Payer: Self-pay

## 2022-07-30 ENCOUNTER — Encounter (HOSPITAL_COMMUNITY): Payer: Self-pay | Admitting: *Deleted

## 2022-07-30 ENCOUNTER — Emergency Department (HOSPITAL_COMMUNITY)
Admission: EM | Admit: 2022-07-30 | Discharge: 2022-07-30 | Disposition: A | Discharge status: Home / Self Care | Payer: 59 | Attending: Emergency Medicine | Admitting: Emergency Medicine

## 2022-07-30 DIAGNOSIS — O99891 Other specified diseases and conditions complicating pregnancy: Secondary | ICD-10-CM | POA: Diagnosis not present

## 2022-07-30 DIAGNOSIS — M79602 Pain in left arm: Secondary | ICD-10-CM | POA: Diagnosis not present

## 2022-07-30 DIAGNOSIS — Z7982 Long term (current) use of aspirin: Secondary | ICD-10-CM | POA: Diagnosis not present

## 2022-07-30 DIAGNOSIS — Z3A23 23 weeks gestation of pregnancy: Secondary | ICD-10-CM | POA: Insufficient documentation

## 2022-07-30 DIAGNOSIS — M25512 Pain in left shoulder: Secondary | ICD-10-CM | POA: Insufficient documentation

## 2022-07-30 DIAGNOSIS — O26892 Other specified pregnancy related conditions, second trimester: Secondary | ICD-10-CM | POA: Diagnosis present

## 2022-07-30 MED ORDER — CYCLOBENZAPRINE HCL 10 MG PO TABS: 10.0000 mg | ORAL_TABLET | Freq: Two times a day (BID) | ORAL | 0 refills | Status: DC

## 2022-07-30 NOTE — ED Provider Notes (Signed)
[facility] Provider Note   CSN: [id number] Arrival date & time: 07/30/22  0901     History {Add pertinent medical, surgical, social history, OB history to HPI:1} Chief Complaint  Patient presents with  . Arm Pain    Abigail Gray is a 32 y.o. female.   Arm Pain Pertinent negatives include no chest pain, no abdominal pain, no headaches and no shortness of breath.       Abigail Gray is a 32 y.o. female currently [redacted] weeks pregnant G1, P0 who presents to the Emergency Department complaining of pins and needle sensation of her left hand and arm.  Symptoms began several weeks ago but has gradually worsened and becoming more frequent.  Symptoms worse yesterday.    Home Medications Prior to Admission medications   Medication Sig Start Date End Date Taking? Authorizing Provider  Aspirin-Salicylamide-Caffeine (BC HEADACHE POWDER PO) Take 1 each by mouth every 6 (six) hours as needed (pain).    [provider]  ibuprofen (ADVIL,MOTRIN) 800 MG tablet Take 1 tablet (800 mg total) by mouth every 8 (eight) hours as needed for mild pain. 08/08/15   Ward, Delice Bison, DO      Allergies    Doxycycline and Shellfish allergy    Review of Systems   Review of Systems  Constitutional:  Negative for chills and fever.  Respiratory:  Negative for shortness of breath.   Cardiovascular:  Negative for chest pain.  Gastrointestinal:  Negative for abdominal pain, nausea and vomiting.  Musculoskeletal:  Negative for back pain and neck pain.  Neurological:  Positive for numbness (pin and needles sensation left hand and left arm). Negative for dizziness, seizures, syncope, facial asymmetry, speech difficulty, weakness, light-headedness and headaches.    Physical Exam Updated Vital Signs BP (!) 154/85   Pulse 99   Temp 97.8 F (36.6 C) (Oral)   Resp 16   Ht 5\' 3"  (1.6 m)   Wt 97.1 kg   SpO2 100%   BMI 37.91 kg/m  Physical Exam Vitals  and nursing note reviewed.  Constitutional:      General: She is not in acute distress.    Appearance: Normal appearance. She is not ill-appearing or toxic-appearing.  HENT:     Head: Atraumatic.  Cardiovascular:     Rate and Rhythm: Normal rate and regular rhythm.     Pulses: Normal pulses.  Pulmonary:     Effort: Pulmonary effort is normal.  Abdominal:     Comments: Abdomen is gravid, non tender  Musculoskeletal:        General: Normal range of motion.     Cervical back: Normal range of motion. No rigidity or tenderness.  Lymphadenopathy:     Cervical: No cervical adenopathy.  Skin:    General: Skin is warm.     Capillary Refill: Capillary refill takes less than 2 seconds.     Findings: No rash.  Neurological:     General: No focal deficit present.     Mental Status: She is alert.     Sensory: No sensory deficit.     Motor: No weakness.    ED Results / Procedures / Treatments   Labs (all labs ordered are listed, but only abnormal results are displayed) Labs Reviewed - No data to display  EKG None  Radiology No results found.  Procedures Procedures  {Document cardiac monitor, telemetry assessment procedure when appropriate:1}  Medications Ordered in ED Medications - No data to  display  ED Course/ Medical Decision Making/ A&P   {   Click here for ABCD2, HEART and other calculatorsREFRESH Note before signing :1}                          Medical Decision Making Amount and/or Complexity of Data Reviewed Discussion of management or test interpretation with external provider(s): Pain and paraesthesias of the left arm and hand felt to be MSK.  I consulted pharmacy here for recommendations of potential use of flexeril.  It was felt that flexeril is ok to use only if benefit outweighs risk.  There are no contraindications for its use in pregnancy but studies on safety in pregnancy are limited.  I have discussed this in detail with patient and she verbalized understanding  and agrees to only use limited quantity for brief period of time and only when needed.  She will follow up closely with her OB/GYN     {Document critical care time when appropriate:1} {Document review of labs and clinical decision tools ie heart score, Chads2Vasc2 etc:1}  {Document your independent review of radiology images, and any outside records:1} {Document your discussion with family members, caretakers, and with consultants:1} {Document social determinants of health affecting pt's care:1} {Document your decision making why or why not admission, treatments were needed:1} Final Clinical Impression(s) / ED Diagnoses Final diagnoses:  None    Rx / DC Orders ED Discharge Orders     None

## 2022-07-30 NOTE — Discharge Instructions (Signed)
As discussed, only use this medication when needed.  May cause drowsiness.  Try using over-the-counter 4% lidocaine patches to your shoulder.  You may take Tylenol every 4 hours if needed.  Please follow-up with your OB/GYN for recheck.

## 2022-07-30 NOTE — ED Triage Notes (Signed)
Pt c/o pain that starts at fingers of left hand and radiates up the arm that started a few weeks but has gotten worse yesterday, denies any injury, pt is currently [redacted] weeks pregnant,

## 2022-08-08 ENCOUNTER — Other Ambulatory Visit: Payer: Self-pay | Admitting: Obstetrics and Gynecology

## 2022-08-08 DIAGNOSIS — Z363 Encounter for antenatal screening for malformations: Secondary | ICD-10-CM

## 2022-08-28 ENCOUNTER — Ambulatory Visit: Payer: 59

## 2022-08-30 ENCOUNTER — Encounter: Payer: Self-pay | Admitting: *Deleted

## 2022-08-30 ENCOUNTER — Ambulatory Visit: Payer: 59 | Attending: Obstetrics and Gynecology

## 2022-08-30 ENCOUNTER — Other Ambulatory Visit: Payer: Self-pay | Admitting: Obstetrics and Gynecology

## 2022-08-30 ENCOUNTER — Ambulatory Visit: Payer: 59 | Admitting: *Deleted

## 2022-08-30 ENCOUNTER — Other Ambulatory Visit: Payer: Self-pay | Admitting: *Deleted

## 2022-08-30 VITALS — BP 124/78 | HR 95

## 2022-08-30 DIAGNOSIS — O99212 Obesity complicating pregnancy, second trimester: Secondary | ICD-10-CM

## 2022-08-30 DIAGNOSIS — Z3689 Encounter for other specified antenatal screening: Secondary | ICD-10-CM | POA: Diagnosis present

## 2022-08-30 DIAGNOSIS — Z363 Encounter for antenatal screening for malformations: Secondary | ICD-10-CM

## 2022-10-07 ENCOUNTER — Inpatient Hospital Stay (HOSPITAL_COMMUNITY)
Admission: AD | Admit: 2022-10-07 | Discharge: 2022-10-07 | Disposition: A | Discharge status: Home / Self Care | Payer: 59 | Attending: Obstetrics and Gynecology | Admitting: Obstetrics and Gynecology

## 2022-10-07 ENCOUNTER — Encounter (HOSPITAL_COMMUNITY): Payer: Self-pay | Admitting: Obstetrics and Gynecology

## 2022-10-07 ENCOUNTER — Inpatient Hospital Stay (HOSPITAL_BASED_OUTPATIENT_CLINIC_OR_DEPARTMENT_OTHER): Payer: 59

## 2022-10-07 DIAGNOSIS — O98813 Other maternal infectious and parasitic diseases complicating pregnancy, third trimester: Secondary | ICD-10-CM | POA: Diagnosis not present

## 2022-10-07 DIAGNOSIS — B3731 Acute candidiasis of vulva and vagina: Secondary | ICD-10-CM

## 2022-10-07 DIAGNOSIS — O99213 Obesity complicating pregnancy, third trimester: Secondary | ICD-10-CM

## 2022-10-07 DIAGNOSIS — E669 Obesity, unspecified: Secondary | ICD-10-CM | POA: Diagnosis not present

## 2022-10-07 DIAGNOSIS — O98819 Other maternal infectious and parasitic diseases complicating pregnancy, unspecified trimester: Secondary | ICD-10-CM

## 2022-10-07 DIAGNOSIS — Z3A33 33 weeks gestation of pregnancy: Secondary | ICD-10-CM | POA: Insufficient documentation

## 2022-10-07 DIAGNOSIS — R109 Unspecified abdominal pain: Secondary | ICD-10-CM | POA: Insufficient documentation

## 2022-10-07 DIAGNOSIS — O26893 Other specified pregnancy related conditions, third trimester: Secondary | ICD-10-CM | POA: Diagnosis present

## 2022-10-07 DIAGNOSIS — O4693 Antepartum hemorrhage, unspecified, third trimester: Secondary | ICD-10-CM | POA: Diagnosis present

## 2022-10-07 LAB — WET PREP, GENITAL
Clue Cells Wet Prep HPF POC: NONE SEEN
Sperm: NONE SEEN
Trich, Wet Prep: NONE SEEN
WBC, Wet Prep HPF POC: >=10 — AB (ref <10)

## 2022-10-07 MED ORDER — TERCONAZOLE 0.4 % VA CREA: 1.0000 | TOPICAL_CREAM | Freq: Every day | VAGINAL | 0 refills | Status: DC

## 2022-10-07 NOTE — MAU Note (Signed)
Pt says she woke - and went to b-room - blood in toilet - and nickel size clot on paper.  Cramping started at 0530. Last sex- Wed  In Triage - says she just went to b-room in lobby - to collect urine- and says no bleeding.  Still has cramping and back pain - 5/10

## 2022-10-07 NOTE — MAU Provider Note (Cosign Needed Addendum)
History     CSN: [id number]  Arrival date and time: 10/07/22 4696   Event Date/Time   First Provider Initiated Contact with Patient 10/07/22 [phone]      Chief Complaint  Patient presents with   Vaginal Bleeding   Abdominal Pain     Abigail Gray is a 32 y.o. G1P0 at [redacted]w[redacted]d who receives care at [facility].  Patient reports she has an appt on Wednesday April 17th.  She presents today for vaginal bleeding.  She reports cramping onset at 0530 that woke her out of her sleep.  She states she went to the bathroom and noted blood in the toilet, that she says "looked like a lot" and a nickel sized clot with wiping.  She states no bleeding since arrival, but reports continued cramping. She reports it is usually constant, but has been "off and on today."  She rates the pain a 5/10. She denies relieving factors, but states "hunching over" makes the cramping worse.  Last sexual encounter on Wednesday (3 days ago) that was without pain or discomfort. She denies issues with urination or diarrhea.  She reports frequent constipation and states she completed an enema two days ago.    OB History     Gravida  1   Para  0   Term  0   Preterm  0   AB  0   Living  0      SAB  0   IAB  0   Ectopic  0   Multiple  0   Live Births  0           Past Medical History:  Diagnosis Date   Depression    Headache    Hemorrhoids    Kidney stones    UTI (lower urinary tract infection)     Past Surgical History:  Procedure Laterality Date   NO PAST SURGERIES      Family History  Problem Relation Age of Onset   Cancer Father        lung   Cancer Mother        ovarian and breast     Social History   Tobacco Use   Smoking status: Former    Packs/day: .5    Types: Cigarettes   Smokeless tobacco: Never  Vaping Use   Vaping Use: Never used  Substance Use Topics   Alcohol use: No   Drug use: Yes    Types: Marijuana    Comment: occasionally    Allergies:   Allergies  Allergen Reactions   Doxycycline Other (See Comments)    Caused yeast infection    Shellfish Allergy Hives and Diarrhea    Medications Prior to Admission  Medication Sig Dispense Refill Last Dose   HYDROcodone-acetaminophen (NORCO/VICODIN) 5-325 MG tablet Take 1 tablet by mouth every 6 (six) hours as needed for moderate pain.   10/06/2022   Prenatal Vit-Fe Fumarate-FA (PRENATAL MULTIVITAMIN) TABS tablet Take 1 tablet by mouth daily at 12 noon.   10/06/2022   Aspirin-Salicylamide-Caffeine (BC HEADACHE POWDER PO) Take 1 each by mouth every 6 (six) hours as needed (pain). (Patient not taking: Reported on 08/30/2022)      cyclobenzaprine (FLEXERIL) 10 MG tablet Take 1 tablet (10 mg total) by mouth 2 (two) times daily. As needed (Patient not taking: Reported on 08/30/2022) 12 tablet 0    ibuprofen (ADVIL,MOTRIN) 800 MG tablet Take 1 tablet (800 mg total) by mouth every 8 (eight) hours as needed  for mild pain. (Patient not taking: Reported on 08/30/2022) 30 tablet 0     Review of Systems  Gastrointestinal:  Negative for abdominal pain, constipation and diarrhea.  Genitourinary:  Positive for vaginal bleeding and vaginal discharge ("Extra discharge" that started yesterday). Negative for difficulty urinating, dyspareunia and dysuria.   Physical Exam   Blood pressure 133/78, pulse (!) 109, temperature 98.3 F (36.8 C), temperature source Oral, resp. rate 18, height 5\' 3"  (1.6 m), weight 104.2 kg, last menstrual period 02/17/2022.  Physical Exam Vitals reviewed. Exam conducted with a chaperone present.  Constitutional:      Appearance: She is well-developed.  HENT:     Head: Normocephalic and atraumatic.  Eyes:     Conjunctiva/sclera: Conjunctivae normal.  Cardiovascular:     Rate and Rhythm: Normal rate.  Pulmonary:     Effort: Pulmonary effort is normal. No respiratory distress.  Abdominal:     Palpations: Abdomen is soft.     Tenderness: There is abdominal tenderness in the right  lower quadrant.  Genitourinary:    Comments: Speculum Exam: -Normal External Genitalia: Non tender, no apparent discharge at introitus.  -Vaginal Vault: Pink mucosa with good rugae. Small amt white discharge -wet prep collected -Cervix:Pink, no lesions, cysts, or polyps.  Appears closed. No active bleeding from os-GC/CT collected -Bimanual Exam: Dilation: Closed Effacement (%): Thick Cervical Position: Posterior Exam by:: Sabas Sous CNM Musculoskeletal:        General: Normal range of motion.  Skin:    General: Skin is warm and dry.  Neurological:     Mental Status: She is alert and oriented to person, place, and time.  Psychiatric:        Mood and Affect: Mood normal.        Behavior: Behavior normal.     Fetal Assessment 145 bpm, Mod Var, -Decels, +Accels Toco: No ctx graphed  MAU Course   Results for orders placed or performed during the hospital encounter of 10/07/22 (from the past 24 hour(s))  Wet prep, genital     Status: Abnormal   Collection Time: 10/07/22  7:53 AM  Result Value Ref Range   Yeast Wet Prep HPF POC PRESENT (A) NONE SEEN   Trich, Wet Prep NONE SEEN NONE SEEN   Clue Cells Wet Prep HPF POC NONE SEEN NONE SEEN   WBC, Wet Prep HPF POC >=10 (A) <10   Sperm NONE SEEN     MDM PE Labs: Wet Prep, GC/CT EFM Limited US Assessment and Plan  32 year old G1P0  SIUP at 33.1 weeks Cat I FT Vaginal Bleeding Abdominal Cramping  -POC Reviewed -Exam performed and findings discussed. -Cultures collected -Patient offered and declines pain medication.  -Send for [place] and await results.    Cherre Robins MSN, CNM 10/07/2022, 7:36 AM   Reassessment (8:07 AM) -Report given to C. Druscilla Brownie, CNM for assumption of care.  Cherre Robins MSN, CNM Advanced Practice Provider, [facility]    Reviewed results of yeast on wet prep and recommended treatment plan. Patient agreeable to plan of care.   1. Candidiasis of vagina during pregnancy   2. [redacted]  weeks gestation of pregnancy    -Discharge home in stable condition -Rx for terazol sent to pharmacy -Third trimester precautions discussed -Patient advised to follow-up with OB as scheduled for prenatal care -Patient may return to MAU as needed or if her condition were to change or worsen  Rolm Bookbinder, CNM 10/07/22 8:59 AM

## 2022-10-07 NOTE — Discharge Instructions (Signed)

## 2022-10-09 ENCOUNTER — Ambulatory Visit: Payer: 59 | Attending: Maternal & Fetal Medicine

## 2022-10-09 ENCOUNTER — Ambulatory Visit: Payer: 59

## 2022-10-09 DIAGNOSIS — O99212 Obesity complicating pregnancy, second trimester: Secondary | ICD-10-CM | POA: Insufficient documentation

## 2022-10-10 LAB — GC/CHLAMYDIA PROBE AMP (~~LOC~~) NOT AT ARMC
Chlamydia: NEGATIVE
Comment: NEGATIVE
Comment: NORMAL
Neisseria Gonorrhea: NEGATIVE

## 2022-10-24 ENCOUNTER — Inpatient Hospital Stay (HOSPITAL_COMMUNITY)
Admission: AD | Admit: 2022-10-24 | Discharge: 2022-10-24 | Disposition: A | Discharge status: Home / Self Care | Payer: 59 | Attending: Obstetrics and Gynecology | Admitting: Obstetrics and Gynecology

## 2022-10-24 ENCOUNTER — Other Ambulatory Visit: Payer: Self-pay

## 2022-10-24 ENCOUNTER — Encounter (HOSPITAL_COMMUNITY): Payer: Self-pay | Admitting: Obstetrics and Gynecology

## 2022-10-24 DIAGNOSIS — O10013 Pre-existing essential hypertension complicating pregnancy, third trimester: Secondary | ICD-10-CM | POA: Diagnosis not present

## 2022-10-24 DIAGNOSIS — L299 Pruritus, unspecified: Secondary | ICD-10-CM | POA: Diagnosis present

## 2022-10-24 DIAGNOSIS — O10913 Unspecified pre-existing hypertension complicating pregnancy, third trimester: Secondary | ICD-10-CM

## 2022-10-24 DIAGNOSIS — O26893 Other specified pregnancy related conditions, third trimester: Secondary | ICD-10-CM | POA: Insufficient documentation

## 2022-10-24 DIAGNOSIS — O10919 Unspecified pre-existing hypertension complicating pregnancy, unspecified trimester: Secondary | ICD-10-CM

## 2022-10-24 DIAGNOSIS — Z3A35 35 weeks gestation of pregnancy: Secondary | ICD-10-CM | POA: Diagnosis not present

## 2022-10-24 LAB — URINALYSIS, ROUTINE W REFLEX MICROSCOPIC
Bilirubin Urine: NEGATIVE
Glucose, UA: NEGATIVE mg/dL
Hgb urine dipstick: NEGATIVE
Ketones, ur: NEGATIVE mg/dL
Nitrite: NEGATIVE
Protein, ur: NEGATIVE mg/dL
Specific Gravity, Urine: 1.009 (ref 1.005–1.030)
pH: 7 (ref 5.0–8.0)

## 2022-10-24 LAB — COMPREHENSIVE METABOLIC PANEL
ALT: 21 U/L (ref 0–44)
AST: 26 U/L (ref 15–41)
Albumin: 2.8 g/dL — ABNORMAL LOW (ref 3.5–5.0)
Alkaline Phosphatase: 137 U/L — ABNORMAL HIGH (ref 38–126)
Anion gap: 10 (ref 5–15)
BUN: 5 mg/dL — ABNORMAL LOW (ref 6–20)
CO2: 23 mmol/L (ref 22–32)
Calcium: 10.9 mg/dL — ABNORMAL HIGH (ref 8.9–10.3)
Chloride: 104 mmol/L (ref 98–111)
Creatinine, Ser: 0.71 mg/dL (ref 0.44–1.00)
GFR, Estimated: >60 mL/min (ref >60)
Glucose, Bld: 102 mg/dL — ABNORMAL HIGH (ref 70–99)
Potassium: 3.3 mmol/L — ABNORMAL LOW (ref 3.5–5.1)
Sodium: 137 mmol/L (ref 135–145)
Total Bilirubin: 0.4 mg/dL (ref 0.3–1.2)
Total Protein: 6.6 g/dL (ref 6.5–8.1)

## 2022-10-24 MED ORDER — HYDROXYZINE HCL 50 MG/ML IM SOLN
50.0000 mg | Freq: Once | INTRAMUSCULAR | Status: DC
  Filled 2022-10-24: qty 1

## 2022-10-24 MED ORDER — FLUCONAZOLE 150 MG PO TABS: 150.0000 mg | ORAL_TABLET | Freq: Every day | ORAL | 0 refills | Status: DC

## 2022-10-24 MED ORDER — ONDANSETRON 4 MG PO TBDP
8.0000 mg | ORAL_TABLET | Freq: Once | ORAL | Status: AC
  Administered 2022-10-24: 8 mg via ORAL
  Filled 2022-10-24: qty 2

## 2022-10-24 MED ORDER — NIFEDIPINE ER OSMOTIC RELEASE 30 MG PO TB24: 30.0000 mg | ORAL_TABLET | Freq: Every day | ORAL | 2 refills | Status: DC

## 2022-10-24 MED ORDER — HYDROXYZINE PAMOATE 25 MG PO CAPS: 25.0000 mg | ORAL_CAPSULE | Freq: Three times a day (TID) | ORAL | 0 refills | Status: DC | PRN

## 2022-10-24 NOTE — MAU Note (Signed)
Abigail Gray is a 32 y.o. at [redacted]w[redacted]d here in MAU reporting: severe itching that began last night but worsened last night, reports itching started on hands and feet and it's now all over.  States took Benadryl @ 0400 and no relief.  Also reports unable to keep anything down, reports food isn't staying down but able to consume ice. Denies VB or LOF.  Endorses +FM.  LMP: NA Onset of complaint: a few days ago Pain score: 6 Vitals:   10/24/22 0722  BP: (!) 145/87  Pulse: 97  Resp: 18  Temp: 98.1 F (36.7 C)  SpO2: 97%     FHT:147 bpm Lab orders placed from triage:   UA

## 2022-10-24 NOTE — MAU Provider Note (Cosign Needed Addendum)
History     CSN: [id number]  Arrival date and time: 10/24/22 0707   Event Date/Time   First Provider Initiated Contact with Patient 10/24/22 [phone]      Chief Complaint  Patient presents with   Itching   HPI  KATLIN BORTNER is a 32 y.o. G1P0000 at [redacted]w[redacted]d who presents for evaluation of whole body itching. Patient reports it started suddenly last night on her hands and feet. She reports it progressively got worse throughout the night until it was her whole body. She tried benedryl with no relief. She denies any pain.  She denies any vaginal bleeding, discharge, and leaking of fluid. Denies any constipation, diarrhea or any urinary complaints. Reports normal fetal movement.  She also reports nausea every day for the entire pregnancy and the diclegis doesn't help. She has known CHTN, not on medication. She denies any HA, visual changes or epigastric pain.   OB History     Gravida  1   Para  0   Term  0   Preterm  0   AB  0   Living  0      SAB  0   IAB  0   Ectopic  0   Multiple  0   Live Births  0           Past Medical History:  Diagnosis Date   Depression    Headache    Hemorrhoids    Kidney stones    UTI (lower urinary tract infection)     Past Surgical History:  Procedure Laterality Date   NO PAST SURGERIES      Family History  Problem Relation Age of Onset   Cancer Father        lung   Cancer Mother        ovarian and breast     Social History   Tobacco Use   Smoking status: Former    Packs/day: .5    Types: Cigarettes   Smokeless tobacco: Never  Vaping Use   Vaping Use: Never used  Substance Use Topics   Alcohol use: No   Drug use: Not Currently    Types: Marijuana    Comment: occasionally    Allergies:  Allergies  Allergen Reactions   Doxycycline Other (See Comments)    Caused yeast infection    Shellfish Allergy Hives and Diarrhea    Medications Prior to Admission  Medication Sig Dispense Refill Last Dose    diphenhydrAMINE (BENADRYL) 25 mg capsule Take 25 mg by mouth every 6 (six) hours as needed.   10/24/2022 at 0400   Aspirin-Salicylamide-Caffeine (BC HEADACHE POWDER PO) Take 1 each by mouth every 6 (six) hours as needed (pain). (Patient not taking: Reported on 08/30/2022)      cyclobenzaprine (FLEXERIL) 10 MG tablet Take 1 tablet (10 mg total) by mouth 2 (two) times daily. As needed (Patient not taking: Reported on 08/30/2022) 12 tablet 0    HYDROcodone-acetaminophen (NORCO/VICODIN) 5-325 MG tablet Take 1 tablet by mouth every 6 (six) hours as needed for moderate pain.      ibuprofen (ADVIL,MOTRIN) 800 MG tablet Take 1 tablet (800 mg total) by mouth every 8 (eight) hours as needed for mild pain. (Patient not taking: Reported on 08/30/2022) 30 tablet 0    Prenatal Vit-Fe Fumarate-FA (PRENATAL MULTIVITAMIN) TABS tablet Take 1 tablet by mouth daily at 12 noon.      terconazole (TERAZOL 7) 0.4 % vaginal cream Place 1 applicator vaginally at bedtime.  45 g 0     Review of Systems  Constitutional: Negative.  Negative for fatigue and fever.  HENT: Negative.    Respiratory: Negative.  Negative for shortness of breath.   Cardiovascular: Negative.  Negative for chest pain.  Gastrointestinal:  Positive for nausea. Negative for abdominal pain, constipation, diarrhea and vomiting.  Genitourinary: Negative.  Negative for dysuria, vaginal bleeding and vaginal discharge.  Skin:        itching  Neurological: Negative.  Negative for dizziness and headaches.   Physical Exam   Blood pressure (!) 145/87, pulse 97, temperature 98.1 F (36.7 C), temperature source Oral, resp. rate 18, height 5\' 3"  (1.6 m), weight 104.4 kg, last menstrual period 02/17/2022, SpO2 97 %.  Patient Vitals for the past 24 hrs:  BP Temp Temp src Pulse Resp SpO2 Height Weight  10/24/22 0722 (!) 145/87 98.1 F (36.7 C) Oral 97 18 97 % 5\' 3"  (1.6 m) 104.4 kg    Physical Exam Vitals and nursing note reviewed.  Constitutional:      General:  She is not in acute distress.    Appearance: She is well-developed.  HENT:     Head: Normocephalic.  Eyes:     Pupils: Pupils are equal, round, and reactive to light.  Cardiovascular:     Rate and Rhythm: Normal rate and regular rhythm.     Heart sounds: Normal heart sounds.  Pulmonary:     Effort: Pulmonary effort is normal. No respiratory distress.     Breath sounds: Normal breath sounds.  Abdominal:     General: Bowel sounds are normal. There is no distension.     Palpations: Abdomen is soft.     Tenderness: There is no abdominal tenderness.  Skin:    General: Skin is warm and dry.     Findings: No erythema or rash.  Neurological:     Mental Status: She is alert and oriented to person, place, and time.  Psychiatric:        Mood and Affect: Mood normal.        Behavior: Behavior normal.        Thought Content: Thought content normal.        Judgment: Judgment normal.    Fetal Tracing:  Baseline: 135 Variability: moderate Accels: 15x15 Decels: none  Toco: none  MAU Course  Procedures  No results found for this or any previous visit (from the past 24 hour(s)).   No results found.   MDM Prenatal records from community office reviewed. Pregnancy complicated by [facility] ordered and reviewed.   UA CMP, Bile acids Zofran  Patient driving, unable to give vistaril in MAU. Discussed rx for home use and patient agreeable  Care turned over to J. Albirtha Grinage NP at 0800.  Rolm Bookbinder, CNM 10/24/22  CMP with normal liver enzymes. Reassuring overall.  BP remained mildly elevated, she has documented CHTN, she is asymptomatic. No HA, no vision changes. BP's are not in severe range. Rx: Procardia 30 XL daily.  Close follow up discussed with Dr. Langston Masker. She is already for a f/u in the office on Thursday.   Duane Lope, NP 10/24/2022 1:10 PM    Assessment and Plan

## 2022-10-24 NOTE — MAU Note (Signed)
Patient sleeping

## 2022-10-25 LAB — CULTURE, OB URINE: Culture: NO GROWTH

## 2022-10-25 LAB — BILE ACIDS, TOTAL: Bile Acids Total: 6.9 umol/L (ref 0.0–10.0)

## 2022-10-26 ENCOUNTER — Encounter (HOSPITAL_COMMUNITY): Payer: Self-pay | Admitting: Obstetrics and Gynecology

## 2022-10-26 ENCOUNTER — Inpatient Hospital Stay (HOSPITAL_COMMUNITY)
Admission: AD | Admit: 2022-10-26 | Discharge: 2022-10-26 | Disposition: A | Discharge status: Home / Self Care | Payer: 59 | Attending: Obstetrics and Gynecology | Admitting: Obstetrics and Gynecology

## 2022-10-26 DIAGNOSIS — O10919 Unspecified pre-existing hypertension complicating pregnancy, unspecified trimester: Secondary | ICD-10-CM

## 2022-10-26 DIAGNOSIS — R030 Elevated blood-pressure reading, without diagnosis of hypertension: Secondary | ICD-10-CM | POA: Diagnosis present

## 2022-10-26 DIAGNOSIS — Z87891 Personal history of nicotine dependence: Secondary | ICD-10-CM | POA: Insufficient documentation

## 2022-10-26 DIAGNOSIS — R519 Headache, unspecified: Secondary | ICD-10-CM | POA: Diagnosis not present

## 2022-10-26 DIAGNOSIS — O10913 Unspecified pre-existing hypertension complicating pregnancy, third trimester: Secondary | ICD-10-CM | POA: Insufficient documentation

## 2022-10-26 DIAGNOSIS — Z3A35 35 weeks gestation of pregnancy: Secondary | ICD-10-CM | POA: Diagnosis not present

## 2022-10-26 DIAGNOSIS — O26893 Other specified pregnancy related conditions, third trimester: Secondary | ICD-10-CM | POA: Diagnosis not present

## 2022-10-26 LAB — COMPREHENSIVE METABOLIC PANEL
ALT: 17 U/L (ref 0–44)
AST: 20 U/L (ref 15–41)
Albumin: 2.6 g/dL — ABNORMAL LOW (ref 3.5–5.0)
Alkaline Phosphatase: 138 U/L — ABNORMAL HIGH (ref 38–126)
Anion gap: 9 (ref 5–15)
BUN: 7 mg/dL (ref 6–20)
CO2: 19 mmol/L — ABNORMAL LOW (ref 22–32)
Calcium: 8.6 mg/dL — ABNORMAL LOW (ref 8.9–10.3)
Chloride: 107 mmol/L (ref 98–111)
Creatinine, Ser: 0.58 mg/dL (ref 0.44–1.00)
GFR, Estimated: >60 mL/min (ref >60)
Glucose, Bld: 95 mg/dL (ref 70–99)
Potassium: 3.1 mmol/L — ABNORMAL LOW (ref 3.5–5.1)
Sodium: 135 mmol/L (ref 135–145)
Total Bilirubin: 0.3 mg/dL (ref 0.3–1.2)
Total Protein: 6.3 g/dL — ABNORMAL LOW (ref 6.5–8.1)

## 2022-10-26 LAB — CBC
HCT: 30.9 % — ABNORMAL LOW (ref 36.0–46.0)
Hemoglobin: 10.5 g/dL — ABNORMAL LOW (ref 12.0–15.0)
MCH: 30.2 pg (ref 26.0–34.0)
MCHC: 34 g/dL (ref 30.0–36.0)
MCV: 88.8 fL (ref 80.0–100.0)
Platelets: 283 10*3/uL (ref 150–400)
RBC: 3.48 MIL/uL — ABNORMAL LOW (ref 3.87–5.11)
RDW: 13.3 % (ref 11.5–15.5)
WBC: 14.2 10*3/uL — ABNORMAL HIGH (ref 4.0–10.5)
nRBC: 0 % (ref 0.0–0.2)

## 2022-10-26 LAB — PROTEIN / CREATININE RATIO, URINE
Creatinine, Urine: 132 mg/dL
Protein Creatinine Ratio: 0.16 mg/mg{Cre} — ABNORMAL HIGH (ref 0.00–0.15)
Total Protein, Urine: 21 mg/dL

## 2022-10-26 MED ORDER — ACETAMINOPHEN-CAFFEINE 500-65 MG PO TABS
2.0000 | ORAL_TABLET | Freq: Once | ORAL | Status: AC
  Administered 2022-10-26: 2 via ORAL
  Filled 2022-10-26: qty 2

## 2022-10-26 MED ORDER — METOCLOPRAMIDE HCL 10 MG PO TABS
10.0000 mg | ORAL_TABLET | Freq: Once | ORAL | Status: AC
  Administered 2022-10-26: 10 mg via ORAL
  Filled 2022-10-26: qty 1

## 2022-10-26 NOTE — MAU Note (Signed)
...  Abigail Gray is a 32 y.o. at [redacted]w[redacted]d here in MAU reporting: Sent over from office for two severe range pressures. Endorses an ongoing HA since she took her first dose of Procardia this past Tuesday after being discharged from MAU. Denies visual disturbances and RUQ/epigastric pain. Endorses "puffy" eyes. Absent clonus. Plus 2 patellar reflexes. Denies VB or LOF. +FM.   Last took Tylenol yesterday morning with no relief from her HA. Has not eaten yet today.  Onset of complaint: Today Pain score: 6/10 lower abdomen  FHT: 147 initial external Lab orders placed from triage:  none - awaiting orders from CNM. Urine sample left.

## 2022-10-26 NOTE — MAU Provider Note (Signed)
History     CSN: [id number]  Arrival date and time: 10/26/22 1207   Event Date/Time   First Provider Initiated Contact with Patient 10/26/22 1244      Chief Complaint  Patient presents with   Hypertension   Headache   HPI  Abigail Gray is a 32 y.o. G1P0000 at [redacted]w[redacted]d who presents for evaluation of blood pressures. Patient has known CHTN and was in the office for her routine visit today. Patient reports her blood pressure was severe range. She also reports a headache. Patient rates the pain as a 6/10 and has not tried anything for the pain. She denies any visual changes or epigastric pain. She denies any vaginal bleeding, discharge, and leaking of fluid. Denies any constipation, diarrhea or any urinary complaints. Reports normal fetal movement.   OB History     Gravida  1   Para  0   Term  0   Preterm  0   AB  0   Living  0      SAB  0   IAB  0   Ectopic  0   Multiple  0   Live Births  0           Past Medical History:  Diagnosis Date   Depression    Headache    Hemorrhoids    Kidney stones    UTI (lower urinary tract infection)     Past Surgical History:  Procedure Laterality Date   NO PAST SURGERIES      Family History  Problem Relation Age of Onset   Cancer Father        lung   Cancer Mother        ovarian and breast     Social History   Tobacco Use   Smoking status: Former    Packs/day: .5    Types: Cigarettes   Smokeless tobacco: Never  Vaping Use   Vaping Use: Never used  Substance Use Topics   Alcohol use: No   Drug use: Not Currently    Types: Marijuana    Comment: occasionally    Allergies:  Allergies  Allergen Reactions   Doxycycline Other (See Comments)    Caused yeast infection    Shellfish Allergy Hives and Diarrhea    No medications prior to admission.    Review of Systems  Constitutional: Negative.  Negative for fatigue and fever.  HENT: Negative.    Respiratory: Negative.  Negative for shortness of  breath.   Cardiovascular: Negative.  Negative for chest pain.  Gastrointestinal: Negative.  Negative for abdominal pain, constipation, diarrhea, nausea and vomiting.  Genitourinary: Negative.  Negative for dysuria, vaginal bleeding and vaginal discharge.  Neurological:  Positive for headaches. Negative for dizziness.   Physical Exam   Blood pressure 122/64, pulse 68, temperature 98.6 F (37 C), temperature source Oral, resp. rate 20, height 5\' 3"  (1.6 m), weight 105.2 kg, last menstrual period 02/17/2022, SpO2 100 %.  Patient Vitals for the past 24 hrs:  BP Temp Temp src Pulse Resp SpO2 Height Weight  10/26/22 1445 122/64 -- -- 68 -- -- -- --  10/26/22 1415 125/65 -- -- 65 -- 100 % -- --  10/26/22 1400 120/67 -- -- 75 -- 98 % -- --  10/26/22 1345 114/61 -- -- 73 -- 100 % -- --  10/26/22 1330 125/62 -- -- 81 -- 99 % -- --  10/26/22 1315 135/88 -- -- 94 -- -- -- --  10/26/22 1300  137/86 -- -- 97 -- -- -- --  10/26/22 1245 133/81 -- -- 99 -- 98 % -- --  10/26/22 1239 (!) 141/78 98.6 F (37 C) Oral 85 20 -- 5\' 3"  (1.6 m) 105.2 kg    Physical Exam Vitals and nursing note reviewed.  Constitutional:      General: She is not in acute distress.    Appearance: She is well-developed.  HENT:     Head: Normocephalic.  Eyes:     Pupils: Pupils are equal, round, and reactive to light.  Cardiovascular:     Rate and Rhythm: Normal rate and regular rhythm.     Heart sounds: Normal heart sounds.  Pulmonary:     Effort: Pulmonary effort is normal. No respiratory distress.     Breath sounds: Normal breath sounds.  Abdominal:     General: Bowel sounds are normal. There is no distension.     Palpations: Abdomen is soft.     Tenderness: There is no abdominal tenderness.  Skin:    General: Skin is warm and dry.  Neurological:     Mental Status: She is alert and oriented to person, place, and time.  Psychiatric:        Mood and Affect: Mood normal.        Behavior: Behavior normal.         Thought Content: Thought content normal.        Judgment: Judgment normal.     Fetal Tracing:  Baseline: 130 Variability: moderate Accels: 15x15 Decels: none  Toco: none      MAU Course  Procedures  Results for orders placed or performed during the hospital encounter of 10/26/22 (from the past 24 hour(s))  CBC     Status: Abnormal   Collection Time: 10/26/22 12:53 PM  Result Value Ref Range   WBC 14.2 (H) 4.0 - 10.5 K/uL   RBC 3.48 (L) 3.87 - 5.11 MIL/uL   Hemoglobin 10.5 (L) 12.0 - 15.0 g/dL   HCT 40.9 (L) 81.1 - 91.4 %   MCV 88.8 80.0 - 100.0 fL   MCH 30.2 26.0 - 34.0 pg   MCHC 34.0 30.0 - 36.0 g/dL   RDW 78.2 95.6 - 21.3 %   Platelets 283 150 - 400 K/uL   nRBC 0.0 0.0 - 0.2 %  Comprehensive metabolic panel     Status: Abnormal   Collection Time: 10/26/22 12:53 PM  Result Value Ref Range   Sodium 135 135 - 145 mmol/L   Potassium 3.1 (L) 3.5 - 5.1 mmol/L   Chloride 107 98 - 111 mmol/L   CO2 19 (L) 22 - 32 mmol/L   Glucose, Bld 95 70 - 99 mg/dL   BUN 7 6 - 20 mg/dL   Creatinine, Ser 0.86 0.44 - 1.00 mg/dL   Calcium 8.6 (L) 8.9 - 10.3 mg/dL   Total Protein 6.3 (L) 6.5 - 8.1 g/dL   Albumin 2.6 (L) 3.5 - 5.0 g/dL   AST 20 15 - 41 U/L   ALT 17 0 - 44 U/L   Alkaline Phosphatase 138 (H) 38 - 126 U/L   Total Bilirubin 0.3 0.3 - 1.2 mg/dL   GFR, Estimated >57 >84 mL/min   Anion gap 9 5 - 15  Protein / creatinine ratio, urine     Status: Abnormal   Collection Time: 10/26/22 12:57 PM  Result Value Ref Range   Creatinine, Urine 132 mg/dL   Total Protein, Urine 21 mg/dL   Protein Creatinine Ratio 0.16 (  H) 0.00 - 0.15 mg/mg[Cre]    MDM Prenatal records from community office reviewed. Pregnancy complicated by [facility], A1GDM Labs ordered and reviewed.   UA CBC, CMP, Protein/creat ratio  Excedrin Tension/Reglan  Patient reports resolution of HA in MAU  Assessment and Plan   1. Pregnancy headache in third trimester   2. [redacted] weeks gestation of pregnancy   3.  Chronic hypertension affecting pregnancy     -Discharge home in stable condition -Preeclampsia precautions discussed -Patient advised to follow-up with OB as scheduled for prenatal care -Patient may return to MAU as needed or if her condition were to change or worsen  Rolm Bookbinder, CNM 10/26/2022, 12:45 PM

## 2022-10-26 NOTE — Discharge Instructions (Signed)

## 2022-11-02 NOTE — H&P (Signed)
Abigail Gray is a 32 y.o. female presenting for two stage IOL. Pregnancy complicated by probable CHTN now on nifedipine. HA on/off and BPs labile 140-170s/80-100s. Also A1GDM with A1C of 5.7 I 2023, Hx of depression and stopped Wellbutrin with positive pregnancy test, Hx of migraine HA with aura, and obesity 232#. OB History     Gravida  1   Para  0   Term  0   Preterm  0   AB  0   Living  0      SAB  0   IAB  0   Ectopic  0   Multiple  0   Live Births  0          Past Medical History:  Diagnosis Date   Depression    Headache    Hemorrhoids    Kidney stones    UTI (lower urinary tract infection)    Past Surgical History:  Procedure Laterality Date   NO PAST SURGERIES     Family History: family history includes Cancer in her father and mother. Social History:  reports that she has quit smoking. Her smoking use included cigarettes. She smoked an average of .5 packs per day. She has never used smokeless tobacco. She reports that she does not currently use drugs after having used the following drugs: Marijuana. She reports that she does not drink alcohol.     Maternal Diabetes: Yes:  Diabetes Type:  Diet controlled Genetic Screening: Normal Maternal Ultrasounds/Referrals: Normal Fetal Ultrasounds or other Referrals:  None Maternal Substance Abuse:  No Significant Maternal Medications:  Meds include: Other: nifedipine Significant Maternal Lab Results:  None Number of Prenatal Visits:greater than 3 verified prenatal visits Other Comments:  None  Review of Systems  Constitutional:  Negative for fever.   Maternal Medical History:  Fetal activity: Perceived fetal activity is normal.       Last menstrual period 02/17/2022. Maternal Exam:  Abdomen: Fetal presentation: vertex   Physical Exam Cardiovascular:     Rate and Rhythm: Normal rate.  Pulmonary:     Effort: Pulmonary effort is normal.     Prenatal labs: ABO, Rh: O/Positive/-- (10/27  0000) Antibody:   Rubella: Immune (10/27 0000) RPR: Nonreactive (10/27 0000)  HBsAg: Negative (10/27 0000)  HIV: Non-reactive (10/27 0000)  GBS:     Assessment/Plan: 32 yo G1P0 @ 37 07 wks with CHTN on nifedipine with labile BP sometimes severe range, A1GDM Labs on admission Two stage induction of labor GBBS ordered>PCN until results return   Roselle Locus II 11/02/2022, 5:47 PM

## 2022-11-03 ENCOUNTER — Inpatient Hospital Stay (HOSPITAL_COMMUNITY): Payer: 59

## 2022-11-03 ENCOUNTER — Other Ambulatory Visit: Payer: Self-pay

## 2022-11-03 ENCOUNTER — Inpatient Hospital Stay (HOSPITAL_COMMUNITY)
Admission: RE | Admit: 2022-11-03 | Discharge: 2022-11-06 | DRG: 788 | Disposition: A | Discharge status: Home / Self Care | Payer: 59 | Attending: Obstetrics and Gynecology | Admitting: Obstetrics and Gynecology

## 2022-11-03 ENCOUNTER — Encounter (HOSPITAL_COMMUNITY): Payer: Self-pay | Admitting: Obstetrics and Gynecology

## 2022-11-03 DIAGNOSIS — O99214 Obesity complicating childbirth: Secondary | ICD-10-CM | POA: Diagnosis present

## 2022-11-03 DIAGNOSIS — O1092 Unspecified pre-existing hypertension complicating childbirth: Secondary | ICD-10-CM | POA: Diagnosis present

## 2022-11-03 DIAGNOSIS — O24420 Gestational diabetes mellitus in childbirth, diet controlled: Secondary | ICD-10-CM | POA: Diagnosis present

## 2022-11-03 DIAGNOSIS — Z3A37 37 weeks gestation of pregnancy: Secondary | ICD-10-CM | POA: Diagnosis not present

## 2022-11-03 DIAGNOSIS — O24429 Gestational diabetes mellitus in childbirth, unspecified control: Secondary | ICD-10-CM | POA: Diagnosis not present

## 2022-11-03 DIAGNOSIS — O9902 Anemia complicating childbirth: Secondary | ICD-10-CM | POA: Diagnosis present

## 2022-11-03 DIAGNOSIS — Z87891 Personal history of nicotine dependence: Secondary | ICD-10-CM

## 2022-11-03 DIAGNOSIS — O134 Gestational [pregnancy-induced] hypertension without significant proteinuria, complicating childbirth: Secondary | ICD-10-CM | POA: Diagnosis not present

## 2022-11-03 DIAGNOSIS — O10919 Unspecified pre-existing hypertension complicating pregnancy, unspecified trimester: Principal | ICD-10-CM | POA: Diagnosis present

## 2022-11-03 LAB — COMPREHENSIVE METABOLIC PANEL
ALT: 18 U/L (ref 0–44)
AST: 25 U/L (ref 15–41)
Albumin: 2.6 g/dL — ABNORMAL LOW (ref 3.5–5.0)
Alkaline Phosphatase: 155 U/L — ABNORMAL HIGH (ref 38–126)
Anion gap: 10 (ref 5–15)
BUN: 8 mg/dL (ref 6–20)
CO2: 20 mmol/L — ABNORMAL LOW (ref 22–32)
Calcium: 8.7 mg/dL — ABNORMAL LOW (ref 8.9–10.3)
Chloride: 104 mmol/L (ref 98–111)
Creatinine, Ser: 0.66 mg/dL (ref 0.44–1.00)
GFR, Estimated: >60 mL/min (ref >60)
Glucose, Bld: 134 mg/dL — ABNORMAL HIGH (ref 70–99)
Potassium: 2.9 mmol/L — ABNORMAL LOW (ref 3.5–5.1)
Sodium: 134 mmol/L — ABNORMAL LOW (ref 135–145)
Total Bilirubin: 0.2 mg/dL — ABNORMAL LOW (ref 0.3–1.2)
Total Protein: 6.3 g/dL — ABNORMAL LOW (ref 6.5–8.1)

## 2022-11-03 LAB — TYPE AND SCREEN
ABO/RH(D): O POS
Antibody Screen: NEGATIVE

## 2022-11-03 LAB — CBC
HCT: 30.4 % — ABNORMAL LOW (ref 36.0–46.0)
HCT: 29.4 % — ABNORMAL LOW (ref 36.0–46.0)
HCT: 30.2 % — ABNORMAL LOW (ref 36.0–46.0)
Hemoglobin: 10.1 g/dL — ABNORMAL LOW (ref 12.0–15.0)
Hemoglobin: 9.8 g/dL — ABNORMAL LOW (ref 12.0–15.0)
Hemoglobin: 10.2 g/dL — ABNORMAL LOW (ref 12.0–15.0)
MCH: 29.5 pg (ref 26.0–34.0)
MCH: 29.8 pg (ref 26.0–34.0)
MCH: 29.6 pg (ref 26.0–34.0)
MCHC: 33.2 g/dL (ref 30.0–36.0)
MCHC: 33.3 g/dL (ref 30.0–36.0)
MCHC: 33.8 g/dL (ref 30.0–36.0)
MCV: 88.9 fL (ref 80.0–100.0)
MCV: 89.4 fL (ref 80.0–100.0)
MCV: 87.5 fL (ref 80.0–100.0)
Platelets: 293 10*3/uL (ref 150–400)
Platelets: 272 10*3/uL (ref 150–400)
Platelets: 262 10*3/uL (ref 150–400)
RBC: 3.42 MIL/uL — ABNORMAL LOW (ref 3.87–5.11)
RBC: 3.29 MIL/uL — ABNORMAL LOW (ref 3.87–5.11)
RBC: 3.45 MIL/uL — ABNORMAL LOW (ref 3.87–5.11)
RDW: 13.4 % (ref 11.5–15.5)
RDW: 13.4 % (ref 11.5–15.5)
RDW: 13.4 % (ref 11.5–15.5)
WBC: 13.5 10*3/uL — ABNORMAL HIGH (ref 4.0–10.5)
WBC: 12.1 10*3/uL — ABNORMAL HIGH (ref 4.0–10.5)
WBC: 10.8 10*3/uL — ABNORMAL HIGH (ref 4.0–10.5)
nRBC: 0 % (ref 0.0–0.2)
nRBC: 0 % (ref 0.0–0.2)
nRBC: 0 % (ref 0.0–0.2)

## 2022-11-03 LAB — GLUCOSE, CAPILLARY
Glucose-Capillary: 136 mg/dL — ABNORMAL HIGH (ref 70–99)
Glucose-Capillary: 95 mg/dL (ref 70–99)
Glucose-Capillary: 95 mg/dL (ref 70–99)
Glucose-Capillary: 95 mg/dL (ref 70–99)
Glucose-Capillary: 106 mg/dL — ABNORMAL HIGH (ref 70–99)
Glucose-Capillary: 106 mg/dL — ABNORMAL HIGH (ref 70–99)

## 2022-11-03 LAB — HIV ANTIBODY (ROUTINE TESTING W REFLEX): HIV Screen 4th Generation wRfx: NONREACTIVE

## 2022-11-03 LAB — RPR: RPR Ser Ql: NONREACTIVE

## 2022-11-03 LAB — GROUP B STREP BY PCR: Group B strep by PCR: NEGATIVE

## 2022-11-03 MED ORDER — SOD CITRATE-CITRIC ACID 500-334 MG/5ML PO SOLN
30.0000 mL | ORAL | Status: DC | PRN
  Administered 2022-11-04: 30 mL via ORAL
  Filled 2022-11-03: qty 30

## 2022-11-03 MED ORDER — FENTANYL-BUPIVACAINE-NACL 0.5-0.125-0.9 MG/250ML-% EP SOLN
12.0000 mL/h | EPIDURAL | Status: DC | PRN
  Administered 2022-11-04: 12 mL/h via EPIDURAL
  Filled 2022-11-03: qty 250

## 2022-11-03 MED ORDER — ACETAMINOPHEN 325 MG PO TABS: 650.0000 mg | ORAL_TABLET | ORAL | Status: DC | PRN

## 2022-11-03 MED ORDER — SODIUM CHLORIDE 0.9 % IV SOLN
5.0000 10*6.[IU] | Freq: Once | INTRAVENOUS | Status: AC
  Administered 2022-11-03: 5 10*6.[IU] via INTRAVENOUS
  Filled 2022-11-03: qty 5

## 2022-11-03 MED ORDER — PHENYLEPHRINE 80 MCG/ML (10ML) SYRINGE FOR IV PUSH (FOR BLOOD PRESSURE SUPPORT): 80.0000 ug | PREFILLED_SYRINGE | INTRAVENOUS | Status: DC | PRN

## 2022-11-03 MED ORDER — OXYTOCIN-SODIUM CHLORIDE 30-0.9 UT/500ML-% IV SOLN: 2.5000 [IU]/h | INTRAVENOUS | Status: DC

## 2022-11-03 MED ORDER — NIFEDIPINE ER OSMOTIC RELEASE 30 MG PO TB24
30.0000 mg | ORAL_TABLET | Freq: Every day | ORAL | Status: DC
  Administered 2022-11-03 – 2022-11-04 (×2): 30 mg via ORAL
  Filled 2022-11-03 (×2): qty 1

## 2022-11-03 MED ORDER — DIPHENHYDRAMINE HCL 50 MG/ML IJ SOLN
12.5000 mg | INTRAMUSCULAR | Status: DC | PRN
  Administered 2022-11-04 (×2): 12.5 mg via INTRAVENOUS
  Filled 2022-11-03: qty 1

## 2022-11-03 MED ORDER — HYDROXYZINE HCL 50 MG PO TABS
50.0000 mg | ORAL_TABLET | Freq: Four times a day (QID) | ORAL | Status: DC | PRN
  Administered 2022-11-03: 50 mg via ORAL
  Filled 2022-11-03: qty 1

## 2022-11-03 MED ORDER — OXYCODONE-ACETAMINOPHEN 5-325 MG PO TABS: 1.0000 | ORAL_TABLET | ORAL | Status: DC | PRN

## 2022-11-03 MED ORDER — LACTATED RINGERS IV SOLN: 500.0000 mL | INTRAVENOUS | Status: DC | PRN

## 2022-11-03 MED ORDER — ONDANSETRON HCL 4 MG/2ML IJ SOLN: 4.0000 mg | Freq: Four times a day (QID) | INTRAMUSCULAR | Status: DC | PRN

## 2022-11-03 MED ORDER — EPHEDRINE 5 MG/ML INJ: 10.0000 mg | INTRAVENOUS | Status: DC | PRN

## 2022-11-03 MED ORDER — LACTATED RINGERS IV SOLN: INTRAVENOUS | Status: DC

## 2022-11-03 MED ORDER — OXYTOCIN BOLUS FROM INFUSION: 333.0000 mL | Freq: Once | INTRAVENOUS | Status: DC

## 2022-11-03 MED ORDER — LACTATED RINGERS IV SOLN
500.0000 mL | Freq: Once | INTRAVENOUS | Status: AC
  Administered 2022-11-04: 500 mL via INTRAVENOUS

## 2022-11-03 MED ORDER — MISOPROSTOL 25 MCG QUARTER TABLET
25.0000 ug | ORAL_TABLET | ORAL | Status: DC
  Administered 2022-11-03 (×3): 25 ug via ORAL
  Filled 2022-11-03 (×5): qty 1

## 2022-11-03 MED ORDER — FLEET ENEMA 7-19 GM/118ML RE ENEM: 1.0000 | ENEMA | RECTAL | Status: DC | PRN

## 2022-11-03 MED ORDER — PENICILLIN G POT IN DEXTROSE 60000 UNIT/ML IV SOLN: 3.0000 10*6.[IU] | INTRAVENOUS | Status: DC

## 2022-11-03 MED ORDER — MISOPROSTOL 25 MCG QUARTER TABLET
25.0000 ug | ORAL_TABLET | ORAL | Status: DC | PRN
  Administered 2022-11-03 (×2): 25 ug via VAGINAL
  Filled 2022-11-03 (×2): qty 1

## 2022-11-03 MED ORDER — MISOPROSTOL 25 MCG QUARTER TABLET
25.0000 ug | ORAL_TABLET | ORAL | Status: DC | PRN
  Administered 2022-11-03 – 2022-11-04 (×4): 25 ug via ORAL
  Filled 2022-11-03 (×3): qty 1

## 2022-11-03 MED ORDER — LIDOCAINE HCL (PF) 1 % IJ SOLN: 30.0000 mL | INTRAMUSCULAR | Status: DC | PRN

## 2022-11-03 MED ORDER — POTASSIUM CHLORIDE CRYS ER 20 MEQ PO TBCR
20.0000 meq | EXTENDED_RELEASE_TABLET | Freq: Two times a day (BID) | ORAL | Status: DC
  Administered 2022-11-03: 20 meq via ORAL
  Filled 2022-11-03 (×3): qty 1

## 2022-11-03 MED ORDER — FENTANYL CITRATE (PF) 100 MCG/2ML IJ SOLN
50.0000 ug | INTRAMUSCULAR | Status: DC | PRN
  Administered 2022-11-03 (×5): 100 ug via INTRAVENOUS
  Administered 2022-11-03: 50 ug via INTRAVENOUS
  Administered 2022-11-04 (×2): 100 ug via INTRAVENOUS
  Filled 2022-11-03 (×8): qty 2

## 2022-11-03 MED ORDER — MISOPROSTOL 25 MCG QUARTER TABLET
25.0000 ug | ORAL_TABLET | Freq: Once | ORAL | Status: AC
  Administered 2022-11-03: 25 ug via VAGINAL
  Filled 2022-11-03: qty 1

## 2022-11-03 MED ORDER — TERBUTALINE SULFATE 1 MG/ML IJ SOLN: 0.2500 mg | Freq: Once | INTRAMUSCULAR | Status: DC | PRN

## 2022-11-03 MED ORDER — OXYCODONE-ACETAMINOPHEN 5-325 MG PO TABS: 2.0000 | ORAL_TABLET | ORAL | Status: DC | PRN

## 2022-11-03 NOTE — Progress Notes (Signed)
Mild HA on admission>resolved  Today's Vitals   11/03/22 0514 11/03/22 0658 11/03/22 0705 11/03/22 0725  BP:  (!) 146/87    Pulse:  77    Resp:  16    Temp:      TempSrc:      Weight:      Height:      PainSc: 0-No pain  6  Asleep   Body mass index is 41.4 kg/m.  FHT cat one UCs mild irregular  Cx 0.5/th/-3 Cytotec 25mg  vag/25mg  po @ 1:00 am              25mg  vag @ 5:00 am  DTR 1+  Results for orders placed or performed during the hospital encounter of 11/03/22 (from the past 24 hour(s))  CBC     Status: Abnormal   Collection Time: 11/03/22 12:39 AM  Result Value Ref Range   WBC 13.5 (H) 4.0 - 10.5 K/uL   RBC 3.42 (L) 3.87 - 5.11 MIL/uL   Hemoglobin 10.1 (L) 12.0 - 15.0 g/dL   HCT 09.8 (L) 11.9 - 14.7 %   MCV 88.9 80.0 - 100.0 fL   MCH 29.5 26.0 - 34.0 pg   MCHC 33.2 30.0 - 36.0 g/dL   RDW 82.9 56.2 - 13.0 %   Platelets 293 150 - 400 K/uL   nRBC 0.0 0.0 - 0.2 %  Type and screen [facility]     Status: None   Collection Time: 11/03/22 12:39 AM  Result Value Ref Range   ABO/RH(D) O POS    Antibody Screen NEG    Sample Expiration      11/06/2022,2359 Performed at [facility], [address], [place], [place] [address]   Comprehensive metabolic panel     Status: Abnormal   Collection Time: 11/03/22 12:39 AM  Result Value Ref Range   Sodium 134 (L) 135 - 145 mmol/L   Potassium 2.9 (L) 3.5 - 5.1 mmol/L   Chloride 104 98 - 111 mmol/L   CO2 20 (L) 22 - 32 mmol/L   Glucose, Bld 134 (H) 70 - 99 mg/dL   BUN 8 6 - 20 mg/dL   Creatinine, Ser 4.69 0.44 - 1.00 mg/dL   Calcium 8.7 (L) 8.9 - 10.3 mg/dL   Total Protein 6.3 (L) 6.5 - 8.1 g/dL   Albumin 2.6 (L) 3.5 - 5.0 g/dL   AST 25 15 - 41 U/L   ALT 18 0 - 44 U/L   Alkaline Phosphatase 155 (H) 38 - 126 U/L   Total Bilirubin 0.2 (L) 0.3 - 1.2 mg/dL   GFR, Estimated >62 >95 mL/min   Anion gap 10 5 - 15  Group B strep by PCR     Status: None   Collection Time: 11/03/22 12:39 AM   Specimen:  Vaginal/Rectal; Genital  Result Value Ref Range   Group B strep by PCR NEGATIVE NEGATIVE  HIV Antibody (routine testing w rflx)     Status: None   Collection Time: 11/03/22 12:39 AM  Result Value Ref Range   HIV Screen 4th Generation wRfx Non Reactive Non Reactive  Glucose, capillary     Status: Abnormal   Collection Time: 11/03/22  4:59 AM  Result Value Ref Range   Glucose-Capillary 136 (H) 70 - 99 mg/dL  Glucose, capillary     Status: None   Collection Time: 11/03/22  7:00 AM  Result Value Ref Range   Glucose-Capillary 95 70 - 99 mg/dL  A/P: CHTN-BPs in mild range so far                    Nifedipine XL 30mg  qd         A1DM-mildly elevated glucose early this am while eating candy and sweet tea>she was encouraged not to eat high sugar foods>glucose <100         Hypokalemia-20 mEq KCL x 1                              -repeat labs just drawn         Two stage IOL D/W patient and FOB. Will continue misoprostol.           D/W magnesium sulfate for Sz prophylaxis if she has severe range BP or abnormal labs. She states she understands and agrees

## 2022-11-03 NOTE — Inpatient Diabetes Management (Signed)
Inpatient Diabetes Program Recommendations  AACE/ADA: New Consensus Statement on Inpatient Glycemic Control (2015)  Target Ranges:  Prepandial:   less than 140 mg/dL      Peak postprandial:   less than 180 mg/dL (1-2 hours)      Critically ill patients:  140 - 180 mg/dL   Lab Results  Component Value Date   GLUCAP 95 11/03/2022    Review of Glycemic Control  Latest Reference Range & Units 11/03/22 07:00 11/03/22 10:38  Glucose-Capillary 70 - 99 mg/dL 95 95    Diabetes history: GDMA1 Outpatient Diabetes medications: None Current orders for Inpatient glycemic control: CBG's  Referral received for DM management evaluation.  Glucose trends are WNL.   Will continue to follow while inpatient.  Thank you, Dulce Sellar, MSN, CDCES Diabetes Coordinator Inpatient Diabetes Program [phone] (team pager from 8a-5p)

## 2022-11-03 NOTE — Progress Notes (Addendum)
Handoff completed with off going nurse.

## 2022-11-03 NOTE — Progress Notes (Signed)
Dr Henderson Cloud notified of SVE  2/40/-3 with no change since last cervical check. Orders received to give the next two scheduled doses of cytotec po. Recheck SVE at 0500 hrs.

## 2022-11-03 NOTE — Plan of Care (Signed)
Problem: Education: Goal: Ability to describe self-care measures that may prevent or decrease complications (Diabetes Survival Skills Education) will improve Outcome: Progressing Goal: Individualized Educational Video(s) Outcome: Progressing   Problem: Coping: Goal: Ability to adjust to condition or change in health will improve Outcome: Progressing   Problem: Fluid Volume: Goal: Ability to maintain a balanced intake and output will improve Outcome: Progressing   Problem: Health Behavior/Discharge Planning: Goal: Ability to identify and utilize available resources and services will improve Outcome: Progressing Goal: Ability to manage health-related needs will improve Outcome: Progressing   Problem: Metabolic: Goal: Ability to maintain appropriate glucose levels will improve Outcome: Progressing   Problem: Nutritional: Goal: Maintenance of adequate nutrition will improve Outcome: Progressing Goal: Progress toward achieving an optimal weight will improve Outcome: Progressing   Problem: Skin Integrity: Goal: Risk for impaired skin integrity will decrease Outcome: Progressing   Problem: Tissue Perfusion: Goal: Adequacy of tissue perfusion will improve Outcome: Progressing   Problem: Education: Goal: Knowledge of Childbirth will improve Outcome: Progressing Goal: Ability to make informed decisions regarding treatment and plan of care will improve Outcome: Progressing Goal: Ability to state and carry out methods to decrease the pain will improve Outcome: Progressing Goal: Individualized Educational Video(s) Outcome: Progressing   Problem: Coping: Goal: Ability to verbalize concerns and feelings about labor and delivery will improve Outcome: Progressing   Problem: Life Cycle: Goal: Ability to make normal progression through stages of labor will improve Outcome: Progressing Goal: Ability to effectively push during vaginal delivery will improve Outcome: Progressing    Problem: Role Relationship: Goal: Will demonstrate positive interactions with the child Outcome: Progressing   Problem: Safety: Goal: Risk of complications during labor and delivery will decrease Outcome: Progressing   Problem: Pain Management: Goal: Relief or control of pain from uterine contractions will improve Outcome: Progressing   Problem: Education: Goal: Knowledge of General Education information will improve Description: Including pain rating scale, medication(s)/side effects and non-pharmacologic comfort measures Outcome: Progressing   Problem: Health Behavior/Discharge Planning: Goal: Ability to manage health-related needs will improve Outcome: Progressing   Problem: Clinical Measurements: Goal: Ability to maintain clinical measurements within normal limits will improve Outcome: Progressing Goal: Will remain free from infection Outcome: Progressing Goal: Diagnostic test results will improve Outcome: Progressing Goal: Respiratory complications will improve Outcome: Progressing Goal: Cardiovascular complication will be avoided Outcome: Progressing   Problem: Activity: Goal: Risk for activity intolerance will decrease Outcome: Progressing   Problem: Nutrition: Goal: Adequate nutrition will be maintained Outcome: Progressing   Problem: Coping: Goal: Level of anxiety will decrease Outcome: Progressing   Problem: Elimination: Goal: Will not experience complications related to bowel motility Outcome: Progressing Goal: Will not experience complications related to urinary retention Outcome: Progressing   Problem: Pain Managment: Goal: General experience of comfort will improve Outcome: Progressing   Problem: Safety: Goal: Ability to remain free from injury will improve Outcome: Progressing   Problem: Skin Integrity: Goal: Risk for impaired skin integrity will decrease Outcome: Progressing   Problem: Education: Goal: Knowledge of Childbirth will  improve Outcome: Progressing Goal: Ability to make informed decisions regarding treatment and plan of care will improve Outcome: Progressing Goal: Ability to state and carry out methods to decrease the pain will improve Outcome: Progressing Goal: Individualized Educational Video(s) Outcome: Progressing   Problem: Coping: Goal: Ability to verbalize concerns and feelings about labor and delivery will improve Outcome: Progressing   Problem: Life Cycle: Goal: Ability to make normal progression through stages of labor will improve Outcome: Progressing Goal: Ability  to effectively push during vaginal delivery will improve Outcome: Progressing   Problem: Role Relationship: Goal: Will demonstrate positive interactions with the child Outcome: Progressing   Problem: Safety: Goal: Risk of complications during labor and delivery will decrease Outcome: Progressing   Problem: Pain Management: Goal: Relief or control of pain from uterine contractions will improve Outcome: Progressing   Problem: Education: Goal: Knowledge of disease or condition will improve Outcome: Progressing Goal: Knowledge of the prescribed therapeutic regimen will improve Outcome: Progressing   Problem: Fluid Volume: Goal: Peripheral tissue perfusion will improve Outcome: Progressing   Problem: Clinical Measurements: Goal: Complications related to disease process, condition or treatment will be avoided or minimized Outcome: Progressing

## 2022-11-04 ENCOUNTER — Inpatient Hospital Stay (HOSPITAL_COMMUNITY): Payer: 59 | Admitting: Anesthesiology

## 2022-11-04 ENCOUNTER — Encounter (HOSPITAL_COMMUNITY): Payer: Self-pay | Admitting: Obstetrics and Gynecology

## 2022-11-04 ENCOUNTER — Other Ambulatory Visit: Payer: Self-pay

## 2022-11-04 ENCOUNTER — Encounter (HOSPITAL_COMMUNITY)
Admission: RE | Disposition: A | Discharge status: Home / Self Care | Payer: Self-pay | Source: Home / Self Care | Attending: Obstetrics and Gynecology

## 2022-11-04 DIAGNOSIS — Z3A37 37 weeks gestation of pregnancy: Secondary | ICD-10-CM

## 2022-11-04 DIAGNOSIS — O24420 Gestational diabetes mellitus in childbirth, diet controlled: Secondary | ICD-10-CM

## 2022-11-04 DIAGNOSIS — O134 Gestational [pregnancy-induced] hypertension without significant proteinuria, complicating childbirth: Secondary | ICD-10-CM

## 2022-11-04 DIAGNOSIS — O24429 Gestational diabetes mellitus in childbirth, unspecified control: Secondary | ICD-10-CM

## 2022-11-04 LAB — GLUCOSE, CAPILLARY
Glucose-Capillary: 131 mg/dL — ABNORMAL HIGH (ref 70–99)
Glucose-Capillary: 97 mg/dL (ref 70–99)
Glucose-Capillary: 86 mg/dL (ref 70–99)
Glucose-Capillary: 83 mg/dL (ref 70–99)
Glucose-Capillary: 120 mg/dL — ABNORMAL HIGH (ref 70–99)
Glucose-Capillary: 104 mg/dL — ABNORMAL HIGH (ref 70–99)

## 2022-11-04 LAB — CBC
HCT: 29.9 % — ABNORMAL LOW (ref 36.0–46.0)
HCT: 28.7 % — ABNORMAL LOW (ref 36.0–46.0)
Hemoglobin: 10 g/dL — ABNORMAL LOW (ref 12.0–15.0)
Hemoglobin: 9.5 g/dL — ABNORMAL LOW (ref 12.0–15.0)
MCH: 29.5 pg (ref 26.0–34.0)
MCH: 29.6 pg (ref 26.0–34.0)
MCHC: 33.4 g/dL (ref 30.0–36.0)
MCHC: 33.1 g/dL (ref 30.0–36.0)
MCV: 88.2 fL (ref 80.0–100.0)
MCV: 89.4 fL (ref 80.0–100.0)
Platelets: 260 10*3/uL (ref 150–400)
Platelets: 269 10*3/uL (ref 150–400)
RBC: 3.39 MIL/uL — ABNORMAL LOW (ref 3.87–5.11)
RBC: 3.21 MIL/uL — ABNORMAL LOW (ref 3.87–5.11)
RDW: 13.3 % (ref 11.5–15.5)
RDW: 13.3 % (ref 11.5–15.5)
WBC: 10.1 10*3/uL (ref 4.0–10.5)
WBC: 15.2 10*3/uL — ABNORMAL HIGH (ref 4.0–10.5)
nRBC: 0 % (ref 0.0–0.2)
nRBC: 0 % (ref 0.0–0.2)

## 2022-11-04 SURGERY — Surgical Case
Anesthesia: Epidural | Site: Abdomen

## 2022-11-04 MED ORDER — LIDOCAINE HCL (PF) 1 % IJ SOLN
INTRAMUSCULAR | Status: DC | PRN
  Administered 2022-11-04: 5 mL via EPIDURAL
  Administered 2022-11-04: 4 mL via EPIDURAL

## 2022-11-04 MED ORDER — DIPHENHYDRAMINE HCL 25 MG PO CAPS: 25.0000 mg | ORAL_CAPSULE | Freq: Four times a day (QID) | ORAL | Status: DC | PRN

## 2022-11-04 MED ORDER — MORPHINE SULFATE (PF) 0.5 MG/ML IJ SOLN
INTRAMUSCULAR | Status: AC
  Filled 2022-11-04: qty 10

## 2022-11-04 MED ORDER — MENTHOL 3 MG MT LOZG: 1.0000 | LOZENGE | OROMUCOSAL | Status: DC | PRN

## 2022-11-04 MED ORDER — PROMETHAZINE HCL 25 MG/ML IJ SOLN: 6.2500 mg | INTRAMUSCULAR | Status: DC | PRN

## 2022-11-04 MED ORDER — KETOROLAC TROMETHAMINE 30 MG/ML IJ SOLN
INTRAMUSCULAR | Status: AC
  Filled 2022-11-04: qty 1

## 2022-11-04 MED ORDER — KETOROLAC TROMETHAMINE 30 MG/ML IJ SOLN
30.0000 mg | Freq: Four times a day (QID) | INTRAMUSCULAR | Status: AC | PRN
  Administered 2022-11-04 – 2022-11-05 (×2): 30 mg via INTRAVENOUS
  Filled 2022-11-04: qty 1

## 2022-11-04 MED ORDER — CEFAZOLIN SODIUM-DEXTROSE 2-3 GM-%(50ML) IV SOLR
INTRAVENOUS | Status: DC | PRN
  Administered 2022-11-04: 2 g via INTRAVENOUS

## 2022-11-04 MED ORDER — OXYTOCIN-SODIUM CHLORIDE 30-0.9 UT/500ML-% IV SOLN
INTRAVENOUS | Status: DC | PRN
  Administered 2022-11-04: 300 mL via INTRAVENOUS

## 2022-11-04 MED ORDER — SENNOSIDES-DOCUSATE SODIUM 8.6-50 MG PO TABS
2.0000 | ORAL_TABLET | Freq: Every day | ORAL | Status: DC
  Administered 2022-11-05 – 2022-11-06 (×2): 2 via ORAL
  Filled 2022-11-04 (×2): qty 2

## 2022-11-04 MED ORDER — NIFEDIPINE ER OSMOTIC RELEASE 30 MG PO TB24
30.0000 mg | ORAL_TABLET | Freq: Every day | ORAL | Status: DC
  Administered 2022-11-05 – 2022-11-06 (×2): 30 mg via ORAL
  Filled 2022-11-04 (×2): qty 1

## 2022-11-04 MED ORDER — TERBUTALINE SULFATE 1 MG/ML IJ SOLN: 0.2500 mg | Freq: Once | INTRAMUSCULAR | Status: DC | PRN

## 2022-11-04 MED ORDER — ACETAMINOPHEN 10 MG/ML IV SOLN
INTRAVENOUS | Status: DC | PRN
  Administered 2022-11-04: 1000 mg via INTRAVENOUS

## 2022-11-04 MED ORDER — SIMETHICONE 80 MG PO CHEW
80.0000 mg | CHEWABLE_TABLET | Freq: Three times a day (TID) | ORAL | Status: DC
  Administered 2022-11-05 – 2022-11-06 (×3): 80 mg via ORAL
  Filled 2022-11-04 (×3): qty 1

## 2022-11-04 MED ORDER — OXYCODONE HCL 5 MG PO TABS
5.0000 mg | ORAL_TABLET | ORAL | Status: DC | PRN
  Administered 2022-11-05 (×2): 5 mg via ORAL
  Administered 2022-11-05: 10 mg via ORAL
  Administered 2022-11-05: 5 mg via ORAL
  Administered 2022-11-06: 10 mg via ORAL
  Administered 2022-11-06 (×2): 5 mg via ORAL
  Filled 2022-11-04 (×5): qty 1
  Filled 2022-11-04: qty 2
  Filled 2022-11-04 (×2): qty 1

## 2022-11-04 MED ORDER — PRENATAL MULTIVITAMIN CH
1.0000 | ORAL_TABLET | Freq: Every day | ORAL | Status: DC
  Administered 2022-11-05 – 2022-11-06 (×2): 1 via ORAL
  Filled 2022-11-04 (×2): qty 1

## 2022-11-04 MED ORDER — SODIUM CHLORIDE 0.9% FLUSH: 3.0000 mL | INTRAVENOUS | Status: DC | PRN

## 2022-11-04 MED ORDER — DEXAMETHASONE SODIUM PHOSPHATE 10 MG/ML IJ SOLN
INTRAMUSCULAR | Status: DC | PRN
  Administered 2022-11-04: 10 mg via INTRAVENOUS

## 2022-11-04 MED ORDER — SIMETHICONE 80 MG PO CHEW: 80.0000 mg | CHEWABLE_TABLET | ORAL | Status: DC | PRN

## 2022-11-04 MED ORDER — KETOROLAC TROMETHAMINE 30 MG/ML IJ SOLN: 30.0000 mg | Freq: Four times a day (QID) | INTRAMUSCULAR | Status: AC | PRN

## 2022-11-04 MED ORDER — LACTATED RINGERS IV SOLN: INTRAVENOUS | Status: DC | PRN

## 2022-11-04 MED ORDER — WITCH HAZEL-GLYCERIN EX PADS: 1.0000 | MEDICATED_PAD | CUTANEOUS | Status: DC | PRN

## 2022-11-04 MED ORDER — DIPHENHYDRAMINE HCL 25 MG PO CAPS: 25.0000 mg | ORAL_CAPSULE | ORAL | Status: DC | PRN

## 2022-11-04 MED ORDER — CEFAZOLIN SODIUM-DEXTROSE 2-4 GM/100ML-% IV SOLN: 2.0000 g | INTRAVENOUS | Status: DC

## 2022-11-04 MED ORDER — SCOPOLAMINE 1 MG/3DAYS TD PT72: 1.0000 | MEDICATED_PATCH | Freq: Once | TRANSDERMAL | Status: DC

## 2022-11-04 MED ORDER — LACTATED RINGERS IV SOLN: INTRAVENOUS | Status: DC

## 2022-11-04 MED ORDER — OXYCODONE HCL 5 MG/5ML PO SOLN: 5.0000 mg | Freq: Once | ORAL | Status: DC | PRN

## 2022-11-04 MED ORDER — OXYTOCIN-SODIUM CHLORIDE 30-0.9 UT/500ML-% IV SOLN: 2.5000 [IU]/h | INTRAVENOUS | Status: AC

## 2022-11-04 MED ORDER — ONDANSETRON HCL 4 MG/2ML IJ SOLN: 4.0000 mg | Freq: Three times a day (TID) | INTRAMUSCULAR | Status: DC | PRN

## 2022-11-04 MED ORDER — SODIUM CHLORIDE 0.9 % IV SOLN
500.0000 mg | Freq: Once | INTRAVENOUS | Status: AC
  Administered 2022-11-04: 500 mg via INTRAVENOUS

## 2022-11-04 MED ORDER — SOD CITRATE-CITRIC ACID 500-334 MG/5ML PO SOLN: 30.0000 mL | ORAL | Status: DC

## 2022-11-04 MED ORDER — DIPHENHYDRAMINE HCL 50 MG/ML IJ SOLN
12.5000 mg | INTRAMUSCULAR | Status: DC | PRN
  Administered 2022-11-04: 12.5 mg via INTRAVENOUS
  Filled 2022-11-04: qty 1

## 2022-11-04 MED ORDER — MEPERIDINE HCL 25 MG/ML IJ SOLN: 6.2500 mg | INTRAMUSCULAR | Status: DC | PRN

## 2022-11-04 MED ORDER — FENTANYL CITRATE (PF) 100 MCG/2ML IJ SOLN
INTRAMUSCULAR | Status: AC
  Filled 2022-11-04: qty 2

## 2022-11-04 MED ORDER — OXYTOCIN-SODIUM CHLORIDE 30-0.9 UT/500ML-% IV SOLN
1.0000 m[IU]/min | INTRAVENOUS | Status: DC
  Administered 2022-11-04: 2 m[IU]/min via INTRAVENOUS
  Filled 2022-11-04: qty 500

## 2022-11-04 MED ORDER — OXYCODONE HCL 5 MG PO TABS: 5.0000 mg | ORAL_TABLET | Freq: Once | ORAL | Status: DC | PRN

## 2022-11-04 MED ORDER — DIBUCAINE (PERIANAL) 1 % EX OINT: 1.0000 | TOPICAL_OINTMENT | CUTANEOUS | Status: DC | PRN

## 2022-11-04 MED ORDER — MORPHINE SULFATE (PF) 0.5 MG/ML IJ SOLN
INTRAMUSCULAR | Status: DC | PRN
  Administered 2022-11-04: 3 mg via EPIDURAL

## 2022-11-04 MED ORDER — ONDANSETRON HCL 4 MG/2ML IJ SOLN
INTRAMUSCULAR | Status: DC | PRN
  Administered 2022-11-04: 4 mg via INTRAVENOUS

## 2022-11-04 MED ORDER — FENTANYL CITRATE (PF) 100 MCG/2ML IJ SOLN
INTRAMUSCULAR | Status: DC | PRN
  Administered 2022-11-04: 100 ug via EPIDURAL

## 2022-11-04 MED ORDER — ZOLPIDEM TARTRATE 5 MG PO TABS: 5.0000 mg | ORAL_TABLET | Freq: Every evening | ORAL | Status: DC | PRN

## 2022-11-04 MED ORDER — NALOXONE HCL 0.4 MG/ML IJ SOLN: 0.4000 mg | INTRAMUSCULAR | Status: DC | PRN

## 2022-11-04 MED ORDER — ACETAMINOPHEN 500 MG PO TABS
1000.0000 mg | ORAL_TABLET | Freq: Four times a day (QID) | ORAL | Status: DC
  Administered 2022-11-05 – 2022-11-06 (×6): 1000 mg via ORAL
  Filled 2022-11-04 (×7): qty 2

## 2022-11-04 MED ORDER — FENTANYL CITRATE (PF) 100 MCG/2ML IJ SOLN: 25.0000 ug | INTRAMUSCULAR | Status: DC | PRN

## 2022-11-04 MED ORDER — POVIDONE-IODINE 10 % EX SWAB: 2.0000 | Freq: Once | CUTANEOUS | Status: DC

## 2022-11-04 MED ORDER — LIDOCAINE-EPINEPHRINE (PF) 2 %-1:200000 IJ SOLN
INTRAMUSCULAR | Status: DC | PRN
  Administered 2022-11-04 (×3): 5 mL via EPIDURAL

## 2022-11-04 MED ORDER — HYDROMORPHONE HCL 1 MG/ML IJ SOLN
0.2000 mg | INTRAMUSCULAR | Status: DC | PRN
  Administered 2022-11-04: 0.2 mg via INTRAVENOUS
  Filled 2022-11-04: qty 1

## 2022-11-04 MED ORDER — NALOXONE HCL 4 MG/10ML IJ SOLN: 1.0000 ug/kg/h | INTRAVENOUS | Status: DC | PRN

## 2022-11-04 MED ORDER — COCONUT OIL OIL: 1.0000 | TOPICAL_OIL | Status: DC | PRN

## 2022-11-04 SURGICAL SUPPLY — 33 items
ADH SKN CLS APL DERMABOND .7 (GAUZE/BANDAGES/DRESSINGS)
APL PRP STRL LF DISP 70% ISPRP (MISCELLANEOUS) ×2
APL SKNCLS STERI-STRIP NONHPOA (GAUZE/BANDAGES/DRESSINGS) ×1
BENZOIN TINCTURE PRP APPL 2/3 (GAUZE/BANDAGES/DRESSINGS) IMPLANT
CHLORAPREP W/TINT 26 (MISCELLANEOUS) ×4 IMPLANT
CLAMP UMBILICAL CORD (MISCELLANEOUS) ×2 IMPLANT
CLOTH BEACON ORANGE TIMEOUT ST (SAFETY) ×2 IMPLANT
CLSR STERI-STRIP ANTIMIC 1/2X4 (GAUZE/BANDAGES/DRESSINGS) IMPLANT
DERMABOND ADVANCED .7 DNX12 (GAUZE/BANDAGES/DRESSINGS) IMPLANT
DRSG OPSITE POSTOP 4X10 (GAUZE/BANDAGES/DRESSINGS) ×2 IMPLANT
ELECT REM PT RETURN 9FT ADLT (ELECTROSURGICAL) ×1
ELECTRODE REM PT RTRN 9FT ADLT (ELECTROSURGICAL) ×2 IMPLANT
EXTRACTOR VACUUM M CUP 4 TUBE (SUCTIONS) IMPLANT
GLOVE BIO SURGEON STRL SZ7.5 (GLOVE) ×2 IMPLANT
GLOVE BIOGEL PI IND STRL 7.0 (GLOVE) ×2 IMPLANT
GOWN STRL REUS W/TWL LRG LVL3 (GOWN DISPOSABLE) ×4 IMPLANT
KIT ABG SYR 3ML LUER SLIP (SYRINGE) ×2 IMPLANT
NDL HYPO 25X5/8 SAFETYGLIDE (NEEDLE) ×2 IMPLANT
NEEDLE HYPO 25X5/8 SAFETYGLIDE (NEEDLE) ×1 IMPLANT
NS IRRIG 1000ML POUR BTL (IV SOLUTION) ×2 IMPLANT
PACK C SECTION WH (CUSTOM PROCEDURE TRAY) ×2 IMPLANT
PAD OB MATERNITY 4.3X12.25 (PERSONAL CARE ITEMS) ×2 IMPLANT
STRIP CLOSURE SKIN 1/2X4 (GAUZE/BANDAGES/DRESSINGS) IMPLANT
SUT MNCRL 0 VIOLET CTX 36 (SUTURE) ×8 IMPLANT
SUT PDS AB 0 CTX 60 (SUTURE) ×2 IMPLANT
SUT PLAIN 0 NONE (SUTURE) IMPLANT
SUT PLAIN 2 0 (SUTURE)
SUT PLAIN 2 0 XLH (SUTURE) IMPLANT
SUT PLAIN ABS 2-0 CT1 27XMFL (SUTURE) IMPLANT
SUT VIC AB 4-0 KS 27 (SUTURE) ×2 IMPLANT
TOWEL OR 17X24 6PK STRL BLUE (TOWEL DISPOSABLE) ×2 IMPLANT
TRAY FOLEY W/BAG SLVR 14FR LF (SET/KITS/TRAYS/PACK) ×2 IMPLANT
WATER STERILE IRR 1000ML POUR (IV SOLUTION) ×2 IMPLANT

## 2022-11-04 NOTE — Progress Notes (Signed)
Pt c/o pain 10/10 throbbing at incision. States moved too quickly when dealing with newborn. Pt requesting pain medication but had IV tylenol at 1800, toradol at 1957. Dr. Mal Amabile called and states to give pt prn dilaudid as ordered.

## 2022-11-04 NOTE — Progress Notes (Signed)
IUPC placed

## 2022-11-04 NOTE — Plan of Care (Signed)
Problem: Education: Goal: Ability to describe self-care measures that may prevent or decrease complications (Diabetes Survival Skills Education) will improve Outcome: Completed/Met Goal: Individualized Educational Video(s) Outcome: Completed/Met   Problem: Coping: Goal: Ability to adjust to condition or change in health will improve Outcome: Completed/Met   Problem: Fluid Volume: Goal: Ability to maintain a balanced intake and output will improve Outcome: Completed/Met   Problem: Health Behavior/Discharge Planning: Goal: Ability to identify and utilize available resources and services will improve Outcome: Completed/Met Goal: Ability to manage health-related needs will improve Outcome: Completed/Met   Problem: Metabolic: Goal: Ability to maintain appropriate glucose levels will improve Outcome: Completed/Met   Problem: Nutritional: Goal: Maintenance of adequate nutrition will improve Outcome: Completed/Met Goal: Progress toward achieving an optimal weight will improve Outcome: Completed/Met   Problem: Skin Integrity: Goal: Risk for impaired skin integrity will decrease Outcome: Completed/Met   Problem: Tissue Perfusion: Goal: Adequacy of tissue perfusion will improve Outcome: Completed/Met   Problem: Education: Goal: Knowledge of Childbirth will improve Outcome: Completed/Met Goal: Ability to make informed decisions regarding treatment and plan of care will improve Outcome: Completed/Met Goal: Ability to state and carry out methods to decrease the pain will improve Outcome: Completed/Met Goal: Individualized Educational Video(s) Outcome: Completed/Met   Problem: Coping: Goal: Ability to verbalize concerns and feelings about labor and delivery will improve Outcome: Completed/Met   Problem: Life Cycle: Goal: Ability to make normal progression through stages of labor will improve Outcome: Completed/Met Goal: Ability to effectively push during vaginal delivery  will improve Outcome: Completed/Met   Problem: Role Relationship: Goal: Will demonstrate positive interactions with the child Outcome: Completed/Met   Problem: Safety: Goal: Risk of complications during labor and delivery will decrease Outcome: Completed/Met   Problem: Pain Management: Goal: Relief or control of pain from uterine contractions will improve Outcome: Completed/Met   Problem: Education: Goal: Knowledge of General Education information will improve Description: Including pain rating scale, medication(s)/side effects and non-pharmacologic comfort measures Outcome: Completed/Met   Problem: Health Behavior/Discharge Planning: Goal: Ability to manage health-related needs will improve Outcome: Completed/Met   Problem: Clinical Measurements: Goal: Ability to maintain clinical measurements within normal limits will improve Outcome: Completed/Met Goal: Will remain free from infection Outcome: Completed/Met Goal: Diagnostic test results will improve Outcome: Completed/Met Goal: Respiratory complications will improve Outcome: Completed/Met Goal: Cardiovascular complication will be avoided Outcome: Completed/Met   Problem: Activity: Goal: Risk for activity intolerance will decrease Outcome: Completed/Met   Problem: Nutrition: Goal: Adequate nutrition will be maintained Outcome: Completed/Met   Problem: Coping: Goal: Level of anxiety will decrease Outcome: Completed/Met   Problem: Elimination: Goal: Will not experience complications related to bowel motility Outcome: Completed/Met Goal: Will not experience complications related to urinary retention Outcome: Completed/Met   Problem: Pain Managment: Goal: General experience of comfort will improve Outcome: Completed/Met   Problem: Safety: Goal: Ability to remain free from injury will improve Outcome: Completed/Met   Problem: Skin Integrity: Goal: Risk for impaired skin integrity will decrease Outcome:  Completed/Met   Problem: Education: Goal: Knowledge of Childbirth will improve Outcome: Completed/Met Goal: Ability to make informed decisions regarding treatment and plan of care will improve Outcome: Completed/Met Goal: Ability to state and carry out methods to decrease the pain will improve Outcome: Completed/Met Goal: Individualized Educational Video(s) Outcome: Completed/Met   Problem: Coping: Goal: Ability to verbalize concerns and feelings about labor and delivery will improve Outcome: Completed/Met   Problem: Life Cycle: Goal: Ability to make normal progression through stages of labor will improve Outcome: Completed/Met Goal: Ability  to effectively push during vaginal delivery will improve Outcome: Completed/Met   Problem: Role Relationship: Goal: Will demonstrate positive interactions with the child Outcome: Completed/Met   Problem: Safety: Goal: Risk of complications during labor and delivery will decrease Outcome: Completed/Met   Problem: Pain Management: Goal: Relief or control of pain from uterine contractions will improve Outcome: Completed/Met   Problem: Education: Goal: Knowledge of disease or condition will improve Outcome: Completed/Met Goal: Knowledge of the prescribed therapeutic regimen will improve Outcome: Completed/Met   Problem: Fluid Volume: Goal: Peripheral tissue perfusion will improve Outcome: Completed/Met   Problem: Clinical Measurements: Goal: Complications related to disease process, condition or treatment will be avoided or minimized Outcome: Completed/Met

## 2022-11-04 NOTE — Brief Op Note (Signed)
11/04/2022  6:26 PM  PATIENT:  Abigail Gray  32 y.o. female  PRE-OPERATIVE DIAGNOSIS:  Primary Cesarean for failure to progress  POST-OPERATIVE DIAGNOSIS:  Primary Cesarean for failure to progress  PROCEDURE:  Procedure(s): CESAREAN SECTION (N/A)  SURGEON:  Surgeon(s) and Role:    * Harold Hedge, MD - Primary    * Cresenzo, Cyndi Lennert, MD - Assisting  PHYSICIAN ASSISTANT:   ASSISTANTS:   ANESTHESIA:   epidural  EBL:  per anesthesia note   BLOOD ADMINISTERED:none  DRAINS: Urinary Catheter (Foley)   LOCAL MEDICATIONS USED:    SPECIMEN:  No Specimen  DISPOSITION OF SPECIMEN:  N/A  COUNTS:  YES  TOURNIQUET:  * No tourniquets in log *  DICTATION: .Other Dictation: Dictation Number [id number]  PLAN OF CARE: Admit to inpatient   PATIENT DISPOSITION:  PACU - hemodynamically stable.   Delay start of Pharmacological VTE agent (>24hrs) due to surgical blood loss or risk of bleeding: not applicable

## 2022-11-04 NOTE — Progress Notes (Signed)
SVE conducted and no cervical change noted. Patient remains 2-2.5/50/-3. Dr Henderson Cloud notified. Orders received to start low dose oxytocin 2 x 2 per protocol. Patient may have epidural. CBC ordered for epidural for patient.

## 2022-11-04 NOTE — Progress Notes (Signed)
FHT cat one UCs MVU 220 Patient voiding on bedpan-was unable to tolerate foley (Hx of irritative voiding pattern). Bladder scan C/S about 60-120 ml Straight cath> 60mg  of dilute urine. She tolerated st cath well Cx 3/50/-3. Patient C/O back pain, tired D/W lack of descent of vtx and D/W options of continued labor vs C/S She wants C/S D/W procedure and risks including infection, organ damage, bleeding/transfusion-HIV/Hep, DVT/PE, pneumonia, wound break down. She states she understands and agrees

## 2022-11-04 NOTE — Progress Notes (Addendum)
Patient blood glucose 131. Patient stated she drunk a soda and ate a few saltine crackers earlier. Discussed with patient carbohydrate intake and will retake her BS in one hour.   0330 blood glucose 97

## 2022-11-04 NOTE — Anesthesia Procedure Notes (Signed)
Epidural Patient location during procedure: OB Start time: 11/04/2022 8:20 AM End time: 11/04/2022 8:24 AM  Staffing Anesthesiologist: Beryle Lathe, MD Performed: anesthesiologist   Preanesthetic Checklist Completed: patient identified, IV checked, risks and benefits discussed, monitors and equipment checked, pre-op evaluation and timeout performed  Epidural Patient position: sitting Prep: DuraPrep Patient monitoring: continuous pulse ox and blood pressure Approach: midline Location: L3-L4 Injection technique: LOR saline  Needle:  Needle type: Tuohy  Needle gauge: 17 G Needle length: 9 cm Needle insertion depth: 7 cm Catheter size: 19 Gauge Catheter at skin depth: 12 cm Test dose: negative and Other (1% lidocaine)  Assessment Events: blood not aspirated and no cerebrospinal fluid  Additional Notes Patient identified. Risks including, but not limited to, bleeding, infection, nerve damage, paralysis, inadequate analgesia, blood pressure changes, nausea, vomiting, allergic reaction, postpartum back pain, itching, and headache were discussed. Patient expressed understanding and wished to proceed. Sterile prep and drape, including hand hygiene, mask, and sterile gloves were used. The patient was positioned and the spine was prepped. The skin was anesthetized with lidocaine. No paraesthesia or other complication noted. The patient did not experience any signs of intravascular injection such as tinnitus or metallic taste in mouth, nor signs of intrathecal spread such as rapid motor block. Please see nursing notes for vital signs. The patient tolerated the procedure well.   Leslye Peer, MDReason for block:procedure for pain

## 2022-11-04 NOTE — Anesthesia Postprocedure Evaluation (Signed)
Anesthesia Post Note  Patient: Abigail Gray  Procedure(s) Performed: CESAREAN SECTION (Abdomen)     Patient location during evaluation: PACU Anesthesia Type: Epidural Level of consciousness: awake and alert Pain management: pain level controlled Vital Signs Assessment: post-procedure vital signs reviewed and stable Respiratory status: spontaneous breathing, respiratory function stable and nonlabored ventilation Cardiovascular status: blood pressure returned to baseline Postop Assessment: epidural receding and no apparent nausea or vomiting Anesthetic complications: no   No notable events documented.  Last Vitals:  Vitals:   11/04/22 1856 11/04/22 1900  BP:  109/80  Pulse: 76 88  Resp: 16 14  Temp:    SpO2: 98% 100%      Pain Goal: Patients Stated Pain Goal: 2 (11/03/22 1930)  LLE Motor Response: Purposeful movement (11/04/22 1939) LLE Sensation: Tingling (11/04/22 1939) RLE Motor Response: Purposeful movement (11/04/22 1939) RLE Sensation: Tingling (11/04/22 1939)     Epidural/Spinal Function Cutaneous sensation: Able to Wiggle Toes (11/04/22 1939), Patient able to flex knees: Yes (11/04/22 1939), Patient able to lift hips off bed: Yes (11/04/22 1939), Back pain beyond tenderness at insertion site: No (11/04/22 1939), Progressively worsening motor and/or sensory loss: No (11/04/22 1939), Bowel and/or bladder incontinence post epidural: No (11/04/22 1939)  Beryle Lathe

## 2022-11-04 NOTE — Op Note (Unsigned)
NAME: Abigail Gray, Abigail Gray MEDICAL RECORD NO: [id number] ACCOUNT NO: [id number] DATE OF BIRTH: 1990/10/20 FACILITY: [facility] LOCATION: [facility] PHYSICIAN: Guy Sandifer. Arleta Creek, MD  Operative Report   DATE OF PROCEDURE: 11/04/2022  PREOPERATIVE DIAGNOSIS:  Failure to progress.  POSTOPERATIVE DIAGNOSIS:  Failure to progress.  PROCEDURE:  Primary low transverse cesarean section.  SURGEON:  Harold Hedge, M.D.  ASSISTANT:  Marcheta Grammes, M.D.An experienced assistant was required given the standard of surgical care given the complexity of the case.  This assistant was needed for exposure, dissection, suctioning, retraction, instrument exchange, assisting with delivery with administration of fundal pressure, and for overall help during the procedure    ANESTHESIA:  Epidural.  ESTIMATED BLOOD LOSS:  Per anesthesia note.  FINDINGS:  Viable female infant, Apgars, birth weight pending.  INDICATIONS AND CONSENT:  This patient is a 32 year old G1 P0 at 85 and 1/7th weeks, admitted for induction of labor for gestational hypertension and gestational diabetes.  After 36 hours, the vertex remains very high and there is no progress.  Cesarean  section was discussed preoperatively including risks with complications, and options were discussed.  The patient requests cesarean section.  Potential risks and complications were discussed preoperatively including but not limited to infection, organ  damage, bleeding requiring transfusion of blood products with HIV and hepatitis acquisition, DVT, PE, pneumonia, wound breakdown.  The patient states she understands and agrees and consent is signed on the chart.  DESCRIPTION OF PROCEDURE:  The patient was taken to the operating room where she is identified.  Epidural anesthetic was augmented to a surgical level.  She was prepped vaginally with Hibiclens.  Foley catheter was placed and she was prepped abdominally  with ChloraPrep.  The pannus was elevated with a traxi.  A  timeout was done.  After a 3-minute drying time, she was draped in a sterile fashion.  After testing for adequate epidural anesthesia, skin was entered through a Pfannenstiel incision and  dissection was carried out in layers to the peritoneum, which was entered and taken down superiorly and inferiorly.  Vesicouterine peritoneum was taken down cephalad laterally.  Uterus was incised in a low transverse manner and the uterine cavity was  entered bluntly with a hemostat.  The uterine incision was extended bilaterally with the fingers.  Baby was then delivered from the vertex position without difficulty.  Good cry and tone are noted.  After 1 minute, cord was clamped and cut and the baby  was handed to waiting pediatrics team.  Placenta was delivered.  Uterine cavity is clean.  Uterus was closed in two running locking imbricating layers of 0 Monocryl suture.  Lavage was carried out and good hemostasis was noted.  Anterior peritoneum was  closed in running fashion with 0 Monocryl suture which was also used to reapproximate the pyramidalis muscle in the midline.  Anterior rectus fascia was closed in running fashion with a 0 looped PDS suture.  Subcutaneous layer was closed with interrupted  plain and the skin was closed in a subcuticular fashion with a 4-0 Vicryl on a Keith needle.  Benzoin, Steri-Strips, honeycomb and pressure dressing are applied.  All counts were correct.  The patient was taken to recovery room in stable condition.   MUK D: 11/04/2022 6:32:11 pm T: 11/04/2022 9:59:00 pm  JOB: [id number]/ [id number]

## 2022-11-04 NOTE — Transfer of Care (Signed)
Immediate Anesthesia Transfer of Care Note  Patient: Abigail Gray  Procedure(s) Performed: CESAREAN SECTION (Abdomen)  Patient Location: PACU  Anesthesia Type:Epidural  Level of Consciousness: awake, alert , and oriented  Airway & Oxygen Therapy: Patient Spontanous Breathing  Post-op Assessment: Report given to RN and Post -op Vital signs reviewed and stable  Post vital signs: Reviewed and stable  Last Vitals:  Vitals Value Taken Time  BP 118/71 11/04/22 1854  Temp 36.6 C 11/04/22 1854  Pulse 91 11/04/22 1858  Resp 17 11/04/22 1858  SpO2 99 % 11/04/22 1858  Vitals shown include unvalidated device data.  Last Pain:  Vitals:   11/04/22 1854  TempSrc: Oral  PainSc:       Patients Stated Pain Goal: 2 (11/03/22 1930)  Complications: No notable events documented.

## 2022-11-04 NOTE — Progress Notes (Signed)
At bedside palpating contractions that are occurring every 4-6 minutes.. Contractions palpate mild with adequate resting time and tone. Patient states contractions where occurring every 20 minutes after dose given @ 2130 but contraction frequency has increased since subsequent dose given at 0230. Toco readjusted to trace contractions.

## 2022-11-04 NOTE — Progress Notes (Signed)
Cx 2-3/30/-3/ballotable/vtx FHT cat one Ucs irritability  A/P: D/W patient>start pitocin, epidural prn She states she understands and agrees

## 2022-11-04 NOTE — Progress Notes (Signed)
FHT cat one UCs q1-4 min Cx 2-3/30/-3/vtx AROM clear

## 2022-11-04 NOTE — Anesthesia Preprocedure Evaluation (Addendum)
Anesthesia Evaluation  Patient identified by MRN, date of birth, ID band Patient awake    Reviewed: Allergy & Precautions, NPO status , Patient's Chart, lab work & pertinent test results  History of Anesthesia Complications Negative for: history of anesthetic complications  Airway Mallampati: III   Neck ROM: Full    Dental   Pulmonary former smoker   Pulmonary exam normal        Cardiovascular hypertension, Pt. on medications Normal cardiovascular exam   Chart states chronic HTN. Patient says she has no hx of HTN and it only started a few weeks ago, at which point she was started on medication    Neuro/Psych  Headaches PSYCHIATRIC DISORDERS  Depression       GI/Hepatic negative GI ROS, Neg liver ROS,,,  Endo/Other    Morbid obesity K 2.9   Renal/GU negative Renal ROS     Musculoskeletal negative musculoskeletal ROS (+)    Abdominal  (+) + obese  Peds  Hematology  (+) Blood dyscrasia, anemia  Plt 260k    Anesthesia Other Findings   Reproductive/Obstetrics (+) Pregnancy                             Anesthesia Physical Anesthesia Plan  ASA: 3  Anesthesia Plan: Epidural   Post-op Pain Management:    Induction:   PONV Risk Score and Plan: 2 and Treatment may vary due to age or medical condition  Airway Management Planned: Natural Airway  Additional Equipment: None  Intra-op Plan:   Post-operative Plan:   Informed Consent: I have reviewed the patients History and Physical, chart, labs and discussed the procedure including the risks, benefits and alternatives for the proposed anesthesia with the patient or authorized representative who has indicated his/her understanding and acceptance.       Plan Discussed with: Anesthesiologist  Anesthesia Plan Comments: (Labs reviewed. Platelets acceptable, patient not taking any blood thinning medications. Per RN, FHR tracing reported  to be stable enough for sitting procedure. Risks and benefits discussed with patient, including PDPH, backache, epidural hematoma, failed epidural, blood pressure changes, allergic reaction, and nerve injury. Patient expressed understanding and wished to proceed.  Update: OB called for c/s due to failure to progress. Epidural has been functioning well for labor, will plan to use for C/S. Discussed with patient.)       Anesthesia Quick Evaluation

## 2022-11-05 LAB — CBC
HCT: 23.6 % — ABNORMAL LOW (ref 36.0–46.0)
Hemoglobin: 7.9 g/dL — ABNORMAL LOW (ref 12.0–15.0)
MCH: 29.2 pg (ref 26.0–34.0)
MCHC: 33.5 g/dL (ref 30.0–36.0)
MCV: 87.1 fL (ref 80.0–100.0)
Platelets: 273 10*3/uL (ref 150–400)
RBC: 2.71 MIL/uL — ABNORMAL LOW (ref 3.87–5.11)
RDW: 13.3 % (ref 11.5–15.5)
WBC: 13.2 10*3/uL — ABNORMAL HIGH (ref 4.0–10.5)
nRBC: 0 % (ref 0.0–0.2)

## 2022-11-05 LAB — COMPREHENSIVE METABOLIC PANEL
ALT: 14 U/L (ref 0–44)
AST: 22 U/L (ref 15–41)
Albumin: 2.1 g/dL — ABNORMAL LOW (ref 3.5–5.0)
Alkaline Phosphatase: 121 U/L (ref 38–126)
Anion gap: 8 (ref 5–15)
BUN: 9 mg/dL (ref 6–20)
CO2: 22 mmol/L (ref 22–32)
Calcium: 8.4 mg/dL — ABNORMAL LOW (ref 8.9–10.3)
Chloride: 104 mmol/L (ref 98–111)
Creatinine, Ser: 0.61 mg/dL (ref 0.44–1.00)
GFR, Estimated: >60 mL/min (ref >60)
Glucose, Bld: 131 mg/dL — ABNORMAL HIGH (ref 70–99)
Potassium: 3.9 mmol/L (ref 3.5–5.1)
Sodium: 134 mmol/L — ABNORMAL LOW (ref 135–145)
Total Bilirubin: 0.4 mg/dL (ref 0.3–1.2)
Total Protein: 5.2 g/dL — ABNORMAL LOW (ref 6.5–8.1)

## 2022-11-05 LAB — GLUCOSE, CAPILLARY: Glucose-Capillary: 89 mg/dL (ref 70–99)

## 2022-11-05 MED ORDER — EPINEPHRINE PF 1 MG/ML IJ SOLN: 0.3000 mg | Freq: Once | INTRAMUSCULAR | Status: DC | PRN

## 2022-11-05 MED ORDER — SODIUM CHLORIDE 0.9 % IV SOLN: INTRAVENOUS | Status: DC | PRN

## 2022-11-05 MED ORDER — ALBUTEROL SULFATE (2.5 MG/3ML) 0.083% IN NEBU: 2.5000 mg | INHALATION_SOLUTION | Freq: Once | RESPIRATORY_TRACT | Status: DC | PRN

## 2022-11-05 MED ORDER — METHYLPREDNISOLONE SODIUM SUCC 125 MG IJ SOLR: 125.0000 mg | Freq: Once | INTRAMUSCULAR | Status: DC | PRN

## 2022-11-05 MED ORDER — SODIUM CHLORIDE 0.9 % IV SOLN
500.0000 mg | Freq: Once | INTRAVENOUS | Status: AC
  Administered 2022-11-05: 500 mg via INTRAVENOUS
  Filled 2022-11-05: qty 25

## 2022-11-05 MED ORDER — DIPHENHYDRAMINE HCL 50 MG/ML IJ SOLN: 25.0000 mg | Freq: Once | INTRAMUSCULAR | Status: DC | PRN

## 2022-11-05 MED ORDER — SODIUM CHLORIDE 0.9 % IV BOLUS: 500.0000 mL | Freq: Once | INTRAVENOUS | Status: DC | PRN

## 2022-11-05 MED ORDER — IRON SUCROSE 500 MG IVPB - SIMPLE MED: 500.0000 mg | Freq: Once | INTRAVENOUS | Status: DC

## 2022-11-05 MED ORDER — IBUPROFEN 600 MG PO TABS
600.0000 mg | ORAL_TABLET | Freq: Four times a day (QID) | ORAL | Status: DC | PRN
  Administered 2022-11-05 – 2022-11-06 (×3): 600 mg via ORAL
  Filled 2022-11-05 (×3): qty 1

## 2022-11-05 NOTE — Progress Notes (Signed)
Pt's BPs elevated since up for orthostatics. (143/79 sitting, 145/83 standing at 2345; 142/94 sitting in bed at 0014) Pt up doing multiple things with newborn. Asymptomatic. Dr. Henderson Cloud notified. CMET added to AM labs and RN to call for BP >150/100.

## 2022-11-05 NOTE — Lactation Note (Signed)
This note was copied from a baby's chart. Lactation Consultation Note  Patient Name: Abigail Gray Date: 11/05/2022 Age:32 hours  Per RN Helmut Muster), Birth Parent declined [facility] services on [place].    Maternal Data    Feeding Nipple Type: Slow - flow  LATCH Score                    Lactation Tools Discussed/Used    Interventions    Discharge    Consult Status      Abigail Gray 11/05/2022, 2:34 AM

## 2022-11-05 NOTE — Progress Notes (Signed)
Subjective: Postpartum Day 1: Cesarean Delivery Patient reports incisional pain, tolerating PO, + flatus, and no problems voiding.    Objective: Vital signs in last 24 hours: Temp:  [97.6 F (36.4 C)-98.9 F (37.2 C)] 97.8 F (36.6 C) (05/12 0736) Pulse Rate:  [61-93] 73 (05/12 0736) Resp:  [14-26] 20 (05/12 0736) BP: (103-175)/(56-97) 124/74 (05/12 0736) SpO2:  [95 %-100 %] 100 % (05/12 0736)  Physical Exam:  General: alert, cooperative, and no distress Lochia: appropriate Uterine Fundus: firm Incision: healing well DVT Evaluation: No evidence of DVT seen on physical exam.  Recent Labs    11/04/22 2018 11/05/22 0436  HGB 9.5* 7.9*  HCT 28.7* 23.6*    Assessment/Plan: Status post Cesarean section. Doing well postoperatively.  Continue current care. Postoperative anemia-she has a lot of trouble with constipation when taking oral iron supplements. D/W options>IV Venofer x 1 ordered  Roselle Locus II, MD 11/05/2022, 8:04 AM

## 2022-11-05 NOTE — Clinical Social Work Maternal (Signed)
  CLINICAL SOCIAL WORK MATERNAL/CHILD NOTE  Patient Details  Name: Abigail Gray MRN: [id number] Date of Birth: 09/17/1990  Date:  11/05/2022  Clinical Social Worker Initiating Note:  Abigail Njie, LCSW Date/Time: Initiated:  11/05/22/1324     Child's Name:  Abigail Gray   Biological Parents:  Mother, Father   Need for Interpreter:  None   Reason for Referral:  Behavioral Health Concerns   Address:  [address] [place] [address]    Phone number:  [phone] (home)     Additional phone number:  Household Members/Support Persons (HM/SP):   Household Member/Support Person 1   HM/SP Name Relationship DOB or Age  HM/SP -1 Abigail Gray FOB    HM/SP -2        HM/SP -3        HM/SP -4        HM/SP -5        HM/SP -6        HM/SP -7        HM/SP -8          Natural Supports (not living in the home):  Parent, Spouse/significant other, Friends, Immediate Family   Professional Supports:     Employment: Part-time   Type of Work:     Education:  High school graduate   Homebound arranged:    Financial Resources:  Private Insurance    Other Resources:      Cultural/Religious Considerations Which May Impact Care:   Strengths:  Ability to meet basic needs  , Compliance with medical plan  , Home prepared for child  , Pediatrician chosen   Psychotropic Medications:         Pediatrician:    [place] area  Pediatrician List:   [facility]  [place]    [place]    [place]    [place]    [place]      Pediatrician Fax Number:    Risk Factors/Current Problems:      Cognitive State:  Alert  , Goal Oriented     Mood/Affect:  Relaxed  , Happy     CSW Assessment: CSW met with MOB and FOB at bedside. CSW introduced self and explained role at the hospital. MOB and FOB are excited about there new addition. MOB reports she has not been on her medication since becoming pregnant and she also has not felt like  she has needed it over the last few months. MOB states that her PCP has told her she can resume meds if and when she feels like she needs to. CSW gave MOB postpartum checklist and information about SIDS and baby blues. MOB has carseat and bassinet for baby and expressed they have everything else they need. MOB and FOB did not express any needs or concerns at this time.   CSW Plan/Description:  Perinatal Mood and Anxiety Disorder (PMADs) Education    Abigail Osmun, LCSW 11/05/2022, 1:35 PM 

## 2022-11-06 ENCOUNTER — Encounter (HOSPITAL_COMMUNITY): Payer: Self-pay | Admitting: Obstetrics and Gynecology

## 2022-11-06 MED ORDER — OXYCODONE HCL 5 MG PO TABS: 5.0000 mg | ORAL_TABLET | ORAL | 0 refills | Status: AC | PRN

## 2022-11-06 MED ORDER — ACETAMINOPHEN 325 MG PO TABS: 650.0000 mg | ORAL_TABLET | Freq: Four times a day (QID) | ORAL | 0 refills | Status: DC | PRN

## 2022-11-06 MED ORDER — IBUPROFEN 600 MG PO TABS: 600.0000 mg | ORAL_TABLET | Freq: Four times a day (QID) | ORAL | 0 refills | Status: DC | PRN

## 2022-11-06 NOTE — Discharge Summary (Signed)
Obstetric Discharge Summary  Abigail Gray is a 32 y.o. female that presented on 11/03/2022 for induction of labor for suspected chronic hypertension on procardia.  She was admitted to labor and delivery for her induction, which was complicated by failure to progress. Primary C section was recommended and she delivered a viable female infant on 11/04/2022.  Her postpartum course was uncomplicated and on PPD#2, she reported well controlled pain, spontaneous voiding, ambulating without difficulty, and tolerating PO.  She received IV iron infusion for acute blood loss anemia. She was stable for discharge home on 11/06/22 with plans for in-office follow up.  Hemoglobin  Date Value Ref Range Status  11/05/2022 7.9 (L) 12.0 - 15.0 g/dL Final   HCT  Date Value Ref Range Status  11/05/2022 23.6 (L) 36.0 - 46.0 % Final    Physical Exam:  General: alert and no distress Lochia: appropriate Uterine Fundus: firm Incision: healing well DVT Evaluation: No evidence of DVT seen on physical exam.  Discharge Diagnoses: Term Pregnancy-delivered, hypertension affecting pregnancy.   Discharge Information: Date: 11/06/2022 Activity: Pelvic rest, as tolerated Diet: routine Medications: Tylenol, motrin, oxycodone, procardia Condition: stable Instructions: Refer to practice specific booklet.  Discussed prior to discharge.  Discharge to: Home  Follow-up Information     [place] Follow up.   Why: Please follow up for a 1 week blood pressure check. Contact information: [address] [place] [address] [phone]                 Newborn Data: Live born female  Birth Weight: 6 lb 15.5 oz (3160 g) APGAR: 8, 9  Newborn Delivery   Birth date/time: 11/04/2022 17:50:00 Delivery type: C-Section, Low Transverse Trial of labor: Yes C-section categorization: Primary      Home with mother.  Lyn Henri 11/06/2022, 2:25 PM

## 2022-11-06 NOTE — Progress Notes (Signed)
Postpartum Progress Note  Postpartum Day 2 s/p primary Cesarean section.  Subjective:  Patient reports no overnight events.  She reports well controlled pain, ambulating without difficulty, voiding spontaneously, tolerating PO.  She reports Positive flatus, Positive BM.  Vaginal bleeding is minimal.  Objective: Blood pressure 126/70, pulse 76, temperature 97.8 F (36.6 C), temperature source Oral, resp. rate 18, height 5\' 3"  (1.6 m), weight 106 kg, last menstrual period 02/17/2022, SpO2 100 %, unknown if currently breastfeeding.  Physical Exam:  General: alert and no distress Lochia: appropriate Abdomen: soft, ATTP Uterine Fundus: firm Incision: dressing in place DVT Evaluation: No evidence of DVT seen on physical exam.  Recent Labs    11/04/22 2018 11/05/22 0436  HGB 9.5* 7.9*  HCT 28.7* 23.6*    Assessment/Plan: Postpartum Day 2, s/p C-section Acute blood loss anemia - s/p IV venofer. No signs or symptoms of anemia.  HTN in pregnancy - likely cHTN BP normal range postpartum Procardia XL 30 mg QD Lactation following Doing well, continue routine postpartum care. Anticipate discharge later today, she strongly desires discharge as soon as able. Would plan for 1 week PP BP check.   LOS: 3 days   Lyn Henri 11/06/2022, 7:46 AM

## 2022-11-07 ENCOUNTER — Inpatient Hospital Stay (HOSPITAL_COMMUNITY): Payer: 59

## 2022-11-14 ENCOUNTER — Telehealth (HOSPITAL_COMMUNITY): Payer: Self-pay

## 2022-11-14 NOTE — Telephone Encounter (Signed)
Patient reports feeling good. Patient states that her incision is healing well and that she does have some soreness at her incision site. RN told patient to take pain medications as prescribed and to contact her OB-GYN with any increased pain. Patient declines any other questions/concerns about her health and healing.  Patient reports that baby is doing well. Eating, peeing/pooping, and gaining weight well. Baby sleeps in a bassinet. RN reviewed ABC's of safe sleep with patient. Patient declines any questions or concerns about baby.  EPDS score is 1.  Suann [facility]  Perinatal Services   11/14/22,1356

## 2023-04-11 LAB — OB RESULTS CONSOLE HIV ANTIBODY (ROUTINE TESTING): HIV: NONREACTIVE

## 2023-04-11 LAB — OB RESULTS CONSOLE ANTIBODY SCREEN: Antibody Screen: NEGATIVE

## 2023-04-11 LAB — OB RESULTS CONSOLE RUBELLA ANTIBODY, IGM: Rubella: NON-IMMUNE/NOT IMMUNE

## 2023-04-11 LAB — OB RESULTS CONSOLE RPR: RPR: NONREACTIVE

## 2023-04-11 LAB — HEPATITIS C ANTIBODY: HCV Ab: NEGATIVE

## 2023-04-11 LAB — OB RESULTS CONSOLE HEPATITIS B SURFACE ANTIGEN: Hepatitis B Surface Ag: NEGATIVE

## 2023-04-19 LAB — OB RESULTS CONSOLE GC/CHLAMYDIA
Chlamydia: NEGATIVE
Neisseria Gonorrhea: NEGATIVE

## 2023-07-10 ENCOUNTER — Other Ambulatory Visit: Payer: Self-pay | Admitting: Obstetrics & Gynecology

## 2023-07-10 ENCOUNTER — Telehealth: Payer: Self-pay

## 2023-07-10 DIAGNOSIS — Z362 Encounter for other antenatal screening follow-up: Secondary | ICD-10-CM

## 2023-07-18 ENCOUNTER — Encounter: Payer: Self-pay | Admitting: *Deleted

## 2023-07-18 DIAGNOSIS — O09299 Supervision of pregnancy with other poor reproductive or obstetric history, unspecified trimester: Secondary | ICD-10-CM | POA: Insufficient documentation

## 2023-07-18 DIAGNOSIS — O30049 Twin pregnancy, dichorionic/diamniotic, unspecified trimester: Secondary | ICD-10-CM | POA: Insufficient documentation

## 2023-07-18 DIAGNOSIS — O99210 Obesity complicating pregnancy, unspecified trimester: Secondary | ICD-10-CM | POA: Insufficient documentation

## 2023-07-18 DIAGNOSIS — O34219 Maternal care for unspecified type scar from previous cesarean delivery: Secondary | ICD-10-CM | POA: Insufficient documentation

## 2023-07-26 ENCOUNTER — Encounter: Payer: Self-pay | Admitting: *Deleted

## 2023-07-26 ENCOUNTER — Ambulatory Visit (HOSPITAL_BASED_OUTPATIENT_CLINIC_OR_DEPARTMENT_OTHER): Payer: 59 | Admitting: Obstetrics and Gynecology

## 2023-07-26 ENCOUNTER — Other Ambulatory Visit: Payer: Self-pay

## 2023-07-26 ENCOUNTER — Ambulatory Visit: Payer: 59 | Attending: Obstetrics & Gynecology

## 2023-07-26 ENCOUNTER — Ambulatory Visit: Payer: 59

## 2023-07-26 VITALS — BP 127/70 | HR 108

## 2023-07-26 DIAGNOSIS — E6689 Other obesity not elsewhere classified: Secondary | ICD-10-CM | POA: Insufficient documentation

## 2023-07-26 DIAGNOSIS — O34219 Maternal care for unspecified type scar from previous cesarean delivery: Secondary | ICD-10-CM | POA: Insufficient documentation

## 2023-07-26 DIAGNOSIS — O99212 Obesity complicating pregnancy, second trimester: Secondary | ICD-10-CM | POA: Insufficient documentation

## 2023-07-26 DIAGNOSIS — Z362 Encounter for other antenatal screening follow-up: Secondary | ICD-10-CM | POA: Diagnosis present

## 2023-07-26 DIAGNOSIS — O09299 Supervision of pregnancy with other poor reproductive or obstetric history, unspecified trimester: Secondary | ICD-10-CM

## 2023-07-26 DIAGNOSIS — O99210 Obesity complicating pregnancy, unspecified trimester: Secondary | ICD-10-CM

## 2023-07-26 DIAGNOSIS — O10012 Pre-existing essential hypertension complicating pregnancy, second trimester: Secondary | ICD-10-CM | POA: Insufficient documentation

## 2023-07-26 DIAGNOSIS — Z3A27 27 weeks gestation of pregnancy: Secondary | ICD-10-CM | POA: Diagnosis present

## 2023-07-26 DIAGNOSIS — O30042 Twin pregnancy, dichorionic/diamniotic, second trimester: Secondary | ICD-10-CM

## 2023-07-26 DIAGNOSIS — Z8632 Personal history of gestational diabetes: Secondary | ICD-10-CM | POA: Insufficient documentation

## 2023-07-26 DIAGNOSIS — E669 Obesity, unspecified: Secondary | ICD-10-CM | POA: Diagnosis not present

## 2023-07-26 DIAGNOSIS — O09292 Supervision of pregnancy with other poor reproductive or obstetric history, second trimester: Secondary | ICD-10-CM

## 2023-07-26 NOTE — Progress Notes (Signed)
  Maternal-Fetal Medicine  I had the pleasure of seeing Ms. Abigail [facility] today at [facility]. She is G2 p1001 at 27w 4d gestation and is here for fetal anatomy scan. She has dichorionic-diamniotic twin pregnancy and fetal anatomical survey could not be completed at your office.  Obstetrical history is significant for a term cesarean delivery (failure to progress in labor) in May 2024 of a female infant weighing 6 pounds and 15 ounces at birth.  Her pregnancy was complicated by chronic hypertension (on nifedipine) and patient had induction of labor at [redacted] weeks gestation. She has a diagnosis of chronic hypertension but is not taking antihypertensives.  Blood pressure today at our office is 127/70 mmHg. On cell-free fetal DNA screening, the risks of fetal aneuploidies are not increased.  Patient reports she does not have gestational diabetes.  Ultrasound Patient had repeated episodes of nausea and vomiting and could not stay on the table. She requested to stop performing ultrasound. Detailed anatomical survey could not be performed.  Dichorionic-diamniotic twin pregnancy. Twin A: Maternal left, breech presentation, anterior placenta, female fetus.  Amniotic fluid is normal good fetal activity seen.  Fetal biometry is consistent with the previously established dates.  No obvious fetal structural defects are seen.  Twin B: Maternal right, cephalic presentation, anterior placenta, female fetus.  Amniotic fluid is normal good fetal activity seen.  Fetal biometry is consistent with the previously established dates.  No obvious fetal structural defects are seen. Growth discordancy: 1% (normal).  Our concerns include: Twin Pregnancy I explained the significance of chorionicity and its implications. Possible complications associated with twin pregnancy that include preterm labor/delivery (most common), fetal growth restriction of one or both twins, malpresentations and increased cesarean  delivery rate, postpartum hemorrhage. I also informed her that maternal complications including gestational diabetes and gestational hypertension/preeclampsia are more common. I discussed the mode of delivery that is based on the presentations.  Patient is firm in her decision to undergo elective cesarean delivery.  Prophylactic low-dose aspirin delays or prevents preeclampsia. Twin pregnancy and chronic hypertension are high risk factors for preeclampsia. Patient takes low-dose aspirin prophylaxis.  Recommendations -Fetal growth assessments every 4 weeks.  Targeted fetal anatomical survey may not be possible because of patient intolerance to prolonged ultrasound. -BPP at 9- and 37-weeks' gestation. -If patient requires antihypertensive medications, I recommend weekly BPP from [redacted] weeks gestation. -Delivery at [redacted] weeks gestation.  Thank you for consultation.  If you have any questions or concerns, please contact me the [facility].  Consultation including face-to-face (more than 50%) counseling 30 minutes.

## 2023-08-20 ENCOUNTER — Encounter (HOSPITAL_COMMUNITY): Payer: Self-pay | Admitting: Obstetrics and Gynecology

## 2023-08-20 ENCOUNTER — Inpatient Hospital Stay (HOSPITAL_COMMUNITY)
Admission: AD | Admit: 2023-08-20 | Discharge: 2023-08-20 | Disposition: A | Discharge status: Home / Self Care | Payer: 59 | Attending: Obstetrics and Gynecology | Admitting: Obstetrics and Gynecology

## 2023-08-20 DIAGNOSIS — O368130 Decreased fetal movements, third trimester, not applicable or unspecified: Secondary | ICD-10-CM | POA: Diagnosis present

## 2023-08-20 DIAGNOSIS — O34219 Maternal care for unspecified type scar from previous cesarean delivery: Secondary | ICD-10-CM | POA: Insufficient documentation

## 2023-08-20 DIAGNOSIS — Z3A31 31 weeks gestation of pregnancy: Secondary | ICD-10-CM | POA: Diagnosis not present

## 2023-08-20 DIAGNOSIS — O30043 Twin pregnancy, dichorionic/diamniotic, third trimester: Secondary | ICD-10-CM | POA: Diagnosis not present

## 2023-08-20 DIAGNOSIS — D649 Anemia, unspecified: Secondary | ICD-10-CM | POA: Diagnosis present

## 2023-08-20 HISTORY — DX: Anemia, unspecified: D64.9

## 2023-08-20 MED ORDER — ONDANSETRON 4 MG PO TBDP: 4.0000 mg | ORAL_TABLET | Freq: Four times a day (QID) | ORAL | 5 refills | Status: AC | PRN

## 2023-08-20 NOTE — MAU Note (Signed)
 Abigail Gray is a 33 y.o. at [redacted]w[redacted]d here in MAU reporting: DFM all day today. Pt states she last felt fetal movement at 2200 last night. Pt denies LOF or VB. Pt states she hasn't been feeling good for about 2 weeks but attributes it to being anemic.   Onset of complaint: 2200 Pain score: 6/10 whole body aches mostly in hips  Vitals:   08/20/23 1934  BP: 136/76  Pulse: 97  Resp: 20  Temp: 98.2 F (36.8 C)  SpO2: 97%     EXB:MWUX A: 144 Baby B: 150  Lab orders placed from triage:

## 2023-08-20 NOTE — MAU Provider Note (Signed)
 History     [id number]  Arrival date and time: 08/20/23 1857    Chief Complaint  Patient presents with   Decreased Fetal Movement     HPI Abigail Gray is a 33 y.o. at 110w1d with PMHx notable for di/di twins, one prior cesarean, who presents for decreased fetal movement.   Review of outside prenatal records from [facility] (in media tab): unremarkable prenatal course to date  Patient reports decreased fetal movement that has resolved while in MAU No leaking fluid or vaginal bleeding Some intermittent contractions but nothing sustained   --/--/O POS (05/10 0039)  OB History     Gravida  2   Para  1   Term  1   Preterm  0   AB  0   Living  1      SAB  0   IAB  0   Ectopic  0   Multiple  0   Live Births  1           Past Medical History:  Diagnosis Date   Anemia    Depression    Headache    Hemorrhoids    Kidney stones    UTI (lower urinary tract infection)     Past Surgical History:  Procedure Laterality Date   CESAREAN SECTION N/A 11/04/2022   Procedure: CESAREAN SECTION;  Surgeon: Harold Hedge, MD;  Location: [facility] LD ORS;  Service: Obstetrics;  Laterality: N/A;   NO PAST SURGERIES      Family History  Problem Relation Age of Onset   Cancer Father        lung   Cancer Mother        ovarian and breast     Social History   Socioeconomic History   Marital status: Significant Other    Spouse name: Fuller [place]   Number of children: Not on file   Years of education: Not on file   Highest education level: Not on file  Occupational History   Not on file  Tobacco Use   Smoking status: Former    Current packs/day: 0.50    Types: Cigarettes   Smokeless tobacco: Never  Vaping Use   Vaping status: Never Used  Substance and Sexual Activity   Alcohol use: No   Drug use: Not Currently    Types: Marijuana    Comment: occasionally   Sexual activity: Yes  Other Topics Concern   Not on file  Social History Narrative    Not on file   Social Drivers of Health   Financial Resource Strain: Low Risk  (07/27/2021)   Received from [facility], [facility]   Overall Financial Resource Strain (CARDIA)    Difficulty of Paying Living Expenses: Not very hard  Food Insecurity: No Food Insecurity (11/03/2022)   Hunger Vital Sign    Worried About Running Out of Food in the Last Year: Never true    Ran Out of Food in the Last Year: Never true  Transportation Needs: No Transportation Needs (11/03/2022)   PRAPARE - Administrator, Civil Service (Medical): No    Lack of Transportation (Non-Medical): No  Physical Activity: Sufficiently Active (07/27/2021)   Received from [facility], [facility]   Exercise Vital Sign    Days of Exercise per Week: 5 days    Minutes of Exercise per Session: 30 min  Stress: Stress Concern Present (07/27/2021)   Received from [facility], [facility]  Occupational Health - Occupational Stress Questionnaire    Feeling of Stress : Very much  Social Connections: Unknown (10/28/2021)   Received from [facility], [facility]   Social Network    Social Network: Not on file  Intimate Partner Violence: Not At Risk (11/03/2022)   Humiliation, Afraid, Rape, and Kick questionnaire    Fear of Current or Ex-Partner: No    Emotionally Abused: No    Physically Abused: No    Sexually Abused: No    Allergies  Allergen Reactions   Doxycycline Other (See Comments)    Caused yeast infection    Povidone-Iodine Other (See Comments)    Burning and throbbing   Shellfish Allergy Hives and Diarrhea    No current facility-administered medications on file prior to encounter.   Current Outpatient Medications on File Prior to Encounter  Medication Sig Dispense Refill   ferrous sulfate 325 (65 FE) MG tablet Take 325 mg by mouth daily with breakfast.     Prenatal Vit-Fe Fumarate-FA (PRENATAL MULTIVITAMIN) TABS tablet Take 1 tablet by mouth daily at 12 noon.      acetaminophen (TYLENOL) 325 MG tablet Take 2 tablets (650 mg total) by mouth every 6 (six) hours as needed. 30 tablet 0   ibuprofen (ADVIL) 600 MG tablet Take 1 tablet (600 mg total) by mouth every 6 (six) hours as needed for moderate pain. (Patient not taking: Reported on 07/26/2023) 30 tablet 0   NIFEdipine (PROCARDIA-XL/NIFEDICAL-XL) 30 MG 24 hr tablet Take 1 tablet (30 mg total) by mouth daily. 30 tablet 2     ROS Pertinent positives and negative per HPI, all others reviewed and negative  Physical Exam   BP 136/76 (BP Location: Right Arm)   Pulse 97   Temp 98.2 F (36.8 C) (Oral)   Resp 20   Ht 5\' 3"  (1.6 m)   Wt 98.5 kg   LMP 01/14/2023   SpO2 97%   BMI 38.46 kg/m   Patient Vitals for the past 24 hrs:  BP Temp Temp src Pulse Resp SpO2 Height Weight  08/20/23 1934 136/76 98.2 F (36.8 C) Oral 97 20 97 % -- --  08/20/23 1916 -- -- -- -- -- -- 5\' 3"  (1.6 m) 98.5 kg    Physical Exam Vitals reviewed.  Constitutional:      General: She is not in acute distress.    Appearance: She is well-developed. She is not diaphoretic.  Eyes:     General: No scleral icterus. Pulmonary:     Effort: Pulmonary effort is normal. No respiratory distress.  Skin:    General: Skin is warm and dry.  Neurological:     Mental Status: She is alert.     Coordination: Coordination normal.      Cervical Exam    Bedside Ultrasound Not performed.  My interpretation: n/a  FHT A Baseline: 145 bpm Variability: Good {> 6 bpm) Accelerations: Reactive Decelerations: Absent Uterine activity: poor tracing but no obvious contractions Cat: I  FHT B Baseline: 150 bpm Variability: Good {> 6 bpm) Accelerations: Reactive Decelerations: Absent Uterine activity: poor tracing but no obvious contractions Cat: I  Labs No results found for this or any previous visit (from the past 24 hours).  Imaging No results found.  MAU Course  Procedures Lab Orders  No laboratory test(s) ordered today    Meds ordered this encounter  Medications   ondansetron (ZOFRAN-ODT) 4 MG disintegrating tablet    Sig: Take 1 tablet (4 mg total) by mouth every  6 (six) hours as needed for nausea.    Dispense:  20 tablet    Refill:  5   Imaging Orders  No imaging studies ordered today    MDM Moderate (Level 3-4)  Assessment and Plan  #Decreased fetal movement #[redacted] weeks gestation of pregnancy Resolved while in MAU, NST reactive for both babies  #FWB FHT Cat I x2 NST: Reactive x2   Dispo: discharged to home in stable condition    Venora Maples, MD/MPH 08/20/23 8:31 PM  Allergies as of 08/20/2023       Reactions   Doxycycline Other (See Comments)   Caused yeast infection    Povidone-iodine Other (See Comments)   Burning and throbbing   Shellfish Allergy Hives, Diarrhea        Medication List     STOP taking these medications    ibuprofen 600 MG tablet Commonly known as: ADVIL       TAKE these medications    acetaminophen 325 MG tablet Commonly known as: Tylenol Take 2 tablets (650 mg total) by mouth every 6 (six) hours as needed.   ferrous sulfate 325 (65 FE) MG tablet Take 325 mg by mouth daily with breakfast.   NIFEdipine 30 MG 24 hr tablet Commonly known as: PROCARDIA-XL/NIFEDICAL-XL Take 1 tablet (30 mg total) by mouth daily.   ondansetron 4 MG disintegrating tablet Commonly known as: ZOFRAN-ODT Take 1 tablet (4 mg total) by mouth every 6 (six) hours as needed for nausea.   prenatal multivitamin Tabs tablet Take 1 tablet by mouth daily at 12 noon.

## 2023-08-22 ENCOUNTER — Encounter (HOSPITAL_COMMUNITY): Payer: Self-pay | Admitting: Obstetrics and Gynecology

## 2023-08-22 ENCOUNTER — Other Ambulatory Visit: Payer: Self-pay

## 2023-08-22 ENCOUNTER — Observation Stay (HOSPITAL_COMMUNITY)
Admission: AD | Admit: 2023-08-22 | Discharge: 2023-08-22 | Disposition: A | Discharge status: Home / Self Care | Payer: 59 | Attending: Obstetrics and Gynecology | Admitting: Obstetrics and Gynecology

## 2023-08-22 MED ORDER — PRENATAL MULTIVITAMIN CH
1.0000 | ORAL_TABLET | Freq: Every day | ORAL | Status: DC
  Administered 2023-08-22: 1 via ORAL
  Filled 2023-08-22: qty 1

## 2023-08-22 MED ORDER — IRON SUCROSE 500 MG IVPB - SIMPLE MED
500.0000 mg | Freq: Once | INTRAVENOUS | Status: DC
  Filled 2023-08-22: qty 275

## 2023-08-22 MED ORDER — CALCIUM CARBONATE ANTACID 500 MG PO CHEW: 2.0000 | CHEWABLE_TABLET | ORAL | Status: DC | PRN

## 2023-08-22 MED ORDER — CYCLOBENZAPRINE HCL 10 MG PO TABS: 10.0000 mg | ORAL_TABLET | Freq: Three times a day (TID) | ORAL | 0 refills | Status: DC | PRN

## 2023-08-22 MED ORDER — DOCUSATE SODIUM 100 MG PO CAPS
100.0000 mg | ORAL_CAPSULE | Freq: Every day | ORAL | Status: DC
  Administered 2023-08-22: 100 mg via ORAL
  Filled 2023-08-22: qty 1

## 2023-08-22 MED ORDER — ACETAMINOPHEN 325 MG PO TABS: 650.0000 mg | ORAL_TABLET | ORAL | Status: DC | PRN

## 2023-08-22 MED ORDER — BETAMETHASONE SOD PHOS & ACET 6 (3-3) MG/ML IJ SUSP
12.0000 mg | INTRAMUSCULAR | Status: DC
  Administered 2023-08-22: 12 mg via INTRAMUSCULAR
  Filled 2023-08-22: qty 5

## 2023-08-22 MED ORDER — SODIUM CHLORIDE 0.9 % IV SOLN
500.0000 mg | Freq: Once | INTRAVENOUS | Status: AC
  Administered 2023-08-22: 500 mg via INTRAVENOUS
  Filled 2023-08-22: qty 25

## 2023-08-22 NOTE — Discharge Summary (Signed)
 Physician Discharge Summary  Patient ID: Abigail Gray MRN: [id number] DOB/AGE: 01/03/1991 32 y.o.  Admit date: 08/22/2023 Discharge date: 08/22/2023  Admission Diagnoses:Twins (Di/Di) at 31 3/7 weeks, Positive FFN for evaluation of preterm labor, Anemia (iron deficient)  Discharge Diagnoses: Same Principal Problem:   Preterm labor   Discharged Condition: good  Hospital Course: Positive FFN from office yesterday with twins, sent to antenatal for fetal monitoring for possible PTL.  No cervical change yesterday and EFM showed reassuring FHR of both twins and no ctxs.  She had IV Fe due to anemia and her BPs remained high normal without sxs of PIH.  She also received first BMZ shot today. D/c home with PTL warnings and fu tomorrow for second BMZ and BP eval  Consults: None  Significant Diagnostic Studies: labs: postiive FFN, Hgb 9.9 and ferritin 10  Treatments: IV FE, fetal monitoring, BMZ  Discharge Exam: Blood pressure 132/77, pulse (!) 110, temperature 97.8 F (36.6 C), temperature source Oral, resp. rate 19, height 5\' 3"  (1.6 m), weight 97.7 kg, last menstrual period 01/14/2023, SpO2 98%, unknown if currently breastfeeding. General appearance: alert, cooperative, appears stated age, and no distress Abd Gravid, nt  Disposition: Discharge disposition: 01-Home or Self Care       Discharge Instructions     Notify physician for a general feeling that "something is not right"   Complete by: As directed    Notify physician for increase or change in vaginal discharge   Complete by: As directed    Notify physician for intestinal cramps, with or without diarrhea, sometimes described as "gas pain"   Complete by: As directed    Notify physician for leaking of fluid   Complete by: As directed    Notify physician for low, dull backache, unrelieved by heat or Tylenol   Complete by: As directed    Notify physician for menstrual like cramps   Complete by: As directed    Notify  physician for pelvic pressure   Complete by: As directed    Notify physician for uterine contractions.  These may be painless and feel like the uterus is tightening or the baby is  "balling up"   Complete by: As directed    Notify physician for vaginal bleeding   Complete by: As directed    PRETERM LABOR:  Includes any of the follwing symptoms that occur between 20 - [redacted] weeks gestation.  If these symptoms are not stopped, preterm labor can result in preterm delivery, placing your baby at risk   Complete by: As directed       Allergies as of 08/22/2023       Reactions   Doxycycline Other (See Comments)   Caused yeast infection    Povidone-iodine Other (See Comments)   Burning and throbbing   Shellfish Allergy Hives, Diarrhea        Medication List     STOP taking these medications    NIFEdipine 30 MG 24 hr tablet Commonly known as: PROCARDIA-XL/NIFEDICAL-XL       TAKE these medications    acetaminophen 325 MG tablet Commonly known as: Tylenol Take 2 tablets (650 mg total) by mouth every 6 (six) hours as needed.   cyclobenzaprine 10 MG tablet Commonly known as: FLEXERIL Take 1 tablet (10 mg total) by mouth 3 (three) times daily as needed for muscle spasms.   ferrous sulfate 325 (65 FE) MG tablet Take 325 mg by mouth daily with breakfast.   ondansetron 4 MG disintegrating tablet Commonly  known as: ZOFRAN-ODT Take 1 tablet (4 mg total) by mouth every 6 (six) hours as needed for nausea.   prenatal multivitamin Tabs tablet Take 1 tablet by mouth daily at 12 noon.         Signed: Turner Daniels 08/22/2023, 5:29 PM

## 2023-08-22 NOTE — MAU Note (Signed)
.  Abigail Gray is a 33 y.o. at [redacted]w[redacted]d here in MAU reporting: Here because she was told to come in because her FFN that was drawn yesterday came back positive. She denies leaking fluids. She reports she has been "increasingly wet" for "weeks now." Denies gushes of fluids and any trickles. Ongoing back pain and lower abdominal pain throughout her pregnancy. Has a referral to PT. Denies VB. +FM.  Di/Di Twins. CHTN. Hx C/S x1 d/t failed IOL for GHTN and GDM with FTP. Received an iron infusion PP. Desires repeat C/S.  Prenatal visit yesterday and patient had a severe range BP of 186/120 with follow up BP of 146/88. Pelvic performed yesterday and cervix visually closed. FFN sent. Very anxious yesterday and today. Starting Vistaril. RTO tomorrow or next day for repeat BP check.  Onset of complaint: Today Pain score: 6/10 lower back  Vitals:   08/22/23 1013  BP: (!) 141/89  Pulse: (!) 107  Resp: 16  Temp: 97.7 F (36.5 C)  SpO2: 100%     FHT's listened to simultaneously in triage: Twin A: 150 external Twin B: 145 doppler  Lab orders placed from triage: none

## 2023-08-22 NOTE — MAU Note (Signed)
 This RN received phone call that patient was supposed to be a direct admit to [facility]. Patient's RN informed this RN that the patient was unaware of admission and desired to go home today. This RN called Dr. Rana Snare who stated the patient should have been informed per the office RN. MD stated he was with another patient and requested this RN inform patient of plan to admit to [facility] for at least a 24 hour observation to receive BMZ as well.   Patient informed. Patient aware MD to come see patient upon arrival to [facility].

## 2023-08-22 NOTE — H&P (Signed)
 Abigail Gray is a 33 y.o. female presenting for fetal monitoring due to positive FFN yesterday in the office.  Pregnancy is complicated by twin pregnancy at 31 weeks and presented for back pain and also had elevated BPs.  FFN positive so sent to hospital for monitoring, possible tocolysis and BMZ.  Labs for [facility] were normal except anemia Hgb 9.9 and ferritin 10. Hx of Fe intolerance requiring Fe transfusion last pregnancy No PIH sxs, and GFM and no fetal ctxs noted.  Cx was closed yesterday afternoon. OB History     Gravida  2   Para  1   Term  1   Preterm  0   AB  0   Living  1      SAB  0   IAB  0   Ectopic  0   Multiple  0   Live Births  1          Past Medical History:  Diagnosis Date   Anemia    Depression    Headache    Hemorrhoids    Kidney stones    UTI (lower urinary tract infection)    Past Surgical History:  Procedure Laterality Date   CESAREAN SECTION N/A 11/04/2022   Procedure: CESAREAN SECTION;  Surgeon: Harold Hedge, MD;  Location: [facility] LD ORS;  Service: Obstetrics;  Laterality: N/A;   NO PAST SURGERIES     Family History: family history includes Cancer in her father and mother. Social History:  reports that she has quit smoking. Her smoking use included cigarettes. She has never used smokeless tobacco. She reports that she does not currently use drugs after having used the following drugs: Marijuana. She reports that she does not drink alcohol.     Maternal Diabetes: No Genetic Screening: Normal Maternal Ultrasounds/Referrals: Normal Fetal Ultrasounds or other Referrals:  Referred to Materal Fetal Medicine  Maternal Substance Abuse:  No Significant Maternal Medications:  None Significant Maternal Lab Results:  None Number of Prenatal Visits:greater than 3 verified prenatal visits Maternal Vaccinations:TDap Other Comments:  None  Review of Systems History   Blood pressure 132/77, pulse (!) 110, temperature 97.8 F (36.6 C),  temperature source Oral, resp. rate 19, height 5\' 3"  (1.6 m), weight 97.7 kg, last menstrual period 01/14/2023, SpO2 98%, unknown if currently breastfeeding. Exam Physical Exam  Vitals and nursing note reviewed. Exam conducted with a chaperone present.  Constitutional:      Appearance: Normal appearance.  HENT:     Head: Normocephalic.  Eyes:     Pupils: Pupils are equal, round, and reactive to light.  Cardiovascular:     Rate and Rhythm: Normal rate and regular rhythm.     Pulses: Normal pulses.  Abdominal:     General: Abdomen is Gravid, nontender Neurological:     Mental Status: She is alert. Cx Cl /th/ high yesterday with Dr Imogene Burn Prenatal labs: ABO, Rh: --/--/O POS (05/10 0039) Antibody: NEG (05/10 0039) Rubella:   RPR: NON REACTIVE (05/10 0039)  HBsAg:    HIV: Non Reactive (05/10 0039)  GBS: NEGATIVE/-- (05/10 0039)   Assessment/Plan: IUP twins Br/Vtx Di/Di at 31 3/7 with positive FFN Discussed the significance of this at length.  So far, no Ctx noted and FHR reassuring. Rec BMZ series and close monitoring for PTL sxs.  Wants to get first BMZ here and fu in office tomorrow for second Anemia/low ferritin - Pt requests were proceed with IV Fe while here due to her Fe intolerance, anemia,  twins and hx of this in the past No PIH sxs.  Will continue to monitor as outpt    Turner Daniels 08/22/2023, 3:30 PM

## 2023-09-20 NOTE — Patient Instructions (Signed)
 KERRA GUILFOIL  09/20/2023   Your procedure is scheduled on:  10/04/2023  Arrive at 0800 at Entrance C on [address] at [facility]  and [employer]. You are invited to use the FREE valet parking or use the Visitor's parking deck.  Pick up the phone at the desk and dial [phone].  Call this number if you have problems the morning of surgery: [phone]  Remember:   Do not eat food:(After Midnight) Desps de medianoche.  You may drink clear liquids until arrival at ___0800__.  Clear liquids means a liquid you can see thru.  It can have color such as Cola or Kool aid.  Tea is OK and coffee as long as no milk or creamer of any kind.  Take these medicines the morning of surgery with A SIP OF WATER:  none   Do not wear jewelry, make-up or nail polish.  Do not wear lotions, powders, or perfumes. Do not wear deodorant.  Do not shave 48 hours prior to surgery.  Do not bring valuables to the hospital.  [facility] is not   responsible for any belongings or valuables brought to the hospital.  Contacts, dentures or bridgework may not be worn into surgery.  Leave suitcase in the car. After surgery it may be brought to your room.  For patients admitted to the hospital, checkout time is 11:00 AM the day of              discharge.      Please read over the following fact sheets that you were given:     Preparing for Surgery

## 2023-09-21 ENCOUNTER — Encounter (HOSPITAL_COMMUNITY): Payer: Self-pay

## 2023-09-21 ENCOUNTER — Telehealth (HOSPITAL_COMMUNITY): Payer: Self-pay | Admitting: *Deleted

## 2023-09-21 NOTE — Telephone Encounter (Signed)
 Preadmission screen

## 2023-09-23 ENCOUNTER — Other Ambulatory Visit: Payer: Self-pay

## 2023-09-23 ENCOUNTER — Inpatient Hospital Stay (HOSPITAL_COMMUNITY)
Admission: AD | Admit: 2023-09-23 | Discharge: 2023-09-23 | Disposition: A | Discharge status: Home / Self Care | Attending: Obstetrics and Gynecology | Admitting: Obstetrics and Gynecology

## 2023-09-23 ENCOUNTER — Encounter (HOSPITAL_COMMUNITY): Payer: Self-pay | Admitting: Obstetrics and Gynecology

## 2023-09-23 DIAGNOSIS — Z3A36 36 weeks gestation of pregnancy: Secondary | ICD-10-CM | POA: Insufficient documentation

## 2023-09-23 DIAGNOSIS — Z0371 Encounter for suspected problem with amniotic cavity and membrane ruled out: Secondary | ICD-10-CM | POA: Diagnosis not present

## 2023-09-23 LAB — RUPTURE OF MEMBRANE (ROM)PLUS: Rom Plus: NEGATIVE

## 2023-09-23 LAB — POCT FERN TEST: POCT Fern Test: NEGATIVE

## 2023-09-23 NOTE — MAU Provider Note (Signed)
 S: Ms. Abigail Gray is a 33 y.o. G2P1001 at [redacted]w[redacted]d  who presents to MAU today complaining of leaking of fluid since 1200. She denies vaginal bleeding. She denies contractions. She reports normal fetal movement.    O: BP 132/81 (BP Location: Right Arm)   Pulse (!) 131   Temp 97.8 F (36.6 C) (Oral)   Resp 20   Ht 5\' 3"  (1.6 m)   Wt 100.7 kg   LMP 01/14/2023   SpO2 99%   BMI 39.33 kg/m  GENERAL: Well-developed, well-nourished female in no acute distress.  HEAD: Normocephalic, atraumatic.  CHEST: Normal effort of breathing, regular heart rate ABDOMEN: Soft, nontender, gravid  Fetal Tracing:  Baby A  Baseline: 145 Variability: moderate Accels: 15x15 Decels: none  Baby B  Baseline: 130 Variability: moderate Accels: 15x15 Decels: none  Toco:UI   Results for orders placed or performed during the hospital encounter of 09/23/23 (from the past 24 hours)  Rupture of Membrane (ROM) Plus     Status: None   Collection Time: 09/23/23  3:56 PM  Result Value Ref Range   Rom Plus NEGATIVE   POCT fern test     Status: None   Collection Time: 09/23/23  4:07 PM  Result Value Ref Range   POCT Fern Test Negative = intact amniotic membranes      A: SIUP at [redacted]w[redacted]d  Membranes intact  P: -Discharge home in stable condition -Third trimester precautions discussed -Patient advised to follow-up with OB as scheduled for prenatal care -Patient may return to MAU as needed or if her condition were to change or worsen   Rolm Bookbinder, [place] 09/23/2023 3:56 PM

## 2023-09-23 NOTE — Discharge Instructions (Signed)

## 2023-09-23 NOTE — MAU Note (Signed)
 Abigail Gray is a 33 y.o. at [redacted]w[redacted]d here in MAU reporting: a gush of white discharge. Patient denies having vaginal bleeding. Patient does feel FM. Patient reports pressure in her pelvis and lower back that is causing her pain. Patient reports having a headache, but no visual changes.  LMP: 01/14/2023 Onset of complaint: 1430 Pain score: 5 Vitals:   09/23/23 1540 09/23/23 1541  BP:  (!) 141/98  Pulse:  (!) 128  Resp:  20  Temp:    SpO2: 99%      FHT: Baby A-145          Baby B-145

## 2023-09-24 ENCOUNTER — Encounter (HOSPITAL_COMMUNITY): Payer: Self-pay

## 2023-10-01 ENCOUNTER — Encounter (HOSPITAL_COMMUNITY)
Admission: AD | Disposition: A | Discharge status: Home / Self Care | Payer: Self-pay | Source: Home / Self Care | Attending: Obstetrics and Gynecology

## 2023-10-01 ENCOUNTER — Inpatient Hospital Stay (HOSPITAL_COMMUNITY)

## 2023-10-01 ENCOUNTER — Inpatient Hospital Stay (HOSPITAL_COMMUNITY): Admission: AD | Admit: 2023-10-01 | Payer: 59 | Source: Home / Self Care | Admitting: Obstetrics and Gynecology

## 2023-10-01 ENCOUNTER — Encounter (HOSPITAL_COMMUNITY): Payer: Self-pay | Admitting: Obstetrics and Gynecology

## 2023-10-01 ENCOUNTER — Inpatient Hospital Stay (HOSPITAL_COMMUNITY)
Admission: AD | Admit: 2023-10-01 | Discharge: 2023-10-03 | DRG: 784 | Disposition: A | Discharge status: Home / Self Care | Attending: Obstetrics and Gynecology | Admitting: Obstetrics and Gynecology

## 2023-10-01 DIAGNOSIS — O30043 Twin pregnancy, dichorionic/diamniotic, third trimester: Secondary | ICD-10-CM | POA: Diagnosis present

## 2023-10-01 DIAGNOSIS — O9081 Anemia of the puerperium: Secondary | ICD-10-CM | POA: Diagnosis not present

## 2023-10-01 DIAGNOSIS — O34219 Maternal care for unspecified type scar from previous cesarean delivery: Secondary | ICD-10-CM

## 2023-10-01 DIAGNOSIS — Z3A37 37 weeks gestation of pregnancy: Secondary | ICD-10-CM

## 2023-10-01 DIAGNOSIS — O365931 Maternal care for other known or suspected poor fetal growth, third trimester, fetus 1: Secondary | ICD-10-CM | POA: Diagnosis present

## 2023-10-01 DIAGNOSIS — D62 Acute posthemorrhagic anemia: Secondary | ICD-10-CM | POA: Diagnosis not present

## 2023-10-01 DIAGNOSIS — Z87891 Personal history of nicotine dependence: Secondary | ICD-10-CM

## 2023-10-01 DIAGNOSIS — Z98891 History of uterine scar from previous surgery: Secondary | ICD-10-CM

## 2023-10-01 DIAGNOSIS — Z302 Encounter for sterilization: Secondary | ICD-10-CM | POA: Diagnosis not present

## 2023-10-01 DIAGNOSIS — R030 Elevated blood-pressure reading, without diagnosis of hypertension: Secondary | ICD-10-CM | POA: Diagnosis present

## 2023-10-01 DIAGNOSIS — Z3490 Encounter for supervision of normal pregnancy, unspecified, unspecified trimester: Principal | ICD-10-CM

## 2023-10-01 DIAGNOSIS — O34211 Maternal care for low transverse scar from previous cesarean delivery: Secondary | ICD-10-CM | POA: Diagnosis present

## 2023-10-01 DIAGNOSIS — O1092 Unspecified pre-existing hypertension complicating childbirth: Principal | ICD-10-CM | POA: Diagnosis present

## 2023-10-01 LAB — COMPREHENSIVE METABOLIC PANEL WITH GFR
ALT: 9 U/L (ref 0–44)
AST: 18 U/L (ref 15–41)
Albumin: 2.8 g/dL — ABNORMAL LOW (ref 3.5–5.0)
Alkaline Phosphatase: 161 U/L — ABNORMAL HIGH (ref 38–126)
Anion gap: 12 (ref 5–15)
BUN: 8 mg/dL (ref 6–20)
CO2: 19 mmol/L — ABNORMAL LOW (ref 22–32)
Calcium: 9.9 mg/dL (ref 8.9–10.3)
Chloride: 105 mmol/L (ref 98–111)
Creatinine, Ser: 0.79 mg/dL (ref 0.44–1.00)
GFR, Estimated: >60 mL/min (ref >60)
Glucose, Bld: 94 mg/dL (ref 70–99)
Potassium: 3.9 mmol/L (ref 3.5–5.1)
Sodium: 136 mmol/L (ref 135–145)
Total Bilirubin: 0.3 mg/dL (ref 0.0–1.2)
Total Protein: 6.7 g/dL (ref 6.5–8.1)

## 2023-10-01 LAB — TYPE AND SCREEN
ABO/RH(D): O POS
Antibody Screen: NEGATIVE

## 2023-10-01 LAB — CBC
HCT: 34.8 % — ABNORMAL LOW (ref 36.0–46.0)
Hemoglobin: 11.4 g/dL — ABNORMAL LOW (ref 12.0–15.0)
MCH: 27.5 pg (ref 26.0–34.0)
MCHC: 32.8 g/dL (ref 30.0–36.0)
MCV: 83.9 fL (ref 80.0–100.0)
Platelets: 342 10*3/uL (ref 150–400)
RBC: 4.15 MIL/uL (ref 3.87–5.11)
RDW: 17.6 % — ABNORMAL HIGH (ref 11.5–15.5)
WBC: 15 10*3/uL — ABNORMAL HIGH (ref 4.0–10.5)
nRBC: 0 % (ref 0.0–0.2)

## 2023-10-01 LAB — HIV ANTIBODY (ROUTINE TESTING W REFLEX): HIV Screen 4th Generation wRfx: NONREACTIVE

## 2023-10-01 SURGERY — Surgical Case
Anesthesia: Spinal | Laterality: Bilateral

## 2023-10-01 MED ORDER — EPHEDRINE 5 MG/ML INJ
INTRAVENOUS | Status: AC
  Filled 2023-10-01: qty 5

## 2023-10-01 MED ORDER — WITCH HAZEL-GLYCERIN EX PADS: 1.0000 | MEDICATED_PAD | CUTANEOUS | Status: DC | PRN

## 2023-10-01 MED ORDER — FENTANYL CITRATE (PF) 100 MCG/2ML IJ SOLN
25.0000 ug | INTRAMUSCULAR | Status: DC | PRN
  Administered 2023-10-01 (×3): 50 ug via INTRAVENOUS

## 2023-10-01 MED ORDER — PHENYLEPHRINE 80 MCG/ML (10ML) SYRINGE FOR IV PUSH (FOR BLOOD PRESSURE SUPPORT)
PREFILLED_SYRINGE | INTRAVENOUS | Status: DC | PRN
  Administered 2023-10-01 (×2): 160 ug via INTRAVENOUS

## 2023-10-01 MED ORDER — LACTATED RINGERS IV SOLN: INTRAVENOUS | Status: DC | PRN

## 2023-10-01 MED ORDER — ONDANSETRON HCL 4 MG/2ML IJ SOLN
INTRAMUSCULAR | Status: AC
  Filled 2023-10-01: qty 2

## 2023-10-01 MED ORDER — KETOROLAC TROMETHAMINE 30 MG/ML IJ SOLN: 30.0000 mg | Freq: Four times a day (QID) | INTRAMUSCULAR | Status: DC | PRN

## 2023-10-01 MED ORDER — MORPHINE SULFATE (PF) 0.5 MG/ML IJ SOLN
INTRAMUSCULAR | Status: AC
  Filled 2023-10-01: qty 10

## 2023-10-01 MED ORDER — BUPIVACAINE IN DEXTROSE 0.75-8.25 % IT SOLN
INTRATHECAL | Status: DC | PRN
  Administered 2023-10-01: 1.6 mL via INTRATHECAL

## 2023-10-01 MED ORDER — DIPHENHYDRAMINE HCL 50 MG/ML IJ SOLN: 12.5000 mg | INTRAMUSCULAR | Status: DC | PRN

## 2023-10-01 MED ORDER — ACETAMINOPHEN 500 MG PO TABS
1000.0000 mg | ORAL_TABLET | Freq: Four times a day (QID) | ORAL | Status: DC
  Administered 2023-10-01 – 2023-10-03 (×7): 1000 mg via ORAL
  Filled 2023-10-01 (×7): qty 2

## 2023-10-01 MED ORDER — DIPHENHYDRAMINE HCL 25 MG PO CAPS: 25.0000 mg | ORAL_CAPSULE | Freq: Four times a day (QID) | ORAL | Status: DC | PRN

## 2023-10-01 MED ORDER — TRANEXAMIC ACID-NACL 1000-0.7 MG/100ML-% IV SOLN
INTRAVENOUS | Status: AC
Start: 2023-10-01 — End: ?
  Filled 2023-10-01: qty 100

## 2023-10-01 MED ORDER — KETOROLAC TROMETHAMINE 30 MG/ML IJ SOLN
30.0000 mg | Freq: Once | INTRAMUSCULAR | Status: AC
  Administered 2023-10-01: 30 mg via INTRAVENOUS

## 2023-10-01 MED ORDER — SOD CITRATE-CITRIC ACID 500-334 MG/5ML PO SOLN: 30.0000 mL | ORAL | Status: DC

## 2023-10-01 MED ORDER — EPHEDRINE SULFATE-NACL 50-0.9 MG/10ML-% IV SOSY
PREFILLED_SYRINGE | INTRAVENOUS | Status: DC | PRN
  Administered 2023-10-01: 10 mg via INTRAVENOUS
  Administered 2023-10-01 (×2): 5 mg via INTRAVENOUS

## 2023-10-01 MED ORDER — FENTANYL CITRATE (PF) 100 MCG/2ML IJ SOLN
INTRAMUSCULAR | Status: AC
  Filled 2023-10-01: qty 2

## 2023-10-01 MED ORDER — ACETAMINOPHEN 10 MG/ML IV SOLN
INTRAVENOUS | Status: DC | PRN
  Administered 2023-10-01: 1000 mg via INTRAVENOUS

## 2023-10-01 MED ORDER — ACETAMINOPHEN 500 MG PO TABS: 1000.0000 mg | ORAL_TABLET | Freq: Four times a day (QID) | ORAL | Status: DC

## 2023-10-01 MED ORDER — DEXAMETHASONE SODIUM PHOSPHATE 10 MG/ML IJ SOLN
INTRAMUSCULAR | Status: DC | PRN
  Administered 2023-10-01: 10 mg via INTRAVENOUS

## 2023-10-01 MED ORDER — SENNOSIDES-DOCUSATE SODIUM 8.6-50 MG PO TABS
2.0000 | ORAL_TABLET | Freq: Every day | ORAL | Status: DC
  Administered 2023-10-02 – 2023-10-03 (×2): 2 via ORAL
  Filled 2023-10-01 (×2): qty 2

## 2023-10-01 MED ORDER — DIPHENHYDRAMINE HCL 25 MG PO CAPS: 25.0000 mg | ORAL_CAPSULE | ORAL | Status: DC | PRN

## 2023-10-01 MED ORDER — PHENYLEPHRINE HCL-NACL 20-0.9 MG/250ML-% IV SOLN
INTRAVENOUS | Status: DC | PRN
  Administered 2023-10-01: 60 ug/min via INTRAVENOUS

## 2023-10-01 MED ORDER — SIMETHICONE 80 MG PO CHEW
80.0000 mg | CHEWABLE_TABLET | Freq: Three times a day (TID) | ORAL | Status: DC
  Administered 2023-10-02 – 2023-10-03 (×4): 80 mg via ORAL
  Filled 2023-10-01 (×4): qty 1

## 2023-10-01 MED ORDER — ONDANSETRON HCL 4 MG/2ML IJ SOLN
INTRAMUSCULAR | Status: DC | PRN
  Administered 2023-10-01: 4 mg via INTRAVENOUS

## 2023-10-01 MED ORDER — HYDROMORPHONE HCL 1 MG/ML IJ SOLN
INTRAMUSCULAR | Status: AC
  Filled 2023-10-01: qty 0.5

## 2023-10-01 MED ORDER — PHENYLEPHRINE 80 MCG/ML (10ML) SYRINGE FOR IV PUSH (FOR BLOOD PRESSURE SUPPORT)
PREFILLED_SYRINGE | INTRAVENOUS | Status: AC
  Filled 2023-10-01: qty 10

## 2023-10-01 MED ORDER — CEFAZOLIN SODIUM-DEXTROSE 2-4 GM/100ML-% IV SOLN
2.0000 g | INTRAVENOUS | Status: DC
  Filled 2023-10-01: qty 100

## 2023-10-01 MED ORDER — MENTHOL 3 MG MT LOZG: 1.0000 | LOZENGE | OROMUCOSAL | Status: DC | PRN

## 2023-10-01 MED ORDER — OXYTOCIN-SODIUM CHLORIDE 30-0.9 UT/500ML-% IV SOLN
2.5000 [IU]/h | INTRAVENOUS | Status: AC
  Administered 2023-10-01: 2.5 [IU]/h via INTRAVENOUS
  Filled 2023-10-01 (×2): qty 500

## 2023-10-01 MED ORDER — PRENATAL MULTIVITAMIN CH
1.0000 | ORAL_TABLET | Freq: Every day | ORAL | Status: DC
  Administered 2023-10-02 – 2023-10-03 (×2): 1 via ORAL
  Filled 2023-10-01 (×2): qty 1

## 2023-10-01 MED ORDER — OXYTOCIN-SODIUM CHLORIDE 30-0.9 UT/500ML-% IV SOLN
INTRAVENOUS | Status: AC
  Filled 2023-10-01: qty 500

## 2023-10-01 MED ORDER — STERILE WATER FOR IRRIGATION IR SOLN
Status: DC | PRN
  Administered 2023-10-01: 1000 mL

## 2023-10-01 MED ORDER — HYDROMORPHONE HCL 1 MG/ML IJ SOLN
0.5000 mg | INTRAMUSCULAR | Status: DC | PRN
  Administered 2023-10-01: 0.5 mg via INTRAVENOUS

## 2023-10-01 MED ORDER — MORPHINE SULFATE (PF) 0.5 MG/ML IJ SOLN
INTRAMUSCULAR | Status: DC | PRN
  Administered 2023-10-01: 150 ug via INTRATHECAL

## 2023-10-01 MED ORDER — ONDANSETRON HCL 4 MG/2ML IJ SOLN
4.0000 mg | Freq: Three times a day (TID) | INTRAMUSCULAR | Status: DC | PRN
  Administered 2023-10-02: 4 mg via INTRAVENOUS
  Filled 2023-10-01: qty 2

## 2023-10-01 MED ORDER — TRANEXAMIC ACID-NACL 1000-0.7 MG/100ML-% IV SOLN
INTRAVENOUS | Status: DC | PRN
  Administered 2023-10-01: 1000 mg via INTRAVENOUS

## 2023-10-01 MED ORDER — KETOROLAC TROMETHAMINE 30 MG/ML IJ SOLN
INTRAMUSCULAR | Status: AC
  Filled 2023-10-01: qty 1

## 2023-10-01 MED ORDER — KETOROLAC TROMETHAMINE 30 MG/ML IJ SOLN
30.0000 mg | Freq: Four times a day (QID) | INTRAMUSCULAR | Status: AC
  Administered 2023-10-01: 30 mg via INTRAVENOUS
  Filled 2023-10-01: qty 1

## 2023-10-01 MED ORDER — SOD CITRATE-CITRIC ACID 500-334 MG/5ML PO SOLN
30.0000 mL | ORAL | Status: AC
  Administered 2023-10-01: 30 mL via ORAL
  Filled 2023-10-01: qty 30

## 2023-10-01 MED ORDER — COCONUT OIL OIL: 1.0000 | TOPICAL_OIL | Status: DC | PRN

## 2023-10-01 MED ORDER — SODIUM CHLORIDE 0.9% FLUSH: 3.0000 mL | INTRAVENOUS | Status: DC | PRN

## 2023-10-01 MED ORDER — PHENYLEPHRINE HCL-NACL 20-0.9 MG/250ML-% IV SOLN
INTRAVENOUS | Status: AC
  Filled 2023-10-01: qty 250

## 2023-10-01 MED ORDER — CEFAZOLIN SODIUM-DEXTROSE 2-4 GM/100ML-% IV SOLN
2.0000 g | INTRAVENOUS | Status: AC
  Administered 2023-10-01: 2 g via INTRAVENOUS

## 2023-10-01 MED ORDER — DIBUCAINE (PERIANAL) 1 % EX OINT: 1.0000 | TOPICAL_OINTMENT | CUTANEOUS | Status: DC | PRN

## 2023-10-01 MED ORDER — OXYCODONE HCL 5 MG PO TABS
5.0000 mg | ORAL_TABLET | ORAL | Status: DC | PRN
  Administered 2023-10-02 – 2023-10-03 (×6): 10 mg via ORAL
  Filled 2023-10-01 (×6): qty 2

## 2023-10-01 MED ORDER — NALOXONE HCL 4 MG/10ML IJ SOLN: 1.0000 ug/kg/h | INTRAVENOUS | Status: DC | PRN

## 2023-10-01 MED ORDER — OXYTOCIN-SODIUM CHLORIDE 30-0.9 UT/500ML-% IV SOLN
INTRAVENOUS | Status: DC | PRN
Start: 2023-10-01 — End: 2023-10-01
  Administered 2023-10-01: 300 mL via INTRAVENOUS

## 2023-10-01 MED ORDER — IBUPROFEN 600 MG PO TABS
600.0000 mg | ORAL_TABLET | Freq: Four times a day (QID) | ORAL | Status: DC
  Administered 2023-10-02 – 2023-10-03 (×6): 600 mg via ORAL
  Filled 2023-10-01 (×6): qty 1

## 2023-10-01 MED ORDER — FENTANYL CITRATE (PF) 100 MCG/2ML IJ SOLN
INTRAMUSCULAR | Status: DC | PRN
  Administered 2023-10-01: 15 ug via INTRATHECAL

## 2023-10-01 MED ORDER — ACETAMINOPHEN 10 MG/ML IV SOLN
INTRAVENOUS | Status: AC
  Filled 2023-10-01: qty 100

## 2023-10-01 MED ORDER — LACTATED RINGERS IV SOLN: INTRAVENOUS | Status: DC

## 2023-10-01 MED ORDER — SIMETHICONE 80 MG PO CHEW: 80.0000 mg | CHEWABLE_TABLET | ORAL | Status: DC | PRN

## 2023-10-01 MED ORDER — NALOXONE HCL 0.4 MG/ML IJ SOLN: 0.4000 mg | INTRAMUSCULAR | Status: DC | PRN

## 2023-10-01 MED ORDER — DROPERIDOL 2.5 MG/ML IJ SOLN: 0.6250 mg | Freq: Once | INTRAMUSCULAR | Status: DC | PRN

## 2023-10-01 MED ORDER — ZOLPIDEM TARTRATE 5 MG PO TABS: 5.0000 mg | ORAL_TABLET | Freq: Every evening | ORAL | Status: DC | PRN

## 2023-10-01 MED ORDER — HYDROMORPHONE HCL 1 MG/ML IJ SOLN
0.2000 mg | INTRAMUSCULAR | Status: DC | PRN
  Administered 2023-10-01 – 2023-10-02 (×2): 0.6 mg via INTRAVENOUS
  Filled 2023-10-01 (×2): qty 1

## 2023-10-01 SURGICAL SUPPLY — 34 items
BENZOIN TINCTURE PRP APPL 2/3 (GAUZE/BANDAGES/DRESSINGS) IMPLANT
CHLORAPREP W/TINT 26 (MISCELLANEOUS) ×4 IMPLANT
CLAMP UMBILICAL CORD (MISCELLANEOUS) ×2 IMPLANT
CLOTH BEACON ORANGE TIMEOUT ST (SAFETY) ×2 IMPLANT
DERMABOND ADVANCED .7 DNX12 (GAUZE/BANDAGES/DRESSINGS) IMPLANT
DRSG OPSITE POSTOP 4X10 (GAUZE/BANDAGES/DRESSINGS) ×2 IMPLANT
ELECT REM PT RETURN 9FT ADLT (ELECTROSURGICAL) ×1 IMPLANT
ELECTRODE REM PT RTRN 9FT ADLT (ELECTROSURGICAL) ×2 IMPLANT
EXTRACTOR VACUUM M CUP 4 TUBE (SUCTIONS) IMPLANT
GAUZE PAD ABD 7.5X8 STRL (GAUZE/BANDAGES/DRESSINGS) IMPLANT
GAUZE SPONGE 4X4 12PLY STRL (GAUZE/BANDAGES/DRESSINGS) IMPLANT
GLOVE BIO SURGEON STRL SZ7.5 (GLOVE) ×2 IMPLANT
GLOVE BIOGEL PI IND STRL 7.0 (GLOVE) ×2 IMPLANT
GOWN SRG XL 47XLVL 4 REINF (GOWN DISPOSABLE) ×2 IMPLANT
GOWN STRL REUS W/TWL LRG LVL3 (GOWN DISPOSABLE) ×2 IMPLANT
KIT ABG SYR 3ML LUER SLIP (SYRINGE) ×2 IMPLANT
LIGASURE IMPACT 36 18CM CVD LR (INSTRUMENTS) IMPLANT
MAT PREVALON FULL STRYKER (MISCELLANEOUS) IMPLANT
NDL HYPO 25X5/8 SAFETYGLIDE (NEEDLE) ×2 IMPLANT
NEEDLE HYPO 22GX1.5 SAFETY (NEEDLE) ×2 IMPLANT
NEEDLE HYPO 25X5/8 SAFETYGLIDE (NEEDLE) ×1 IMPLANT
NS IRRIG 1000ML POUR BTL (IV SOLUTION) ×2 IMPLANT
PACK C SECTION WH (CUSTOM PROCEDURE TRAY) ×2 IMPLANT
PAD OB MATERNITY 4.3X12.25 (PERSONAL CARE ITEMS) ×2 IMPLANT
STRIP CLOSURE SKIN 1/2X4 (GAUZE/BANDAGES/DRESSINGS) IMPLANT
SUT MNCRL 0 VIOLET CTX 36 (SUTURE) ×8 IMPLANT
SUT PDS AB 0 CTX 60 (SUTURE) ×2 IMPLANT
SUT PLAIN 0 NONE (SUTURE) IMPLANT
SUT PLAIN 2 0 XLH (SUTURE) ×2 IMPLANT
SUT PLAIN ABS 2-0 CT1 27XMFL (SUTURE) IMPLANT
SUT VIC AB 4-0 KS 27 (SUTURE) ×2 IMPLANT
TOWEL OR 17X24 6PK STRL BLUE (TOWEL DISPOSABLE) ×2 IMPLANT
TRAY FOLEY W/BAG SLVR 14FR LF (SET/KITS/TRAYS/PACK) ×2 IMPLANT
WATER STERILE IRR 1000ML POUR (IV SOLUTION) ×2 IMPLANT

## 2023-10-01 NOTE — MAU Note (Signed)
.  Abigail Gray is a 34 y.o. at [redacted]w[redacted]d here in MAU reporting: she was sent from the office for a C/S today. Unclear if patient is direct admission or labor eval. Reports irregular UC that started yesterday. Denies VB or LOF. Reports +FM from both twins.   Last food intake last night at 2100. Last drank sweet tea at 1030 today.  Pain score: 9/10 Vitals:   10/01/23 1427  BP: (!) 133/96  Pulse: (!) 128  Resp: 16  Temp: 98.1 F (36.7 C)  SpO2: 99%     FHT: FHR A- 150; FHR B- 155  Lab orders placed from triage: MAU labor

## 2023-10-01 NOTE — Anesthesia Procedure Notes (Addendum)
 Spinal  Patient location during procedure: OR Start time: 10/01/2023 3:32 PM End time: 10/01/2023 3:35 PM Reason for block: surgical anesthesia Staffing Performed: anesthesiologist  Anesthesiologist: Kaylyn Layer, MD Performed by: Kaylyn Layer, MD Authorized by: Kaylyn Layer, MD   Preanesthetic Checklist Completed: patient identified, IV checked, risks and benefits discussed, monitors and equipment checked, pre-op evaluation and timeout performed Spinal Block Patient position: sitting Prep: DuraPrep and site prepped and draped Patient monitoring: heart rate, continuous pulse ox and blood pressure Approach: midline Location: L3-4 Injection technique: single-shot Needle Needle type: Pencan  Needle gauge: 24 G Needle length: 10 cm Assessment Sensory level: T4 Events: CSF return Additional Notes Risks, benefits, and alternative discussed. Patient gave consent to procedure. Prepped and draped in sitting position. Clear CSF obtained after one needle pass. Positive terminal aspiration. No pain or paraesthesias with injection. Patient tolerated procedure well. Vital signs stable. Amalia Greenhouse, MD

## 2023-10-01 NOTE — Transfer of Care (Deleted)
 Immediate Anesthesia Transfer of Care Note  Patient: Abigail Gray  Procedure(s) Performed: REPEAT CESAREAN SECTION WITH BILATERAL TUBAL LIGATION EDC: 10/21/23 ALLERG: DOXYCYCLINE, SCRUB CARE PROVIDONE IODINE PREVIOUS X 1 (Bilateral)  Patient Location: PACU  Anesthesia Type:Epidural  Level of Consciousness: awake, alert , and oriented  Airway & Oxygen Therapy: Patient Spontanous Breathing  Post-op Assessment: Report given to RN and Post -op Vital signs reviewed and stable  Post vital signs: Reviewed and stable  Last Vitals:  Vitals Value Taken Time  BP 108/79   Temp    Pulse 133   Resp 16   SpO2 98     Last Pain:  Vitals:   10/01/23 1427  TempSrc: Oral  PainSc:          Complications: No notable events documented.

## 2023-10-01 NOTE — Anesthesia Postprocedure Evaluation (Signed)
 Anesthesia Post Note  Patient: Abigail Gray  Procedure(s) Performed: REPEAT CESAREAN SECTION WITH BILATERAL TUBAL LIGATION EDC: 10/21/23 ALLERG: DOXYCYCLINE, SCRUB CARE PROVIDONE IODINE PREVIOUS X 1 (Bilateral)     Patient location during evaluation: PACU Anesthesia Type: Spinal Level of consciousness: awake and alert Pain management: pain level controlled Vital Signs Assessment: post-procedure vital signs reviewed and stable Respiratory status: spontaneous breathing, nonlabored ventilation and respiratory function stable Cardiovascular status: blood pressure returned to baseline Postop Assessment: no apparent nausea or vomiting, spinal receding, no headache and no backache Anesthetic complications: no   No notable events documented.  Last Vitals:  Vitals:   10/01/23 1715 10/01/23 1730  BP: 120/73 115/68  Pulse: 74 82  Resp: 15 19  Temp:    SpO2: 99% 99%     LLE Motor Response: Purposeful movement (10/01/23 1730) LLE Sensation: Tingling (10/01/23 1730) RLE Motor Response: Purposeful movement (10/01/23 1730) RLE Sensation: Tingling (10/01/23 1730)     Epidural/Spinal Function Cutaneous sensation: Tingles (10/01/23 1700), Patient able to flex knees: No (10/01/23 1700), Patient able to lift hips off bed: No (10/01/23 1700), Back pain beyond tenderness at insertion site: No (10/01/23 1700), Progressively worsening motor and/or sensory loss: No (10/01/23 1700), Bowel and/or bladder incontinence post epidural: No (10/01/23 1700)  Shanda Howells

## 2023-10-01 NOTE — Op Note (Signed)
 PROCEDURE DATE: 10/01/23   PREOPERATIVE DIAGNOSIS: Intrauterine pregnancy at  37.1 wga, Indication: labor, repeat and desires permanent sterility. Di-di twins. IUGR.   POSTOPERATIVE DIAGNOSIS: The same and 6cm x2cm uterine window   PROCEDURE:  Repeat Low Transverse Cesarean Section with BTL,    SURGEON:  Dr. Belva Agee  ASSISTANT: Dr. Candelaria Celeste An experienced assistant was required given the standard of surgical care given the complexity of the case.  This assistant was needed for exposure, dissection, suctioning, retraction, instrument exchange, assisting with delivery with administration of fundal pressure, and for overall help during the procedure.    INDICATIONS: This is a 33 yo G2P1001 at 66.1 wga requiring cesarean section secondary to labor, repeat, and desires permanent sterility. Decision made to proceed with LTCS. The risks of cesarean section discussed with the patient included but were not limited to: bleeding which may require transfusion or reoperation; infection which may require antibiotics; injury to bowel, bladder, ureters or other surrounding organs; injury to the fetus; need for additional procedures including hysterectomy in the event of a life-threatening hemorrhage; placental abnormalities wth subsequent pregnancies, incisional problems, thromboembolic phenomenon and other postoperative/anesthesia complications. We also discussed bilateral tubal ligation in detail. The alternatives to permanent sterilization were reviewed and it was discussed that this is considered permanent. We reviewed the risks in detail including regret, failure and ectopic pregnancy.   The patient agreed with the proposed plan, giving informed consent for the procedure.     FINDINGS:  Viable female infant x 2 in vertex presentation, APGARspending,  Weight pending, Amniotic fluid clear,  Intact placenta, three vessel cord.  6 x2 cm uterine window. Grossly normal uterus, ovaries and fallopian tubes  otherwise .   ANESTHESIA:    Epidural ESTIMATED BLOOD LOSS: 464cc SPECIMENS: Placenta for pathology COMPLICATIONS: None immediate   PROCEDURE IN DETAIL:     The patient received intravenous antibiotics (2g Ancef) and had sequential compression devices applied to her lower extremities while in the preoperative area.  She was then taken to the operating room where epidural anesthesia was dosed up to surgical level and was found to be adequate. She was then placed in a dorsal supine position with a leftward tilt, and prepped and draped in a sterile manner.  A foley catheter was placed into her bladder and attached to constant gravity.  After an adequate timeout was performed, a Pfannenstiel skin incision was made with scalpel and carried through to the underlying layer of fascia. The fascia was incised in the midline and this incision was extended bilaterally using the Mayo scissors. Kocher clamps were applied to the superior aspect of the fascial incision and the underlying rectus muscles were dissected off bluntly. A similar process was carried out on the inferior aspect of the facial incision. The rectus muscles were separated in the midline bluntly and the peritoneum was entered bluntly.The uterus was noted to have a 6cm uterine window at prior scar. An alice was used to break amnioc sac and the window was extended bilaterally bluntly. The bladder blade was then removed. Baby A was successfully delivered, and cord was clamped and cut and infant was handed over to awaiting neonatology team. Baby B was then delivered in the same fashion. Uterine massage was then administered and the placentas delivered intact with three-vessel cords. Cord gases were taken. The uterus was cleared of clot and debris.  The uterine window was closed with 0 vicryl.  A second imbricating suture of 0-vicryl was used to reinforce the incision  and aid in hemostasis. A bilateral tubal ligation was performed via via cautery taking  care to ensure good hemostasis and fallopian tube desiccation.   Good hemostasis was noted before and after uterus and fallopian tubes placed back into abdomen. The fascia was closed with 0-Vicryl in a running fashion with good restoration of anatomy.  The subcutaneus tissue was irrigated and was reapproximated using three interrupted plain gut stitches.  The skin was closed with 4-0 Vicryl in a subcuticular fashion.   Final EBL was 464cc (all surgical site and was hemostatic at end of procedure) without any further bleeding on exam.  [place].    D/w pt events of delivery and uterine window and risks in future.  Pt tolerated the procedure well. All sponge/lap/needle counts were correct  X 2. Pt taken to recovery room in stable condition.     Belva Agee MD

## 2023-10-01 NOTE — Transfer of Care (Signed)
 Immediate Anesthesia Transfer of Care Note  Patient: Abigail Gray  Procedure(s) Performed: REPEAT CESAREAN SECTION WITH BILATERAL TUBAL LIGATION EDC: 10/21/23 ALLERG: DOXYCYCLINE, SCRUB CARE PROVIDONE IODINE PREVIOUS X 1 (Bilateral)  Patient Location: PACU  Anesthesia Type:Spinal  Level of Consciousness: awake  Airway & Oxygen Therapy: Patient Spontanous Breathing  Post-op Assessment: Report given to RN and Post -op Vital signs reviewed and stable  Post vital signs: Reviewed and stable  Last Vitals:  Vitals Value Taken Time  BP 114/63 10/01/23 1648  Temp    Pulse 70 10/01/23 1650  Resp 22 10/01/23 1650  SpO2 94 % 10/01/23 1650  Vitals shown include unfiled device data.  Last Pain:  Vitals:   10/01/23 1427  TempSrc: Oral  PainSc:          Complications: No notable events documented.

## 2023-10-01 NOTE — Lactation Note (Signed)
 This note was copied from a baby's chart. Lactation Consultation Note  Patient Name: Abigail Gray [id number] Date: 10/01/2023 Age:33 hours  Mom chooses to formula feed.   Maternal Data    Feeding Nipple Type: Slow - flow  LATCH Score                    Lactation Tools Discussed/Used    Interventions    Discharge    Consult Status Consult Status: Complete    Abigail Gray G 10/01/2023, 8:30 PM

## 2023-10-01 NOTE — H&P (Signed)
 Abigail Gray is a 33 y.o. female presenting for labor. 2cm in office. 3cm now on arrival to MAU contractions every 2 min - painfully. Desires RCS with BTL. Di/di twins.Twin A IUGR dopplers WNL today 9%ile. Twin B 18th%ile.  Hx cHTN not on meds. Bp elevation in MAU likely s/s pain partially - no severe range Bps. Hx pp anxiedty and not on meds.  OB History     Gravida  2   Para  1   Term  1   Preterm  0   AB  0   Living  1      SAB  0   IAB  0   Ectopic  0   Multiple  0   Live Births  1          Past Medical History:  Diagnosis Date   Anemia    Depression    Headache    Hemorrhoids    History of gestational hypertension    Kidney stones    UTI (lower urinary tract infection)    Past Surgical History:  Procedure Laterality Date   CESAREAN SECTION N/A 11/04/2022   Procedure: CESAREAN SECTION;  Surgeon: Harold Hedge, MD;  Location: [facility] LD ORS;  Service: Obstetrics;  Laterality: N/A;   NO PAST SURGERIES     Family History: family history includes Cancer in her father and mother. Social History:  reports that she has quit smoking. Her smoking use included cigarettes. She has never used smokeless tobacco. She reports that she does not currently use drugs after having used the following drugs: Marijuana. She reports that she does not drink alcohol.     Maternal Diabetes: No Genetic Screening: Normal Maternal Ultrasounds/Referrals: IUGR Fetal Ultrasounds or other Referrals:  None Maternal Substance Abuse:  No Significant Maternal Medications:  None Significant Maternal Lab Results:  None Number of Prenatal Visits:greater than 3 verified prenatal visits  Review of Systems History Dilation: 3 Station: -2 Exam by:: Georgina Snell, RN Blood pressure (!) 153/94, pulse 93, temperature 98.1 F (36.7 C), temperature source Oral, resp. rate 16, last menstrual period 01/14/2023, SpO2 99%, unknown if currently breastfeeding. Exam Physical Exam  NAD,  A&O NWOB Abd soft, nondistended, gravid  Prenatal labs: ABO, Rh: --/--/O POS (05/10 0454) Antibody: Negative (10/16 0000) Rubella: Nonimmune (10/16 0000) RPR: Nonreactive (10/16 0000)  HBsAg: Negative (10/16 0000)  HIV: Non-reactive (10/16 0000)  GBS: NEGATIVE/-- (05/10 0039)   Assessment/Plan: 33 yo presenting for labor and BP elevation at 37 wga - for repeat CS with requested bilateral tubal ligation. Risks discussed including infection, bleeding, damage to surrounding structures, the need for additional procedures including hysterectomy, and the possibility of uterine rupture with neonatal morbidity/mortality, scarring, and abnormal placentation with subsequent pregnancies. We also discussed bilateral tubal ligation in detail. The alternatives to permanent sterilization were reviewed and it was discussed that this is considered permanent. We reviewed the risks in detail including regret, failure and ectopic pregnancy. She understands and agrees to proceed. 2g ancef on call to OR.     Ranae Pila 10/01/2023, 2:56 PM

## 2023-10-01 NOTE — Anesthesia Preprocedure Evaluation (Signed)
 Anesthesia Evaluation  Patient identified by MRN, date of birth, ID band Patient awake    Reviewed: Allergy & Precautions, Patient's Chart, lab work & pertinent test results  History of Anesthesia Complications Negative for: history of anesthetic complications  Airway Mallampati: II  TM Distance: >3 FB Neck ROM: Full    Dental  (+) Poor Dentition,    Pulmonary former smoker   Pulmonary exam normal        Cardiovascular hypertension, Normal cardiovascular exam     Neuro/Psych  Headaches   Depression       GI/Hepatic negative GI ROS, Neg liver ROS,,,  Endo/Other  negative endocrine ROS    Renal/GU negative Renal ROS     Musculoskeletal negative musculoskeletal ROS (+)    Abdominal   Peds  Hematology negative hematology ROS (+)   Anesthesia Other Findings Day of surgery medications reviewed with patient.  Reproductive/Obstetrics (+) Pregnancy (Hx of C/S x1, twins)                              Anesthesia Physical Anesthesia Plan  ASA: 2  Anesthesia Plan: Spinal   Post-op Pain Management: Ofirmev IV (intra-op)*   Induction:   PONV Risk Score and Plan: 4 or greater and Treatment may vary due to age or medical condition, Ondansetron and Dexamethasone  Airway Management Planned: Natural Airway  Additional Equipment: None  Intra-op Plan:   Post-operative Plan:   Informed Consent: I have reviewed the patients History and Physical, chart, labs and discussed the procedure including the risks, benefits and alternatives for the proposed anesthesia with the patient or authorized representative who has indicated his/her understanding and acceptance.       Plan Discussed with: CRNA  Anesthesia Plan Comments:          Anesthesia Quick Evaluation

## 2023-10-02 ENCOUNTER — Encounter (HOSPITAL_COMMUNITY)
Admission: RE | Admit: 2023-10-02 | Discharge: 2023-10-02 | Disposition: A | Discharge status: Home / Self Care | Source: Ambulatory Visit | Attending: Obstetrics and Gynecology | Admitting: Obstetrics and Gynecology

## 2023-10-02 ENCOUNTER — Encounter (HOSPITAL_COMMUNITY): Payer: Self-pay | Admitting: Obstetrics and Gynecology

## 2023-10-02 HISTORY — DX: Personal history of other complications of pregnancy, childbirth and the puerperium: Z87.59

## 2023-10-02 LAB — CBC
HCT: 27.9 % — ABNORMAL LOW (ref 36.0–46.0)
Hemoglobin: 9.2 g/dL — ABNORMAL LOW (ref 12.0–15.0)
MCH: 27.6 pg (ref 26.0–34.0)
MCHC: 33 g/dL (ref 30.0–36.0)
MCV: 83.8 fL (ref 80.0–100.0)
Platelets: 303 10*3/uL (ref 150–400)
RBC: 3.33 MIL/uL — ABNORMAL LOW (ref 3.87–5.11)
RDW: 17.4 % — ABNORMAL HIGH (ref 11.5–15.5)
WBC: 17.7 10*3/uL — ABNORMAL HIGH (ref 4.0–10.5)
nRBC: 0 % (ref 0.0–0.2)

## 2023-10-02 LAB — RPR: RPR Ser Ql: NONREACTIVE

## 2023-10-02 NOTE — Progress Notes (Signed)
 POD # 1  Twins  S: patient is doing well. No concerns.  O:  BP 117/69 (BP Location: Left Arm)   Pulse 73   Temp 98.2 F (36.8 C) (Oral)   Resp 18   LMP 01/14/2023   SpO2 100%   Breastfeeding Unknown  Results for orders placed or performed during the hospital encounter of 10/01/23 (from the past 24 hours)  CBC     Status: Abnormal   Collection Time: 10/01/23  2:42 PM  Result Value Ref Range   WBC 15.0 (H) 4.0 - 10.5 K/uL   RBC 4.15 3.87 - 5.11 MIL/uL   Hemoglobin 11.4 (L) 12.0 - 15.0 g/dL   HCT 56.2 (L) 13.0 - 86.5 %   MCV 83.9 80.0 - 100.0 fL   MCH 27.5 26.0 - 34.0 pg   MCHC 32.8 30.0 - 36.0 g/dL   RDW 78.4 (H) 69.6 - 29.5 %   Platelets 342 150 - 400 K/uL   nRBC 0.0 0.0 - 0.2 %  RPR     Status: None   Collection Time: 10/01/23  2:42 PM  Result Value Ref Range   RPR Ser Ql NON REACTIVE NON REACTIVE  Type and screen [facility]     Status: None   Collection Time: 10/01/23  2:42 PM  Result Value Ref Range   ABO/RH(D) O POS    Antibody Screen NEG    Sample Expiration      10/04/2023,2359 Performed at [facility], [address], [place], [place] [address]   HIV Antibody (routine testing w rflx)     Status: None   Collection Time: 10/01/23  2:42 PM  Result Value Ref Range   HIV Screen 4th Generation wRfx Non Reactive Non Reactive  Comprehensive metabolic panel     Status: Abnormal   Collection Time: 10/01/23  3:30 PM  Result Value Ref Range   Sodium 136 135 - 145 mmol/L   Potassium 3.9 3.5 - 5.1 mmol/L   Chloride 105 98 - 111 mmol/L   CO2 19 (L) 22 - 32 mmol/L   Glucose, Bld 94 70 - 99 mg/dL   BUN 8 6 - 20 mg/dL   Creatinine, Ser 2.44 0.44 - 1.00 mg/dL   Calcium 9.9 8.9 - 01.0 mg/dL   Total Protein 6.7 6.5 - 8.1 g/dL   Albumin 2.8 (L) 3.5 - 5.0 g/dL   AST 18 15 - 41 U/L   ALT 9 0 - 44 U/L   Alkaline Phosphatase 161 (H) 38 - 126 U/L   Total Bilirubin 0.3 0.0 - 1.2 mg/dL   GFR, Estimated >27 >25 mL/min   Anion gap 12 5 - 15  CBC      Status: Abnormal   Collection Time: 10/02/23  4:28 AM  Result Value Ref Range   WBC 17.7 (H) 4.0 - 10.5 K/uL   RBC 3.33 (L) 3.87 - 5.11 MIL/uL   Hemoglobin 9.2 (L) 12.0 - 15.0 g/dL   HCT 36.6 (L) 44.0 - 34.7 %   MCV 83.8 80.0 - 100.0 fL   MCH 27.6 26.0 - 34.0 pg   MCHC 33.0 30.0 - 36.0 g/dL   RDW 42.5 (H) 95.6 - 38.7 %   Platelets 303 150 - 400 K/uL   nRBC 0.0 0.0 - 0.2 %   Abdomen is soft and non tender Pressure dressing removed and dry  Honeycomb is dry   IMPRESSION: POD # 1  Doing very well Routine care Circs on hold  due to blood sugar issues Possible discharge home tomorrow

## 2023-10-02 NOTE — Progress Notes (Signed)
 Patient says she has voided several times today but not into the hat per RN request. RN asked her to continue to measure for at least one more void to make sure output is adequate.

## 2023-10-02 NOTE — Progress Notes (Signed)
 CSW received consult for hx of Anxiety and Depression.  CSW met with MOB to offer support and complete assessment. CSW entered the room, introduced herself and acknowledged that FOB was present. MOB gave CSW verbal permission to speak about anything while FOB was present. CSW explained her role and the reason for the visit. MOB presented bonding/holing the infant and was polite, easy to engage, receptive to meeting with CSW, and appeared forthcoming.   CSW inquired about MOB's mental health history. MOB reported being diagnosed with anxiety and depression 2 years ago during her first pregnancy. MOB reported beginning Wellbutrin for support; however she declined current use. MOB reported PPA, after her first pregnancy with worries that included never wanting both parents to sleep due to not knowing if the infant was breathing. MOB reported relying on her support system for more hands on support and her PPA only lasted for about a month and half. MOB reported her supports as FOB and her sisters. CSW provided education regarding the baby blues period vs. perinatal mood disorders, discussed treatment and gave resources for mental health follow up if concerns arise.  CSW recommends self-evaluation during the postpartum time period using the New Mom Checklist from Postpartum Progress and encouraged MOB to contact a medical professional if symptoms are noted at any time. CSW assessed for safety with MOB SI/HI; MOB denied SI/HI. CSW did not assess for DV; FOB was present.  CSW asked MOB has she selected a pediatrician for the infant's follow up visits; MOB said [facility].  MOB reported having all essential items for the infant including a carseat, bassinet and crib for safe sleeping. CSW provided review of Sudden Infant Death Syndrome (SIDS) precautions.  CSW identifies no further need for intervention and no barriers to discharge at this time.  Enos Fling, Theresia Majors Clinical Social  Worker [phone]

## 2023-10-03 LAB — SURGICAL PATHOLOGY

## 2023-10-03 MED ORDER — ACETAMINOPHEN 325 MG PO TABS: 650.0000 mg | ORAL_TABLET | Freq: Four times a day (QID) | ORAL | 0 refills | Status: AC | PRN

## 2023-10-03 MED ORDER — FERROUS SULFATE 325 (65 FE) MG PO TABS
325.0000 mg | ORAL_TABLET | ORAL | Status: DC
  Administered 2023-10-03: 325 mg via ORAL
  Filled 2023-10-03: qty 1

## 2023-10-03 MED ORDER — OXYCODONE HCL 5 MG PO TABS
5.0000 mg | ORAL_TABLET | ORAL | 0 refills | Status: DC | PRN
Start: 2023-10-03 — End: 2023-11-28

## 2023-10-03 MED ORDER — IBUPROFEN 600 MG PO TABS: 600.0000 mg | ORAL_TABLET | Freq: Four times a day (QID) | ORAL | 0 refills | Status: AC

## 2023-10-03 MED ORDER — DOCUSATE SODIUM 100 MG PO CAPS
100.0000 mg | ORAL_CAPSULE | Freq: Every day | ORAL | Status: DC
  Administered 2023-10-03: 100 mg via ORAL
  Filled 2023-10-03: qty 1

## 2023-10-03 NOTE — Progress Notes (Signed)
 Postpartum Progress Note  Postpartum Day 2 s/p repeat Cesarean section with bilateral tubal ligation, twins, IUGR baby A.  She has a history of cHTN, not requiring medications.   Subjective:  Patient reports .  She reports well controlled pain, ambulating without difficulty, voiding spontaneously, tolerating PO.  She reports Positive flatus.  Vaginal bleeding is appripriate.  Objective: Blood pressure 132/63, pulse 77, temperature 98 F (36.7 C), temperature source Oral, resp. rate 18, last menstrual period 01/14/2023, SpO2 99%, unknown if currently breastfeeding.  Physical Exam:  General: alert and no distress Lochia: appropriate Abdomen: soft, ATTP Uterine Fundus: firm Incision: dressing in place DVT Evaluation: No evidence of DVT seen on physical exam.  Recent Labs    10/01/23 1442 10/02/23 0428  HGB 11.4* 9.2*  HCT 34.8* 27.9*    Assessment/Plan: Postpartum Day 2, s/p C-section cHTN - normotensive postpartum. No antihypertensives required.  Lactation following Acute blood loss anemia, clinically significant - Fe/Colace. No signs or symptoms of anemia.  Baby boys - desires circ. Will perform if cleared by nursery.  Doing well, continue routine postpartum care. Anticipate discharge home today   LOS: 2 days   Lyn Henri 10/03/2023, 7:52 AM

## 2023-10-03 NOTE — Discharge Summary (Signed)
 Obstetric Discharge Summary  Abigail Gray is a 33 y.o. female that presented on 10/01/2023 for labor with history of prior C section and current twin pregnancy with new onset IUGR of twin A.  She was admitted for repeat C section with bilateral tubal ligation and delivered viable twin boys on 10/01/2023. She also has ahistory of chronic hypertension, not requiring medications during pregnancy.  Her postpartum course was uncomplicated and on PPD#2, she reported well controlled pain, spontaneous voiding, ambulating without difficulty, and tolerating PO.  She was stable for discharge home on  with plans for in-office follow up for a 1 week blood pressure check.  Hemoglobin  Date Value Ref Range Status  10/02/2023 9.2 (L) 12.0 - 15.0 g/dL Final   HCT  Date Value Ref Range Status  10/02/2023 27.9 (L) 36.0 - 46.0 % Final    Physical Exam:  General: alert and no distress Lochia: appropriate Uterine Fundus: firm Incision: healing well DVT Evaluation: No evidence of DVT seen on physical exam.  Discharge Diagnoses: s/p C section, cHTN, twins, delivered full term  Discharge Information: Date: 10/03/2023 Activity: Pelvic rest, as tolerated Diet: routine Medications: Tylenol, motrin, oxycodone Condition: stable Instructions: Refer to practice specific booklet.  Discussed prior to discharge.  Discharge to: Home  Follow-up Information     [place] Follow up.   Why: Please follow up for a 1 week blood pressure check. Contact information: [address] [place] [address] [phone]                 Newborn Data:   [place] Lions [[id number]]  Live born female  Birth Weight: 6 lb 2.8 oz (2800 g) APGAR: 7, 9  Newborn Delivery   Birth date/time: 10/01/2023 15:58:57 Delivery type: C-Section, Low Transverse Trial of labor: No C-section categorization: Repeat       Sheppard Evens [[id number]]  Live born female  Birth  Weight: 6 lb 4.5 oz (2850 g) APGAR: 9, 9  Newborn Delivery   Birth date/time: 10/01/2023 16:00:54 Delivery type: C-Section, Low Transverse Trial of labor: No C-section categorization: Repeat     Home with mother.  Lyn Henri 10/03/2023, 3:04 PM

## 2023-10-04 DIAGNOSIS — Z98891 History of uterine scar from previous surgery: Secondary | ICD-10-CM

## 2023-10-11 ENCOUNTER — Telehealth (HOSPITAL_COMMUNITY): Payer: Self-pay | Admitting: *Deleted

## 2023-10-11 NOTE — Telephone Encounter (Signed)
 10/11/2023  Name: Abigail Gray MRN: [id number] DOB: Jun 18, 1991  Reason for Call:  Transition of Care Hospital Discharge Call  Contact Status: Patient Contact Status: Unable to contact ("mailbox is full", unable to leave a message)  Language assistant needed: Interpreter Mode: Interpreter Not Needed        Follow-Up Questions:    Dimple Francis Postnatal Depression Scale:  In the Past 7 Days:    PHQ2-9 Depression Scale:     Discharge Follow-up:    Post-discharge interventions: NA  Pearlie Bougie, RN 10/11/2023 14:09

## 2023-11-24 ENCOUNTER — Emergency Department (HOSPITAL_COMMUNITY)
Admission: EM | Admit: 2023-11-24 | Discharge: 2023-11-24 | Disposition: A | Discharge status: Home / Self Care | Attending: Emergency Medicine | Admitting: Emergency Medicine

## 2023-11-24 ENCOUNTER — Other Ambulatory Visit: Payer: Self-pay

## 2023-11-24 ENCOUNTER — Encounter (HOSPITAL_COMMUNITY): Payer: Self-pay

## 2023-11-24 ENCOUNTER — Emergency Department (HOSPITAL_COMMUNITY)

## 2023-11-24 DIAGNOSIS — I10 Essential (primary) hypertension: Secondary | ICD-10-CM | POA: Diagnosis not present

## 2023-11-24 DIAGNOSIS — N132 Hydronephrosis with renal and ureteral calculous obstruction: Secondary | ICD-10-CM | POA: Insufficient documentation

## 2023-11-24 DIAGNOSIS — R000 Tachycardia, unspecified: Secondary | ICD-10-CM | POA: Diagnosis not present

## 2023-11-24 DIAGNOSIS — N200 Calculus of kidney: Secondary | ICD-10-CM

## 2023-11-24 DIAGNOSIS — R1030 Lower abdominal pain, unspecified: Secondary | ICD-10-CM | POA: Diagnosis present

## 2023-11-24 DIAGNOSIS — N23 Unspecified renal colic: Secondary | ICD-10-CM

## 2023-11-24 LAB — COMPREHENSIVE METABOLIC PANEL WITH GFR
ALT: 17 U/L (ref 0–44)
AST: 22 U/L (ref 15–41)
Albumin: 3.9 g/dL (ref 3.5–5.0)
Alkaline Phosphatase: 80 U/L (ref 38–126)
Anion gap: 9 (ref 5–15)
BUN: 11 mg/dL (ref 6–20)
CO2: 25 mmol/L (ref 22–32)
Calcium: 9.3 mg/dL (ref 8.9–10.3)
Chloride: 104 mmol/L (ref 98–111)
Creatinine, Ser: 0.69 mg/dL (ref 0.44–1.00)
GFR, Estimated: >60 mL/min (ref >60)
Glucose, Bld: 119 mg/dL — ABNORMAL HIGH (ref 70–99)
Potassium: 4 mmol/L (ref 3.5–5.1)
Sodium: 138 mmol/L (ref 135–145)
Total Bilirubin: 0.3 mg/dL (ref 0.0–1.2)
Total Protein: 7.3 g/dL (ref 6.5–8.1)

## 2023-11-24 LAB — CBC
HCT: 43 % (ref 36.0–46.0)
Hemoglobin: 13.6 g/dL (ref 12.0–15.0)
MCH: 26.2 pg (ref 26.0–34.0)
MCHC: 31.6 g/dL (ref 30.0–36.0)
MCV: 82.9 fL (ref 80.0–100.0)
Platelets: 336 10*3/uL (ref 150–400)
RBC: 5.19 MIL/uL — ABNORMAL HIGH (ref 3.87–5.11)
RDW: 17.1 % — ABNORMAL HIGH (ref 11.5–15.5)
WBC: 9.9 10*3/uL (ref 4.0–10.5)
nRBC: 0 % (ref 0.0–0.2)

## 2023-11-24 LAB — LIPASE, BLOOD: Lipase: 28 U/L (ref 11–51)

## 2023-11-24 LAB — HCG, SERUM, QUALITATIVE: Preg, Serum: NEGATIVE

## 2023-11-24 MED ORDER — HYDROMORPHONE HCL 1 MG/ML IJ SOLN
1.0000 mg | Freq: Once | INTRAMUSCULAR | Status: AC
  Administered 2023-11-24: 1 mg via INTRAVENOUS
  Filled 2023-11-24: qty 1

## 2023-11-24 MED ORDER — ONDANSETRON HCL 4 MG/2ML IJ SOLN
4.0000 mg | Freq: Once | INTRAMUSCULAR | Status: AC
  Administered 2023-11-24: 4 mg via INTRAVENOUS
  Filled 2023-11-24: qty 2

## 2023-11-24 MED ORDER — KETOROLAC TROMETHAMINE 30 MG/ML IJ SOLN
30.0000 mg | Freq: Once | INTRAMUSCULAR | Status: AC
  Administered 2023-11-24: 30 mg via INTRAVENOUS
  Filled 2023-11-24: qty 1

## 2023-11-24 MED ORDER — ONDANSETRON 4 MG PO TBDP: 4.0000 mg | ORAL_TABLET | Freq: Three times a day (TID) | ORAL | 0 refills | Status: AC | PRN

## 2023-11-24 MED ORDER — SODIUM CHLORIDE 0.9 % IV BOLUS
1000.0000 mL | Freq: Once | INTRAVENOUS | Status: AC
Start: 2023-11-24 — End: 2023-11-24
  Administered 2023-11-24: 1000 mL via INTRAVENOUS

## 2023-11-24 MED ORDER — TAMSULOSIN HCL 0.4 MG PO CAPS: 0.4000 mg | ORAL_CAPSULE | Freq: Every day | ORAL | 0 refills | Status: AC | PRN

## 2023-11-24 MED ORDER — FENTANYL CITRATE PF 50 MCG/ML IJ SOSY
100.0000 ug | PREFILLED_SYRINGE | Freq: Once | INTRAMUSCULAR | Status: AC
  Administered 2023-11-24: 100 ug via INTRAVENOUS
  Filled 2023-11-24: qty 2

## 2023-11-24 MED ORDER — OXYCODONE HCL 5 MG PO TABS: 5.0000 mg | ORAL_TABLET | Freq: Four times a day (QID) | ORAL | 0 refills | Status: DC | PRN

## 2023-11-24 MED ORDER — IBUPROFEN 600 MG PO TABS: 600.0000 mg | ORAL_TABLET | Freq: Four times a day (QID) | ORAL | 0 refills | Status: AC | PRN

## 2023-11-24 MED ORDER — MAGNESIUM SULFATE 2 GM/50ML IV SOLN
2.0000 g | Freq: Once | INTRAVENOUS | Status: AC
  Administered 2023-11-24: 2 g via INTRAVENOUS
  Filled 2023-11-24: qty 50

## 2023-11-24 NOTE — ED Notes (Signed)
 MD made aware of pt request for pain medication as she is now rating the pain 10/10 and crying in pain.

## 2023-11-24 NOTE — Discharge Instructions (Addendum)
 Your CT scan showed that you are passing a 2 mm kidney stone on the right side.  The stone the size should likely pass on its own.  Continue drinking lots of water  and take the medicine as prescribed.  Please do not breast-feed or pump milk while taking opioid narcotics such as oxycodone .  This can be passed within the milk.  You told me that you are not breast-feeding or pumping milk.  Please do not drive or perform dangerous activities after taking narcotics.  You did receive IV opioids for pain here in the emergency department today.

## 2023-11-24 NOTE — ED Provider Notes (Signed)
 [place] EMERGENCY DEPARTMENT AT [facility] Provider Note   CSN: [id number] Arrival date & time: 11/24/23  1008     History  Chief Complaint  Patient presents with   Abdominal Pain   Back Pain    Abigail Gray is a 33 y.o. female presented to ED with acute onset lower back pain.  Patient reports this feels like prior kidney stones and kidney infection she had in the past.  Her symptoms began abruptly last night.  The pain is 10 out of 10.  She feels nauseated.  Medical history is notable for twin delivery 10/01/23, with C-section at time of bilateral tubal ligation performed.  She has a history of chronic hypertension did not require medications during pregnancy.  She confirms that she has not breast or bottlefeeding.  She is only formula feeding her infant.  HPI     Home Medications Prior to Admission medications   Medication Sig Start Date End Date Taking? Authorizing Provider  ibuprofen  (ADVIL ) 600 MG tablet Take 1 tablet (600 mg total) by mouth every 6 (six) hours as needed for up to 30 doses for mild pain (pain score 1-3) or moderate pain (pain score 4-6). 11/24/23  Yes Arvilla Birmingham, MD  ondansetron  (ZOFRAN -ODT) 4 MG disintegrating tablet Take 1 tablet (4 mg total) by mouth every 8 (eight) hours as needed for up to 15 doses for nausea or vomiting. 11/24/23  Yes Kiran Lapine, Janalyn Me, MD  oxyCODONE  (ROXICODONE ) 5 MG immediate release tablet Take 1 tablet (5 mg total) by mouth every 6 (six) hours as needed for up to 15 doses for severe pain (pain score 7-10). 11/24/23  Yes Sharisa Toves, Janalyn Me, MD  tamsulosin  (FLOMAX ) 0.4 MG CAPS capsule Take 1 capsule (0.4 mg total) by mouth daily as needed for up to 15 doses. 11/24/23  Yes Arvilla Birmingham, MD  acetaminophen  (TYLENOL ) 325 MG tablet Take 2 tablets (650 mg total) by mouth every 6 (six) hours as needed. 10/03/23   Gretchen Leavell, MD  ibuprofen  (ADVIL ) 600 MG tablet Take 1 tablet (600 mg total) by mouth every 6 (six) hours.  10/03/23   Gretchen Leavell, MD  ondansetron  (ZOFRAN -ODT) 4 MG disintegrating tablet Take 1 tablet (4 mg total) by mouth every 6 (six) hours as needed for nausea. Patient not taking: Reported on 10/01/2023 08/20/23   Teena Feast, MD  oxyCODONE  (OXY IR/ROXICODONE ) 5 MG immediate release tablet Take 1 tablet (5 mg total) by mouth every 4 (four) hours as needed for moderate pain (pain score 4-6). 10/03/23   Gretchen Leavell, MD  Prenatal Vit-Fe Fumarate-FA (PRENATAL MULTIVITAMIN) TABS tablet Take 1 tablet by mouth daily at 12 noon.    [provider]      Allergies    Doxycycline , Povidone-iodine , and Shellfish allergy    Review of Systems   Review of Systems  Physical Exam Updated Vital Signs BP (!) 134/97   Pulse (!) 51   Temp 97.8 F (36.6 C) (Oral)   Resp 19   LMP 01/14/2023   SpO2 100%   Breastfeeding No  Physical Exam Constitutional:      General: She is in acute distress.  HENT:     Head: Normocephalic and atraumatic.  Eyes:     Conjunctiva/sclera: Conjunctivae normal.     Pupils: Pupils are equal, round, and reactive to light.  Cardiovascular:     Rate and Rhythm: Regular rhythm. Tachycardia present.  Pulmonary:     Effort: Pulmonary  effort is normal. No respiratory distress.  Abdominal:     General: There is no distension.     Tenderness: There is no abdominal tenderness.  Skin:    General: Skin is warm and dry.  Neurological:     General: No focal deficit present.     Mental Status: She is alert. Mental status is at baseline.  Psychiatric:        Mood and Affect: Mood normal.        Behavior: Behavior normal.     ED Results / Procedures / Treatments   Labs (all labs ordered are listed, but only abnormal results are displayed) Labs Reviewed  COMPREHENSIVE METABOLIC PANEL WITH GFR - Abnormal; Notable for the following components:      Result Value   Glucose, Bld 119 (*)    All other components within normal limits  CBC - Abnormal; Notable for  the following components:   RBC 5.19 (*)    RDW 17.1 (*)    All other components within normal limits  LIPASE, BLOOD  HCG, SERUM, QUALITATIVE  URINALYSIS, ROUTINE W REFLEX MICROSCOPIC    EKG None  Radiology CT Renal Stone Study Result Date: 11/24/2023 CLINICAL DATA:  Right flank pain. EXAM: CT ABDOMEN AND PELVIS WITHOUT CONTRAST TECHNIQUE: Multidetector CT imaging of the abdomen and pelvis was performed following the standard protocol without IV contrast. RADIATION DOSE REDUCTION: This exam was performed according to the departmental dose-optimization program which includes automated exposure control, adjustment of the mA and/or kV according to patient size and/or use of iterative reconstruction technique. COMPARISON:  05/04/2015 FINDINGS: Lower chest: No acute findings. Hepatobiliary: No mass visualized on this unenhanced exam. Gallbladder is unremarkable. No evidence of biliary ductal dilatation. Pancreas: No mass or inflammatory process visualized on this unenhanced exam. Spleen:  Within normal limits in size. Adrenals/Urinary tract: A few punctate right renal calculi are seen. Mild right hydronephrosis is seen due to 2 mm calculus at the right UPJ. Unremarkable unopacified urinary bladder. Stomach/Bowel: No evidence of obstruction, inflammatory process, or abnormal fluid collections. Vascular/Lymphatic: No pathologically enlarged lymph nodes identified. No evidence of abdominal aortic aneurysm. Reproductive: Normal size uterus. 2.6 cm benign-appearing left adnexal cyst, presumably physiologic in a reproductive age female. No evidence of inflammatory process or free fluid Other:  None. Musculoskeletal:  No suspicious bone lesions identified. IMPRESSION: Mild right hydronephrosis due to 2 mm calculus at the right UPJ. Punctate right renal calculi. 2.6 cm benign-appearing left adnexal cyst, presumably physiologic in a reproductive age female. No follow-up imaging recommended. Reference: JACR 2020 Feb;  17(2):248-254 Electronically Signed   By: Marlyce Sine M.D.   On: 11/24/2023 12:23    Procedures Procedures    Medications Ordered in ED Medications  fentaNYL  (SUBLIMAZE ) injection 100 mcg (100 mcg Intravenous Given 11/24/23 1037)  ketorolac  (TORADOL ) 30 MG/ML injection 30 mg (30 mg Intravenous Given 11/24/23 1036)  ondansetron  (ZOFRAN ) injection 4 mg (4 mg Intravenous Given 11/24/23 1036)  sodium chloride  0.9 % bolus 1,000 mL (0 mLs Intravenous Stopped 11/24/23 1231)  HYDROmorphone  (DILAUDID ) injection 1 mg (1 mg Intravenous Given 11/24/23 1119)  magnesium sulfate IVPB 2 g 50 mL (0 g Intravenous Stopped 11/24/23 1358)  HYDROmorphone  (DILAUDID ) injection 1 mg (1 mg Intravenous Given 11/24/23 1513)    ED Course/ Medical Decision Making/ A&P Clinical Course as of 11/24/23 1634  Sat Nov 24, 2023  1114 Still sobbing in pain, dilaudid  ordered, renal study [MT]  1259 More comfortable, pain 6/10, discussed renal stone, pt now  resting without distress [MT]  1502 Patient does not want to stay in the hospital that she continues to have significant pain.  She requested 1 additional dose of IV pain medicine and then would prefer to try to manage her pain at home with oral medications.  I did offer hospitalization for her pain if it is truly intractable, but she does not want to stay. [MT]    Clinical Course User Index [MT] Sota Hetz, Janalyn Me, MD                                 Medical Decision Making Amount and/or Complexity of Data Reviewed Labs: ordered. Radiology: ordered.  Risk Prescription drug management.   This patient presents to the ED with concern for acute onset abdominal pain. This involves an extensive number of treatment options, and is a complaint that carries with it a high risk of complications and morbidity.  The differential diagnosis includes ureteral colic versus biliary colic versus UTI versus other  Co-morbidities that complicate the patient evaluation: History of kidney  stones  I ordered and personally interpreted labs.  The pertinent results include: UA with hemoglobin noted.  Labs otherwise unremarkable  I ordered imaging studies including CT abdomen pelvis I independently visualized and interpreted imaging which showed 2 mm right UPJ stone I agree with the radiologist interpretation  I ordered medication including IV pain medication, fluids, Toradol  for her ureteral colic, IV magnesium for colic  I have reviewed the patients home medicines and have made adjustments as needed  Test Considered: Doubt PID, AAA, mesenteric ischemia, or other life-threatening cause of intra-abdominal pain  After the interventions noted above, I reevaluated the patient and found that they have: improved   Dispostion:  After consideration of the diagnostic results and the patients response to treatment, I feel that the patent would benefit from outpatient follow-up.         Final Clinical Impression(s) / ED Diagnoses Final diagnoses:  Ureteral colic  Kidney stone    Rx / DC Orders ED Discharge Orders          Ordered    oxyCODONE  (ROXICODONE ) 5 MG immediate release tablet  Every 6 hours PRN        11/24/23 1519    ondansetron  (ZOFRAN -ODT) 4 MG disintegrating tablet  Every 8 hours PRN        11/24/23 1519    ibuprofen  (ADVIL ) 600 MG tablet  Every 6 hours PRN        11/24/23 1519    tamsulosin  (FLOMAX ) 0.4 MG CAPS capsule  Daily PRN        11/24/23 1519              Arvilla Birmingham, MD 11/24/23 1635

## 2023-11-24 NOTE — ED Notes (Signed)
 Patient transported to CT

## 2023-11-24 NOTE — ED Triage Notes (Signed)
 Pt tearful in triage and states "I think I have a kidney infection" Pt c.o severe bilateral lower back pain that woke her out of her sleep along with lower abd pain, n/v.Pt 2 months pp with twins.

## 2023-11-28 ENCOUNTER — Emergency Department (HOSPITAL_COMMUNITY)
Admission: EM | Admit: 2023-11-28 | Discharge: 2023-11-28 | Disposition: A | Discharge status: Home / Self Care | Attending: Emergency Medicine | Admitting: Emergency Medicine

## 2023-11-28 ENCOUNTER — Emergency Department (HOSPITAL_COMMUNITY)

## 2023-11-28 ENCOUNTER — Other Ambulatory Visit: Payer: Self-pay

## 2023-11-28 ENCOUNTER — Encounter (HOSPITAL_COMMUNITY): Payer: Self-pay | Admitting: *Deleted

## 2023-11-28 DIAGNOSIS — N390 Urinary tract infection, site not specified: Secondary | ICD-10-CM | POA: Diagnosis not present

## 2023-11-28 DIAGNOSIS — R109 Unspecified abdominal pain: Secondary | ICD-10-CM | POA: Diagnosis present

## 2023-11-28 DIAGNOSIS — N200 Calculus of kidney: Secondary | ICD-10-CM | POA: Insufficient documentation

## 2023-11-28 LAB — URINALYSIS, W/ REFLEX TO CULTURE (INFECTION SUSPECTED)
Glucose, UA: NEGATIVE mg/dL
Ketones, ur: NEGATIVE mg/dL
Nitrite: POSITIVE — AB
Protein, ur: >300 mg/dL — AB
Specific Gravity, Urine: 1.025 (ref 1.005–1.030)
WBC, UA: >50 WBC/hpf (ref 0–5)
pH: 6 (ref 5.0–8.0)

## 2023-11-28 LAB — I-STAT CG4 LACTIC ACID, ED: Lactic Acid, Venous: 1 mmol/L (ref 0.5–1.9)

## 2023-11-28 LAB — BASIC METABOLIC PANEL WITH GFR
Anion gap: 13 (ref 5–15)
BUN: 15 mg/dL (ref 6–20)
CO2: 18 mmol/L — ABNORMAL LOW (ref 22–32)
Calcium: 8.8 mg/dL — ABNORMAL LOW (ref 8.9–10.3)
Chloride: 102 mmol/L (ref 98–111)
Creatinine, Ser: 1.18 mg/dL — ABNORMAL HIGH (ref 0.44–1.00)
GFR, Estimated: >60 mL/min (ref >60)
Glucose, Bld: 144 mg/dL — ABNORMAL HIGH (ref 70–99)
Potassium: 3.3 mmol/L — ABNORMAL LOW (ref 3.5–5.1)
Sodium: 133 mmol/L — ABNORMAL LOW (ref 135–145)

## 2023-11-28 LAB — CBC
HCT: 39.4 % (ref 36.0–46.0)
Hemoglobin: 12.9 g/dL (ref 12.0–15.0)
MCH: 25.9 pg — ABNORMAL LOW (ref 26.0–34.0)
MCHC: 32.7 g/dL (ref 30.0–36.0)
MCV: 79 fL — ABNORMAL LOW (ref 80.0–100.0)
Platelets: 239 10*3/uL (ref 150–400)
RBC: 4.99 MIL/uL (ref 3.87–5.11)
RDW: 17.3 % — ABNORMAL HIGH (ref 11.5–15.5)
WBC: 7.8 10*3/uL (ref 4.0–10.5)
nRBC: 0 % (ref 0.0–0.2)

## 2023-11-28 LAB — HCG, SERUM, QUALITATIVE: Preg, Serum: NEGATIVE

## 2023-11-28 MED ORDER — MORPHINE SULFATE (PF) 4 MG/ML IV SOLN
4.0000 mg | Freq: Once | INTRAVENOUS | Status: AC
  Administered 2023-11-28: 4 mg via INTRAVENOUS
  Filled 2023-11-28: qty 1

## 2023-11-28 MED ORDER — CIPROFLOXACIN HCL 500 MG PO TABS: 500.0000 mg | ORAL_TABLET | Freq: Two times a day (BID) | ORAL | 0 refills | Status: AC

## 2023-11-28 MED ORDER — KETOROLAC TROMETHAMINE 30 MG/ML IJ SOLN
15.0000 mg | Freq: Once | INTRAMUSCULAR | Status: AC
  Administered 2023-11-28: 15 mg via INTRAVENOUS
  Filled 2023-11-28: qty 1

## 2023-11-28 MED ORDER — OXYCODONE HCL 5 MG PO TABS: 5.0000 mg | ORAL_TABLET | Freq: Three times a day (TID) | ORAL | 0 refills | Status: AC | PRN

## 2023-11-28 MED ORDER — ACETAMINOPHEN 325 MG PO TABS
650.0000 mg | ORAL_TABLET | Freq: Once | ORAL | Status: AC
  Administered 2023-11-28: 650 mg via ORAL
  Filled 2023-11-28: qty 2

## 2023-11-28 MED ORDER — SODIUM CHLORIDE 0.9 % IV SOLN
2.0000 g | Freq: Once | INTRAVENOUS | Status: AC
  Administered 2023-11-28: 2 g via INTRAVENOUS
  Filled 2023-11-28: qty 20

## 2023-11-28 MED ORDER — HYDROMORPHONE HCL 1 MG/ML IJ SOLN
1.0000 mg | Freq: Once | INTRAMUSCULAR | Status: AC
  Administered 2023-11-28: 1 mg via INTRAVENOUS
  Filled 2023-11-28: qty 1

## 2023-11-28 MED ORDER — METOCLOPRAMIDE HCL 5 MG/ML IJ SOLN
10.0000 mg | Freq: Once | INTRAMUSCULAR | Status: AC
  Administered 2023-11-28: 10 mg via INTRAVENOUS
  Filled 2023-11-28: qty 2

## 2023-11-28 MED ORDER — ONDANSETRON HCL 4 MG/2ML IJ SOLN
4.0000 mg | Freq: Once | INTRAMUSCULAR | Status: AC
  Administered 2023-11-28: 4 mg via INTRAVENOUS
  Filled 2023-11-28: qty 2

## 2023-11-28 MED ORDER — SODIUM CHLORIDE 0.9 % IV BOLUS
1000.0000 mL | Freq: Once | INTRAVENOUS | Status: AC
  Administered 2023-11-28: 1000 mL via INTRAVENOUS

## 2023-11-28 NOTE — ED Provider Notes (Signed)
 [place] EMERGENCY DEPARTMENT AT [facility] Provider Note   CSN: [id number] Arrival date & time: 11/28/23  4098     History  Chief Complaint  Patient presents with   Flank Pain    Abigail SIMERSON is a 33 y.o. female.  With a history of kidney stones who presents to the ED for flank pain.  Recently seen here 5 days ago found to have a 2mm nonobstructing right-sided stone at the [facility].  Has been unable to urinate since last night.  Severe right-sided pain despite oxycodone  at home.  Feels as though she has a fever as well.  Some nausea and vomiting.  No changes in bowel habits.   Flank Pain       Home Medications Prior to Admission medications   Medication Sig Start Date End Date Taking? Authorizing Provider  ciprofloxacin  (CIPRO ) 500 MG tablet Take 1 tablet (500 mg total) by mouth every 12 (twelve) hours for 7 days. 11/28/23 12/05/23 Yes Sallyanne Creamer, DO  oxyCODONE  (ROXICODONE ) 5 MG immediate release tablet Take 1 tablet (5 mg total) by mouth every 8 (eight) hours as needed for up to 2 days for severe pain (pain score 7-10). 11/28/23 11/30/23 Yes Sallyanne Creamer, DO  acetaminophen  (TYLENOL ) 325 MG tablet Take 2 tablets (650 mg total) by mouth every 6 (six) hours as needed. 10/03/23   Gretchen Leavell, MD  ibuprofen  (ADVIL ) 600 MG tablet Take 1 tablet (600 mg total) by mouth every 6 (six) hours. 10/03/23   Gretchen Leavell, MD  ibuprofen  (ADVIL ) 600 MG tablet Take 1 tablet (600 mg total) by mouth every 6 (six) hours as needed for up to 30 doses for mild pain (pain score 1-3) or moderate pain (pain score 4-6). 11/24/23   Arvilla Birmingham, MD  ondansetron  (ZOFRAN -ODT) 4 MG disintegrating tablet Take 1 tablet (4 mg total) by mouth every 6 (six) hours as needed for nausea. Patient not taking: Reported on 10/01/2023 08/20/23   Teena Feast, MD  ondansetron  (ZOFRAN -ODT) 4 MG disintegrating tablet Take 1 tablet (4 mg total) by mouth every 8 (eight) hours as needed for up to 15  doses for nausea or vomiting. 11/24/23   Arvilla Birmingham, MD  Prenatal Vit-Fe Fumarate-FA (PRENATAL MULTIVITAMIN) TABS tablet Take 1 tablet by mouth daily at 12 noon.    [provider]  tamsulosin  (FLOMAX ) 0.4 MG CAPS capsule Take 1 capsule (0.4 mg total) by mouth daily as needed for up to 15 doses. 11/24/23   Arvilla Birmingham, MD      Allergies    Doxycycline , Povidone-iodine , and Shellfish allergy    Review of Systems   Review of Systems  Genitourinary:  Positive for flank pain.    Physical Exam Updated Vital Signs BP 108/76   Pulse (!) 106   Temp 98.1 F (36.7 C) (Oral)   Resp 18   LMP 01/14/2023   SpO2 100%   Breastfeeding No  Physical Exam Vitals and nursing note reviewed.  Constitutional:      General: She is in acute distress.  HENT:     Head: Normocephalic and atraumatic.  Eyes:     Pupils: Pupils are equal, round, and reactive to light.  Cardiovascular:     Rate and Rhythm: Normal rate and regular rhythm.  Pulmonary:     Effort: Pulmonary effort is normal.     Breath sounds: Normal breath sounds.  Abdominal:     Palpations: Abdomen is soft.  Tenderness: There is no abdominal tenderness. There is right CVA tenderness.  Skin:    General: Skin is warm and dry.  Neurological:     Mental Status: She is alert.  Psychiatric:        Mood and Affect: Mood normal.     ED Results / Procedures / Treatments   Labs (all labs ordered are listed, but only abnormal results are displayed) Labs Reviewed  BASIC METABOLIC PANEL WITH GFR - Abnormal; Notable for the following components:      Result Value   Sodium 133 (*)    Potassium 3.3 (*)    CO2 18 (*)    Glucose, Bld 144 (*)    Creatinine, Ser 1.18 (*)    Calcium  8.8 (*)    All other components within normal limits  CBC - Abnormal; Notable for the following components:   MCV 79.0 (*)    MCH 25.9 (*)    RDW 17.3 (*)    All other components within normal limits  URINALYSIS, W/ REFLEX TO CULTURE  (INFECTION SUSPECTED) - Abnormal; Notable for the following components:   APPearance CLOUDY (*)    Hgb urine dipstick LARGE (*)    Bilirubin Urine SMALL (*)    Protein, ur >300 (*)    Nitrite POSITIVE (*)    Leukocytes,Ua TRACE (*)    Bacteria, UA MANY (*)    All other components within normal limits  CULTURE, BLOOD (ROUTINE X 2)  CULTURE, BLOOD (ROUTINE X 2)  HCG, SERUM, QUALITATIVE  I-STAT CG4 LACTIC ACID, ED  I-STAT CG4 LACTIC ACID, ED    EKG None  Radiology CT Renal Stone Study Result Date: 11/28/2023 CLINICAL DATA:  Kidney stones EXAM: CT ABDOMEN AND PELVIS WITHOUT CONTRAST TECHNIQUE: Multidetector CT imaging of the abdomen and pelvis was performed following the standard protocol without IV contrast. RADIATION DOSE REDUCTION: This exam was performed according to the departmental dose-optimization program which includes automated exposure control, adjustment of the mA and/or kV according to patient size and/or use of iterative reconstruction technique. COMPARISON:  CT renal Nov 24, 2023 FINDINGS: Lower chest: No infiltrates or consolidations, no pleural effusions Hepatobiliary: Liver normal size no masses no biliary dilatation. Gallbladder unremarkable. No gallstones. Pancreas: Pancreas normal size. No masses calcifications or inflammatory changes. Spleen: Spleen normal size.  No masses. Adrenals/Urinary Tract: Adrenal glands are normal size. Follow-up recommended. Kidneys are normal. The right kidney appears slightly enlarged as compared to the left with mild prominence of the pelvicalyceal system and ureter. Although there is no significant dilatation of the ureter there is a 2.5 mm calcification in the trajectory of the right distal ureter at the ureterovesical junction perhaps related to the previously described stone in the right proximal ureter on the prior examination Nov 24, 2023. The left kidney unremarkable. Stomach/Bowel: No small or large bowel obstruction or inflammatory changes.  Moderate amount of residual fecal material throughout the colon without obstruction or constipation. Vascular/Lymphatic: No significant vascular findings are present. No enlarged abdominal or pelvic lymph nodes. Reproductive: Uterus and bilateral adnexa are unremarkable. Other: Anterior abdominal wall unremarkable without evidence of umbilical or inguinal hernias Musculoskeletal: Visualized portion of the thoracolumbar spine and pelvic structures grossly unremarkable without evidence of fracture bony abnormalities or soft tissue masses. IMPRESSION: The right kidney appears slightly enlarged as compared to the left with mild prominence of the pelvicalyceal system and ureter. Although there is no significant dilatation of the ureter there is a 2.5 mm calcification in the trajectory of the  right distal ureter at the ureterovesical junction perhaps related to the previously described stone in the right proximal ureter on the prior examination Nov 24, 2023. Electronically Signed   By: Fredrich Jefferson M.D.   On: 11/28/2023 12:04    Procedures Procedures    Medications Ordered in ED Medications  ondansetron  (ZOFRAN ) injection 4 mg (4 mg Intravenous Given 11/28/23 0931)  morphine  (PF) 4 MG/ML injection 4 mg (4 mg Intravenous Given 11/28/23 0932)  ketorolac  (TORADOL ) 30 MG/ML injection 15 mg (15 mg Intravenous Given 11/28/23 0931)  sodium chloride  0.9 % bolus 1,000 mL (0 mLs Intravenous Stopped 11/28/23 1122)  acetaminophen  (TYLENOL ) tablet 650 mg (650 mg Oral Given 11/28/23 0951)  HYDROmorphone  (DILAUDID ) injection 1 mg (1 mg Intravenous Given 11/28/23 1120)  metoCLOPramide  (REGLAN ) injection 10 mg (10 mg Intravenous Given 11/28/23 1325)  cefTRIAXone  (ROCEPHIN ) 2 g in sodium chloride  0.9 % 100 mL IVPB (0 g Intravenous Stopped 11/28/23 1534)  morphine  (PF) 4 MG/ML injection 4 mg (4 mg Intravenous Given 11/28/23 1435)    ED Course/ Medical Decision Making/ A&P Clinical Course as of 11/28/23 1539  Wed Nov 28, 2023  0952 No  evidence of urinary retention based on bladder scan [MP]  1526 CT shows some hydronephrosis of the right kidney.  No leukocytosis on labs.  Lactic less than 2.  Pregnancy negative.  UA consistent with urinary tract infection.  Patient has received ceftriaxone  here.  She feels better after several rounds of pain medication.  Discussed rationale for admission versus discharge home patient elects for discharge home with antibiotics at this time given that she has 3 children at home under the age of 1.  Will prescribe outpatient course of antibiotics to treat urinary tract infection/pyelonephritis and another few days of opioid analgesics as she only has 2 left [MP]    Clinical Course User Index [MP] Sallyanne Creamer, DO                                 Medical Decision Making 33 year old female with history as above presenting for right flank pain.  Seen a few days ago found to have a nonobstructive right-sided stone.  Unable to urinate since last night.  Concern now would be for obstructed stone and/or Pilo/hydro.  Will obtain laboratory workup including CBC metabolic panel UA.  Will provide IV fluids morphine  Toradol  for pain control bladder scan and Place Foley if she is retaining urine.  Given concern for newly obstructed stone will obtain another CT renal study once pregnancy test negative  Amount and/or Complexity of Data Reviewed Labs: ordered. Radiology: ordered.  Risk Prescription drug management.           Final Clinical Impression(s) / ED Diagnoses Final diagnoses:  Kidney stone  Urinary tract infection without hematuria, site unspecified    Rx / DC Orders ED Discharge Orders          Ordered    ciprofloxacin  (CIPRO ) 500 MG tablet  Every 12 hours        11/28/23 1537    oxyCODONE  (ROXICODONE ) 5 MG immediate release tablet  Every 8 hours PRN        11/28/23 1537              Sallyanne Creamer, DO 11/28/23 1539

## 2023-11-28 NOTE — ED Notes (Signed)
 Pt advised that urine sample is needed and cup given.  Pt is crying and hyperventilating in triage.  She states that she has not been able to void since last pm

## 2023-11-28 NOTE — Discharge Instructions (Addendum)
 You were seen in the emerged department for kidney stones We did see kidney stones but no evidence of obstruction You were able to urinate here It does look like you have a urinary tract infection For this reason we called in a antibiotic for your pharmacy We also gave you 2 more days worth of oxycodone  to help with severe pain Keep ibuprofen  in your system and take as directed every 6 hours Take oxycodone  only for severe pain Do not drink alcohol or drive while taking oxycodone  Take all the antibiotics as directed for the next 7 days Return to the emergency department if you are unable to urinate or have severe pain or any other concerns

## 2023-11-28 NOTE — ED Triage Notes (Signed)
 Pt is inconsolable in triage.   She states that she has severe right flank pain and thinks that she was dx with a kidney stone on 5/31 and she has been taking all medications as prescribed but feels worse and has body aches and fever.

## 2023-11-28 NOTE — ED Notes (Signed)
 Went to CT
